# Patient Record
Sex: Female | Born: 1945 | Race: Black or African American | Hispanic: No | Marital: Single | Smoking: Former smoker
Health system: Southern US, Community
[De-identification: ages and names within clinical notes are randomized; demographics above are authoritative.]

## PROBLEM LIST (undated history)

## (undated) DIAGNOSIS — Z86010 Personal history of colonic polyps: Secondary | ICD-10-CM

## (undated) DIAGNOSIS — T7840XA Allergy, unspecified, initial encounter: Secondary | ICD-10-CM

## (undated) DIAGNOSIS — M6282 Rhabdomyolysis: Secondary | ICD-10-CM

## (undated) DIAGNOSIS — M1990 Unspecified osteoarthritis, unspecified site: Secondary | ICD-10-CM

## (undated) DIAGNOSIS — I10 Essential (primary) hypertension: Secondary | ICD-10-CM

## (undated) DIAGNOSIS — R531 Weakness: Secondary | ICD-10-CM

## (undated) DIAGNOSIS — R269 Unspecified abnormalities of gait and mobility: Secondary | ICD-10-CM

## (undated) HISTORY — DX: Unspecified osteoarthritis, unspecified site: M19.90

## (undated) HISTORY — PX: BREAST EXCISIONAL BIOPSY: SUR124

## (undated) HISTORY — DX: Personal history of colonic polyps: Z86.010

## (undated) HISTORY — DX: Rhabdomyolysis: M62.82

## (undated) HISTORY — PX: PARTIAL HYSTERECTOMY: SHX80

## (undated) HISTORY — DX: Unspecified abnormalities of gait and mobility: R26.9

## (undated) HISTORY — DX: Allergy, unspecified, initial encounter: T78.40XA

## (undated) HISTORY — PX: FOOT SURGERY: SHX648

## (undated) HISTORY — PX: COLONOSCOPY: SHX174

## (undated) HISTORY — DX: Essential (primary) hypertension: I10

## (undated) HISTORY — DX: Weakness: R53.1

---

## 1974-10-29 HISTORY — PX: BREAST BIOPSY: SHX20

## 1998-01-07 ENCOUNTER — Ambulatory Visit (HOSPITAL_COMMUNITY): Admission: RE | Admit: 1998-01-07 | Discharge: 1998-01-07 | Payer: Self-pay | Admitting: Emergency Medicine

## 1999-02-03 ENCOUNTER — Ambulatory Visit (HOSPITAL_COMMUNITY): Admission: RE | Admit: 1999-02-03 | Discharge: 1999-02-03 | Payer: Self-pay | Admitting: Emergency Medicine

## 1999-02-03 ENCOUNTER — Encounter: Payer: Self-pay | Admitting: Emergency Medicine

## 2000-02-07 ENCOUNTER — Ambulatory Visit (HOSPITAL_COMMUNITY): Admission: RE | Admit: 2000-02-07 | Discharge: 2000-02-07 | Payer: Self-pay | Admitting: Emergency Medicine

## 2000-02-07 ENCOUNTER — Encounter: Payer: Self-pay | Admitting: Emergency Medicine

## 2002-03-19 ENCOUNTER — Encounter: Admission: RE | Admit: 2002-03-19 | Discharge: 2002-03-19 | Payer: Self-pay | Admitting: Emergency Medicine

## 2002-03-19 ENCOUNTER — Encounter: Payer: Self-pay | Admitting: Emergency Medicine

## 2003-06-28 ENCOUNTER — Encounter: Payer: Self-pay | Admitting: Emergency Medicine

## 2003-06-28 ENCOUNTER — Encounter: Admission: RE | Admit: 2003-06-28 | Discharge: 2003-06-28 | Payer: Self-pay | Admitting: Emergency Medicine

## 2004-10-18 ENCOUNTER — Encounter: Admission: RE | Admit: 2004-10-18 | Discharge: 2004-10-18 | Payer: Self-pay | Admitting: Emergency Medicine

## 2005-10-24 ENCOUNTER — Encounter: Admission: RE | Admit: 2005-10-24 | Discharge: 2005-10-24 | Payer: Self-pay | Admitting: Emergency Medicine

## 2006-10-25 ENCOUNTER — Encounter: Admission: RE | Admit: 2006-10-25 | Discharge: 2006-10-25 | Payer: Self-pay | Admitting: Emergency Medicine

## 2007-10-27 ENCOUNTER — Encounter: Admission: RE | Admit: 2007-10-27 | Discharge: 2007-10-27 | Payer: Self-pay | Admitting: Emergency Medicine

## 2007-11-15 ENCOUNTER — Emergency Department (HOSPITAL_COMMUNITY): Admission: EM | Admit: 2007-11-15 | Discharge: 2007-11-15 | Payer: Self-pay | Admitting: Family Medicine

## 2007-11-16 ENCOUNTER — Emergency Department (HOSPITAL_COMMUNITY): Admission: EM | Admit: 2007-11-16 | Discharge: 2007-11-16 | Payer: Self-pay | Admitting: Emergency Medicine

## 2008-10-27 ENCOUNTER — Encounter: Admission: RE | Admit: 2008-10-27 | Discharge: 2008-10-27 | Payer: Self-pay | Admitting: Emergency Medicine

## 2009-02-25 ENCOUNTER — Encounter (INDEPENDENT_AMBULATORY_CARE_PROVIDER_SITE_OTHER): Payer: Self-pay | Admitting: *Deleted

## 2009-04-11 ENCOUNTER — Ambulatory Visit: Payer: Self-pay | Admitting: Internal Medicine

## 2009-04-25 ENCOUNTER — Ambulatory Visit: Payer: Self-pay | Admitting: Internal Medicine

## 2009-04-28 ENCOUNTER — Encounter: Payer: Self-pay | Admitting: Internal Medicine

## 2009-10-31 ENCOUNTER — Encounter: Admission: RE | Admit: 2009-10-31 | Discharge: 2009-10-31 | Payer: Self-pay | Admitting: Internal Medicine

## 2010-11-06 ENCOUNTER — Encounter
Admission: RE | Admit: 2010-11-06 | Discharge: 2010-11-06 | Payer: Self-pay | Source: Home / Self Care | Attending: Internal Medicine | Admitting: Internal Medicine

## 2011-10-03 ENCOUNTER — Other Ambulatory Visit: Payer: Self-pay | Admitting: Internal Medicine

## 2011-10-03 DIAGNOSIS — Z1231 Encounter for screening mammogram for malignant neoplasm of breast: Secondary | ICD-10-CM

## 2011-11-08 ENCOUNTER — Ambulatory Visit: Payer: Self-pay

## 2011-11-13 ENCOUNTER — Other Ambulatory Visit: Payer: Self-pay | Admitting: Obstetrics and Gynecology

## 2011-11-16 ENCOUNTER — Ambulatory Visit
Admission: RE | Admit: 2011-11-16 | Discharge: 2011-11-16 | Disposition: A | Discharge status: Home / Self Care | Payer: Medicare HMO | Source: Ambulatory Visit | Attending: Internal Medicine | Admitting: Internal Medicine

## 2011-11-16 DIAGNOSIS — Z1231 Encounter for screening mammogram for malignant neoplasm of breast: Secondary | ICD-10-CM

## 2012-10-15 ENCOUNTER — Other Ambulatory Visit: Payer: Self-pay | Admitting: Internal Medicine

## 2012-10-15 DIAGNOSIS — Z1231 Encounter for screening mammogram for malignant neoplasm of breast: Secondary | ICD-10-CM

## 2012-11-07 ENCOUNTER — Other Ambulatory Visit: Payer: Self-pay | Admitting: Internal Medicine

## 2012-11-07 DIAGNOSIS — N951 Menopausal and female climacteric states: Secondary | ICD-10-CM

## 2012-11-18 ENCOUNTER — Ambulatory Visit: Payer: Medicare HMO

## 2012-12-05 ENCOUNTER — Ambulatory Visit
Admission: RE | Admit: 2012-12-05 | Discharge: 2012-12-05 | Disposition: A | Discharge status: Home / Self Care | Payer: Medicare PPO | Source: Ambulatory Visit | Attending: Internal Medicine | Admitting: Internal Medicine

## 2012-12-05 DIAGNOSIS — Z1231 Encounter for screening mammogram for malignant neoplasm of breast: Secondary | ICD-10-CM

## 2012-12-05 DIAGNOSIS — N951 Menopausal and female climacteric states: Secondary | ICD-10-CM

## 2013-11-11 ENCOUNTER — Other Ambulatory Visit: Payer: Self-pay

## 2013-11-11 DIAGNOSIS — Z1231 Encounter for screening mammogram for malignant neoplasm of breast: Secondary | ICD-10-CM

## 2013-12-07 ENCOUNTER — Ambulatory Visit: Payer: Medicare PPO

## 2013-12-17 ENCOUNTER — Ambulatory Visit
Admission: RE | Admit: 2013-12-17 | Discharge: 2013-12-17 | Disposition: A | Discharge status: Home / Self Care | Payer: Commercial Managed Care - HMO | Source: Ambulatory Visit

## 2013-12-17 DIAGNOSIS — Z1231 Encounter for screening mammogram for malignant neoplasm of breast: Secondary | ICD-10-CM

## 2014-06-02 ENCOUNTER — Encounter: Payer: Self-pay | Admitting: Internal Medicine

## 2014-06-28 ENCOUNTER — Encounter: Payer: Self-pay | Admitting: Internal Medicine

## 2014-09-08 ENCOUNTER — Ambulatory Visit (AMBULATORY_SURGERY_CENTER): Payer: Self-pay | Admitting: *Deleted

## 2014-09-08 VITALS — Ht 60.0 in | Wt 172.2 lb

## 2014-09-08 DIAGNOSIS — Z860100 Personal history of colon polyps, unspecified: Secondary | ICD-10-CM

## 2014-09-08 NOTE — Progress Notes (Signed)
No egg or soy allergy. ewm No diet pills. ewm  No blood thinners. ewm No issues with past sedation. ewm

## 2014-09-20 ENCOUNTER — Encounter: Payer: Self-pay | Admitting: Internal Medicine

## 2014-09-20 ENCOUNTER — Ambulatory Visit (AMBULATORY_SURGERY_CENTER): Payer: Commercial Managed Care - HMO | Admitting: Internal Medicine

## 2014-09-20 VITALS — BP 131/95 | HR 62 | Temp 98.9°F | Resp 14 | Ht 60.0 in | Wt 172.0 lb

## 2014-09-20 DIAGNOSIS — Z860100 Personal history of colon polyps, unspecified: Secondary | ICD-10-CM

## 2014-09-20 DIAGNOSIS — D120 Benign neoplasm of cecum: Secondary | ICD-10-CM

## 2014-09-20 DIAGNOSIS — K5730 Diverticulosis of large intestine without perforation or abscess without bleeding: Secondary | ICD-10-CM

## 2014-09-20 DIAGNOSIS — D122 Benign neoplasm of ascending colon: Secondary | ICD-10-CM

## 2014-09-20 DIAGNOSIS — D125 Benign neoplasm of sigmoid colon: Secondary | ICD-10-CM

## 2014-09-20 DIAGNOSIS — D124 Benign neoplasm of descending colon: Secondary | ICD-10-CM

## 2014-09-20 DIAGNOSIS — D123 Benign neoplasm of transverse colon: Secondary | ICD-10-CM

## 2014-09-20 DIAGNOSIS — K635 Polyp of colon: Secondary | ICD-10-CM

## 2014-09-20 MED ORDER — SODIUM CHLORIDE 0.9 % IV SOLN: 500.0000 mL | INTRAVENOUS | Status: DC

## 2014-09-20 NOTE — Progress Notes (Signed)
Report to PACU, RN, vss, BBS= Clear.  

## 2014-09-20 NOTE — Op Note (Signed)
[facility]  [address] [place], [address]   COLONOSCOPY PROCEDURE REPORT  PATIENT: [name], Landing  MR#: [id number] BIRTHDATE: 04/01/1946 , 53  yrs. old GENDER: female ENDOSCOPIST: Gatha Mayer, MD, [facility] PROCEDURE DATE:  09/20/2014 PROCEDURE:   Colonoscopy with biopsy and Colonoscopy with snare polypectomy First Screening Colonoscopy - Avg.  risk and is 50 yrs.  old or older - No.  Prior Negative Screening - Now for repeat screening. N/A  History of Adenoma - Now for follow-up colonoscopy & has been > or = to 3 yrs.  N/A  Polyps Removed Today? Yes. ASA CLASS:   Class II INDICATIONS:high risk personal history of colonic polyps. MEDICATIONS: Propofol 200 mg IV and Monitored anesthesia care  DESCRIPTION OF PROCEDURE:   After the risks benefits and alternatives of the procedure were thoroughly explained, informed consent was obtained.  The digital rectal exam revealed no abnormalities of the rectum.   The LB PFC-H190 [id number]  endoscope was introduced through the anus and advanced to the cecum, which was identified by both the appendix and ileocecal valve. No adverse events experienced.   The quality of the prep was good, using MiraLax  The instrument was then slowly withdrawn as the colon was fully examined.  COLON FINDINGS: 1) 5 polyps (4 sessile) completely removed: 3 mm cecal polyp w/ cold forceps; 5 mm ascending with cold snare; 7 mm transverse cold snare; 5 mm descending cold snare and 1 cm sigmoid (pedunculated) hot snare.  All sent to pathology.  2) Severe diverticulosis left>right colon.3) Otherwise normal colonoscopy. Retroflexed views revealed no abnormalities. The time to cecum=3 minutes 48 seconds.  Withdrawal time=16 minutes 46 seconds.  The scope was withdrawn and the procedure completed. COMPLICATIONS: There were no immediate complications.  ENDOSCOPIC IMPRESSION: 1) 5 polyps (4 sessile) completely removed: 3 mm cecal polyp w/ cold forceps;  5 mm ascending with cold snare; 7 mm transverse cold snare; 5 mm descending cold snare and 1 cm sigmoid (pedunculated) hot snare. 2) Severe diverticulosis left>right colon. 3) Otherwise normal colonoscopy - good prep    RECOMMENDATIONS: 1.  Hold Aspirin and all other NSAIDS for 2 weeks. 2.  Timing of repeat colonoscopy will be determined by pathology findings.  eSigned:  Gatha Mayer, MD, [facility] 09/20/2014 9:37 AM   cc: The Patient and Maudry Mayhew, MD   PATIENT NAME:  Diane Snow, Diane Snow MR#: [id number]

## 2014-09-20 NOTE — Progress Notes (Signed)
Pt. States she wants to go to work tonight at 2100.  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  Offered work note, she declined.

## 2014-09-20 NOTE — Patient Instructions (Signed)
YOU HAD AN ENDOSCOPIC PROCEDURE TODAY AT THE [facility]: Refer to the procedure report that was given to you for any specific questions about what was found during the examination.  If the procedure report does not answer your questions, please call your gastroenterologist to clarify.  If you requested that your care partner not be given the details of your procedure findings, then the procedure report has been included in a sealed envelope for you to review at your convenience later.  YOU SHOULD EXPECT: Some feelings of bloating in the abdomen. Passage of more gas than usual.  Walking can help get rid of the air that was put into your GI tract during the procedure and reduce the bloating. If you had a lower endoscopy (such as a colonoscopy or flexible sigmoidoscopy) you may notice spotting of blood in your stool or on the toilet paper. If you underwent a bowel prep for your procedure, then you may not have a normal bowel movement for a few days.  DIET: Your first meal following the procedure should be a light meal and then it is ok to progress to your normal diet.  A half-sandwich or bowl of soup is an example of a good first meal.  Heavy or fried foods are harder to digest and may make you feel nauseous or bloated.  Likewise meals heavy in dairy and vegetables can cause extra gas to form and this can also increase the bloating.  Drink plenty of fluids but you should avoid alcoholic beverages for 24 hours.  ACTIVITY: Your care partner should take you home directly after the procedure.  You should plan to take it easy, moving slowly for the rest of the day.  You can resume normal activity the day after the procedure however you should NOT DRIVE or use heavy machinery for 24 hours (because of the sedation medicines used during the test).    SYMPTOMS TO REPORT IMMEDIATELY: A gastroenterologist can be reached at any hour.  During normal business hours, 8:30 AM to 5:00 PM Monday through Friday,  call [phone].  After hours and on weekends, please call the GI answering service at [phone] who will take a message and have the physician on call contact you.   Following lower endoscopy (colonoscopy or flexible sigmoidoscopy):  Excessive amounts of blood in the stool  Significant tenderness or worsening of abdominal pains  Swelling of the abdomen that is new, acute  Fever of 100F or higher  Following upper endoscopy (EGD)  Vomiting of blood or coffee ground material  New chest pain or pain under the shoulder blades  Painful or persistently difficult swallowing  New shortness of breath  Fever of 100F or higher  Black, tarry-looking stools  FOLLOW UP: If any biopsies were taken you will be contacted by phone or by letter within the next 1-3 weeks.  Call your gastroenterologist if you have not heard about the biopsies in 3 weeks.  Our staff will call the home number listed on your records the next business day following your procedure to check on you and address any questions or concerns that you may have at that time regarding the information given to you following your procedure. This is a courtesy call and so if there is no answer at the home number and we have not heard from you through the emergency physician on call, we will assume that you have returned to your regular daily activities without incident.  SIGNATURES/CONFIDENTIALITY: You and/or your care   partner have signed paperwork which will be entered into your electronic medical record.  These signatures attest to the fact that that the information above on your After Visit Summary has been reviewed and is understood.  Full responsibility of the confidentiality of this discharge information lies with you and/or your care-partner.YOU HAD AN ENDOSCOPIC PROCEDURE TODAY AT [facility]: Refer to the procedure report that was given to you for any specific questions about what was found during the examination.   If the procedure report does not answer your questions, please call your gastroenterologist to clarify.  If you requested that your care partner not be given the details of your procedure findings, then the procedure report has been included in a sealed envelope for you to review at your convenience later.  YOU SHOULD EXPECT: Some feelings of bloating in the abdomen. Passage of more gas than usual.  Walking can help get rid of the air that was put into your GI tract during the procedure and reduce the bloating. If you had a lower endoscopy (such as a colonoscopy or flexible sigmoidoscopy) you may notice spotting of blood in your stool or on the toilet paper. If you underwent a bowel prep for your procedure, then you may not have a normal bowel movement for a few days.  DIET: Your first meal following the procedure should be a light meal and then it is ok to progress to your normal diet.  A half-sandwich or bowl of soup is an example of a good first meal.  Heavy or fried foods are harder to digest and may make you feel nauseous or bloated.  Likewise meals heavy in dairy and vegetables can cause extra gas to form and this can also increase the bloating.  Drink plenty of fluids but you should avoid alcoholic beverages for 24 hours.  ACTIVITY: Your care partner should take you home directly after the procedure.  You should plan to take it easy, moving slowly for the rest of the day.  You can resume normal activity the day after the procedure however you should NOT DRIVE or use heavy machinery for 24 hours (because of the sedation medicines used during the test).    SYMPTOMS TO REPORT IMMEDIATELY: A gastroenterologist can be reached at any hour.  During normal business hours, 8:30 AM to 5:00 PM Monday through Friday, call [phone].  After hours and on weekends, please call the GI answering service at [phone] who will take a message and have the physician on call contact you.   Following lower  endoscopy (colonoscopy or flexible sigmoidoscopy):  Excessive amounts of blood in the stool  Significant tenderness or worsening of abdominal pains  Swelling of the abdomen that is new, acute  Fever of 100F or higher   FOLLOW UP: If any biopsies were taken you will be contacted by phone or by letter within the next 1-3 weeks.  Call your gastroenterologist if you have not heard about the biopsies in 3 weeks.  Our staff will call the home number listed on your records the next business day following your procedure to check on you and address any questions or concerns that you may have at that time regarding the information given to you following your procedure. This is a courtesy call and so if there is no answer at the home number and we have not heard from you through the emergency physician on call, we will assume that you have returned to your regular daily  activities without incident.  SIGNATURES/CONFIDENTIALITY: You and/or your care partner have signed paperwork which will be entered into your electronic medical record.  These signatures attest to the fact that that the information above on your After Visit Summary has been reviewed and is understood.  Full responsibility of the confidentiality of this discharge information lies with you and/or your care-partner.  Polyp, diverticulosis information given.  Hold all non steroidal anti-inflammatory medications for 2 weeks.

## 2014-09-20 NOTE — Progress Notes (Signed)
Called to room to assist during endoscopic procedure.  Patient ID and intended procedure confirmed with present staff. Received instructions for my participation in the procedure from the performing physician.  

## 2014-09-21 ENCOUNTER — Telehealth: Payer: Self-pay | Admitting: *Deleted

## 2014-09-21 NOTE — Telephone Encounter (Signed)
  Follow up Call-  Call back number 09/20/2014  Post procedure Call Back phone  # [phone] hm  Permission to leave phone message Yes     Patient questions:  Do you have a fever, pain , or abdominal swelling? No. Pain Score  0 *  Have you tolerated food without any problems? Yes.    Have you been able to return to your normal activities? Yes.    Do you have any questions about your discharge instructions: Diet   No. Medications  No. Follow up visit  No.  Do you have questions or concerns about your Care? No.  Actions: * If pain score is 4 or above: No action needed, pain <4.

## 2014-09-27 ENCOUNTER — Encounter: Payer: Self-pay | Admitting: Internal Medicine

## 2014-09-27 DIAGNOSIS — Z860101 Personal history of adenomatous and serrated colon polyps: Secondary | ICD-10-CM

## 2014-09-27 DIAGNOSIS — Z86010 Personal history of colonic polyps: Secondary | ICD-10-CM

## 2014-09-27 HISTORY — DX: Personal history of colonic polyps: Z86.010

## 2014-09-27 HISTORY — DX: Personal history of adenomatous and serrated colon polyps: Z86.0101

## 2014-09-27 NOTE — Progress Notes (Signed)
Quick Note:  3 small adenomas - repeat colonoscopy 2018 ______

## 2014-11-18 DIAGNOSIS — I10 Essential (primary) hypertension: Secondary | ICD-10-CM | POA: Diagnosis not present

## 2014-11-18 DIAGNOSIS — E785 Hyperlipidemia, unspecified: Secondary | ICD-10-CM | POA: Diagnosis not present

## 2014-11-18 DIAGNOSIS — Z23 Encounter for immunization: Secondary | ICD-10-CM | POA: Diagnosis not present

## 2014-11-18 DIAGNOSIS — E139 Other specified diabetes mellitus without complications: Secondary | ICD-10-CM | POA: Diagnosis not present

## 2014-11-18 DIAGNOSIS — Z1389 Encounter for screening for other disorder: Secondary | ICD-10-CM | POA: Diagnosis not present

## 2014-11-18 DIAGNOSIS — Z0000 Encounter for general adult medical examination without abnormal findings: Secondary | ICD-10-CM | POA: Diagnosis not present

## 2014-11-18 DIAGNOSIS — K5790 Diverticulosis of intestine, part unspecified, without perforation or abscess without bleeding: Secondary | ICD-10-CM | POA: Diagnosis not present

## 2014-12-01 ENCOUNTER — Other Ambulatory Visit: Payer: Self-pay

## 2014-12-01 DIAGNOSIS — Z1231 Encounter for screening mammogram for malignant neoplasm of breast: Secondary | ICD-10-CM

## 2014-12-10 ENCOUNTER — Encounter (INDEPENDENT_AMBULATORY_CARE_PROVIDER_SITE_OTHER): Payer: Self-pay

## 2014-12-10 ENCOUNTER — Ambulatory Visit
Admission: RE | Admit: 2014-12-10 | Discharge: 2014-12-10 | Disposition: A | Discharge status: Home / Self Care | Payer: Commercial Managed Care - HMO | Source: Ambulatory Visit

## 2014-12-10 DIAGNOSIS — Z1231 Encounter for screening mammogram for malignant neoplasm of breast: Secondary | ICD-10-CM

## 2015-03-10 DIAGNOSIS — N909 Noninflammatory disorder of vulva and perineum, unspecified: Secondary | ICD-10-CM | POA: Diagnosis not present

## 2015-03-29 DIAGNOSIS — R05 Cough: Secondary | ICD-10-CM | POA: Diagnosis not present

## 2015-04-28 ENCOUNTER — Other Ambulatory Visit: Payer: Self-pay | Admitting: Nurse Practitioner

## 2015-04-28 DIAGNOSIS — N909 Noninflammatory disorder of vulva and perineum, unspecified: Secondary | ICD-10-CM | POA: Diagnosis not present

## 2015-04-28 DIAGNOSIS — L110 Acquired keratosis follicularis: Secondary | ICD-10-CM | POA: Diagnosis not present

## 2015-05-12 DIAGNOSIS — N907 Vulvar cyst: Secondary | ICD-10-CM | POA: Diagnosis not present

## 2015-05-19 DIAGNOSIS — E876 Hypokalemia: Secondary | ICD-10-CM | POA: Diagnosis not present

## 2015-05-19 DIAGNOSIS — I10 Essential (primary) hypertension: Secondary | ICD-10-CM | POA: Diagnosis not present

## 2015-05-19 DIAGNOSIS — E139 Other specified diabetes mellitus without complications: Secondary | ICD-10-CM | POA: Diagnosis not present

## 2015-05-19 DIAGNOSIS — E785 Hyperlipidemia, unspecified: Secondary | ICD-10-CM | POA: Diagnosis not present

## 2015-05-30 DIAGNOSIS — E876 Hypokalemia: Secondary | ICD-10-CM | POA: Diagnosis not present

## 2015-09-21 DIAGNOSIS — H5210 Myopia, unspecified eye: Secondary | ICD-10-CM | POA: Diagnosis not present

## 2015-09-21 DIAGNOSIS — H524 Presbyopia: Secondary | ICD-10-CM | POA: Diagnosis not present

## 2015-09-29 ENCOUNTER — Encounter: Payer: Self-pay | Admitting: Internal Medicine

## 2015-11-16 ENCOUNTER — Encounter: Payer: Self-pay | Admitting: Internal Medicine

## 2015-11-16 ENCOUNTER — Ambulatory Visit (INDEPENDENT_AMBULATORY_CARE_PROVIDER_SITE_OTHER): Payer: Self-pay | Admitting: Internal Medicine

## 2015-11-16 DIAGNOSIS — L0292 Furuncle, unspecified: Secondary | ICD-10-CM

## 2015-11-16 DIAGNOSIS — I10 Essential (primary) hypertension: Secondary | ICD-10-CM | POA: Insufficient documentation

## 2015-11-16 MED ORDER — CEPHALEXIN 500 MG PO TABS: 500.0000 mg | ORAL_TABLET | Freq: Two times a day (BID) | ORAL | Status: DC

## 2015-11-16 NOTE — Progress Notes (Addendum)
   Subjective:    Patient ID: Diane Snow, female    DOB: [birth year], 70 y.o.   MRN: [id number]  HPI  Started with blackhead on vermilion border, which she was squeezing and messing with about 1 week ago.  This morning, awakened with a 4 mm pustule and some surrounding swelling.  Would like to have the pustule opened and drained.   Not allergic to antibiotics No fever.    Review of Systems     Objective:   Physical Exam  NAD HEENT:  PERRL, EOMI, TMs pearly gray, throat without injection. No gingival swelling. 2mm white pustule with thick roof and mild surrounding swelling and  erythema, right lower lip near right corner of mouth. Area cleaned with alcohol wipe, sterile 18 gauge needle used to unroof pustule with initial drainage of thin white pus and a bit of blood.  With a bit of pressure, a large white plug of thickened pus extracted.  Pt. Tolerated well without complication.  Neck:  Supple, no adenopathy, no thyromegaly         Assessment & Plan:  Small furuncle, right lower lip Warm compresses. If not significantly better with warm compresses and continued drainage, to get Cephalexin 500 mg twice daily filled and take for 5 days.

## 2015-11-16 NOTE — Patient Instructions (Signed)
Warm compresses to right lower lip every 2-4 hour for 20 minutes, If not significant improvement in 24 hours or increased swelling and pain, redness, get started on antibiotic sent into [facility] Call if any concerns.

## 2015-11-18 ENCOUNTER — Other Ambulatory Visit: Payer: Self-pay

## 2015-11-18 DIAGNOSIS — Z1231 Encounter for screening mammogram for malignant neoplasm of breast: Secondary | ICD-10-CM

## 2015-11-24 DIAGNOSIS — Z1389 Encounter for screening for other disorder: Secondary | ICD-10-CM | POA: Diagnosis not present

## 2015-11-24 DIAGNOSIS — I10 Essential (primary) hypertension: Secondary | ICD-10-CM | POA: Diagnosis not present

## 2015-11-24 DIAGNOSIS — R29898 Other symptoms and signs involving the musculoskeletal system: Secondary | ICD-10-CM | POA: Diagnosis not present

## 2015-11-24 DIAGNOSIS — Z0000 Encounter for general adult medical examination without abnormal findings: Secondary | ICD-10-CM | POA: Diagnosis not present

## 2015-11-24 DIAGNOSIS — E139 Other specified diabetes mellitus without complications: Secondary | ICD-10-CM | POA: Diagnosis not present

## 2015-11-24 DIAGNOSIS — E785 Hyperlipidemia, unspecified: Secondary | ICD-10-CM | POA: Diagnosis not present

## 2015-11-29 DIAGNOSIS — Z01419 Encounter for gynecological examination (general) (routine) without abnormal findings: Secondary | ICD-10-CM | POA: Diagnosis not present

## 2015-12-15 ENCOUNTER — Ambulatory Visit
Admission: RE | Admit: 2015-12-15 | Discharge: 2015-12-15 | Disposition: A | Discharge status: Home / Self Care | Payer: Commercial Managed Care - HMO | Source: Ambulatory Visit

## 2015-12-15 DIAGNOSIS — Z1231 Encounter for screening mammogram for malignant neoplasm of breast: Secondary | ICD-10-CM

## 2016-05-24 DIAGNOSIS — I10 Essential (primary) hypertension: Secondary | ICD-10-CM | POA: Diagnosis not present

## 2016-05-24 DIAGNOSIS — E139 Other specified diabetes mellitus without complications: Secondary | ICD-10-CM | POA: Diagnosis not present

## 2016-05-24 DIAGNOSIS — E785 Hyperlipidemia, unspecified: Secondary | ICD-10-CM | POA: Diagnosis not present

## 2016-07-23 DIAGNOSIS — L219 Seborrheic dermatitis, unspecified: Secondary | ICD-10-CM | POA: Diagnosis not present

## 2016-08-20 DIAGNOSIS — L219 Seborrheic dermatitis, unspecified: Secondary | ICD-10-CM | POA: Diagnosis not present

## 2016-08-20 DIAGNOSIS — L821 Other seborrheic keratosis: Secondary | ICD-10-CM | POA: Diagnosis not present

## 2016-08-20 DIAGNOSIS — L304 Erythema intertrigo: Secondary | ICD-10-CM | POA: Diagnosis not present

## 2016-11-15 ENCOUNTER — Other Ambulatory Visit: Payer: Self-pay | Admitting: Internal Medicine

## 2016-11-15 DIAGNOSIS — Z1231 Encounter for screening mammogram for malignant neoplasm of breast: Secondary | ICD-10-CM

## 2016-11-16 DIAGNOSIS — H524 Presbyopia: Secondary | ICD-10-CM | POA: Diagnosis not present

## 2016-12-04 DIAGNOSIS — E785 Hyperlipidemia, unspecified: Secondary | ICD-10-CM | POA: Diagnosis not present

## 2016-12-04 DIAGNOSIS — J069 Acute upper respiratory infection, unspecified: Secondary | ICD-10-CM | POA: Diagnosis not present

## 2016-12-04 DIAGNOSIS — Z0000 Encounter for general adult medical examination without abnormal findings: Secondary | ICD-10-CM | POA: Diagnosis not present

## 2016-12-04 DIAGNOSIS — E139 Other specified diabetes mellitus without complications: Secondary | ICD-10-CM | POA: Diagnosis not present

## 2016-12-04 DIAGNOSIS — I10 Essential (primary) hypertension: Secondary | ICD-10-CM | POA: Diagnosis not present

## 2016-12-04 DIAGNOSIS — Z1389 Encounter for screening for other disorder: Secondary | ICD-10-CM | POA: Diagnosis not present

## 2016-12-17 ENCOUNTER — Ambulatory Visit: Payer: Commercial Managed Care - HMO

## 2016-12-17 ENCOUNTER — Ambulatory Visit
Admission: RE | Admit: 2016-12-17 | Discharge: 2016-12-17 | Disposition: A | Discharge status: Home / Self Care | Payer: Commercial Managed Care - HMO | Source: Ambulatory Visit | Attending: Internal Medicine | Admitting: Internal Medicine

## 2016-12-17 DIAGNOSIS — Z1231 Encounter for screening mammogram for malignant neoplasm of breast: Secondary | ICD-10-CM

## 2017-06-20 DIAGNOSIS — M545 Low back pain: Secondary | ICD-10-CM | POA: Diagnosis not present

## 2017-06-20 DIAGNOSIS — I10 Essential (primary) hypertension: Secondary | ICD-10-CM | POA: Diagnosis not present

## 2017-06-20 DIAGNOSIS — E785 Hyperlipidemia, unspecified: Secondary | ICD-10-CM | POA: Diagnosis not present

## 2017-06-20 DIAGNOSIS — R7301 Impaired fasting glucose: Secondary | ICD-10-CM | POA: Diagnosis not present

## 2017-07-22 ENCOUNTER — Ambulatory Visit (HOSPITAL_COMMUNITY)
Admission: EM | Admit: 2017-07-22 | Discharge: 2017-07-22 | Disposition: A | Discharge status: Home / Self Care | Payer: Commercial Managed Care - HMO | Attending: Urgent Care | Admitting: Urgent Care

## 2017-07-22 ENCOUNTER — Encounter (HOSPITAL_COMMUNITY): Payer: Self-pay | Admitting: Emergency Medicine

## 2017-07-22 DIAGNOSIS — W19XXXA Unspecified fall, initial encounter: Secondary | ICD-10-CM

## 2017-07-22 DIAGNOSIS — S0990XA Unspecified injury of head, initial encounter: Secondary | ICD-10-CM | POA: Diagnosis not present

## 2017-07-22 NOTE — Discharge Instructions (Signed)
You may take 500mg Tylenol every 6 hours for pain and inflammation. ° °

## 2017-07-22 NOTE — ED Triage Notes (Signed)
PT reports she was standing on loose rocks and they moved under her feet causing her to fall backwards. PT reports no pain anywhere. PT does not take a blood thinner. PT had no LOC

## 2017-07-23 NOTE — ED Provider Notes (Signed)
    MRN: [id number] DOB: Jun 19, 1946  Subjective:   Diane Snow is a 71 y.o. female presenting for chief complaint of Fall  Reports suffering a fall yesterday by slipping from large wet rocks. States that she made impact with the back of her head and wants to make sure she is okay. Denies loss of consciousness, dizziness, confusion, head pain, laceration, bleeding, neck pain, blurred vision, n/v, abdominal pain, swelling. She has not tried any medications for relief.   No current facility-administered medications for this encounter.   Current Outpatient Prescriptions:  .  amLODipine (NORVASC) 10 MG tablet, Take 10 mg by mouth daily., Disp: , Rfl:  .  aspirin 81 MG tablet, Take 81 mg by mouth daily., Disp: , Rfl:  .  calcium carbonate (TUMS - DOSED IN MG ELEMENTAL CALCIUM) 500 MG chewable tablet, Chew 1 tablet by mouth daily., Disp: , Rfl:  .  Cephalexin 500 MG tablet, Take 1 tablet (500 mg total) by mouth 2 (two) times daily with a meal., Disp: 10 tablet, Rfl: 0 .  famotidine (PEPCID) 20 MG tablet, Take 20 mg by mouth 2 (two) times daily., Disp: , Rfl:  .  indapamide (LOZOL) 1.25 MG tablet, Take 1.25 mg by mouth daily. Reported on 11/16/2015, Disp: , Rfl:    [name] is allergic to aspirin.  Saraiya  has a past medical history of Allergy; Arthritis; adenomatous colonic polyps (09/27/2014); and Hypertension. Also  has a past surgical history that includes Colonoscopy; Partial hysterectomy; and Breast biopsy (Left, 1976).  Objective:   Vitals: BP (!) 161/72   Pulse 61   Temp 98.4 F (36.9 C) (Oral)   Resp 16   Ht 5' (1.524 m)   Wt 167 lb (75.8 kg)   SpO2 100%   BMI 32.61 kg/m   Physical Exam  Constitutional: She is oriented to person, place, and time. She appears well-developed and well-nourished.  HENT:  Head: Head is without raccoon's eyes, without Battle's sign, without abrasion, without contusion and without laceration. Hair is normal.  Mouth/Throat: Oropharynx is clear and moist.   Eyes: Pupils are equal, round, and reactive to light. EOM are normal. Right eye exhibits no discharge. Left eye exhibits no discharge.  Neck: Normal range of motion. Neck supple.  Cardiovascular: Normal rate, regular rhythm and intact distal pulses.  Exam reveals no gallop and no friction rub.   No murmur heard. Pulmonary/Chest: No respiratory distress. She has no wheezes. She has no rales.  Musculoskeletal: She exhibits no edema.       Cervical back: She exhibits normal range of motion, no tenderness, no bony tenderness, no swelling, no edema, no deformity, no laceration and no spasm.  Lymphadenopathy:    She has no cervical adenopathy.  Neurological: She is alert and oriented to person, place, and time. She displays normal reflexes. No cranial nerve deficit. Coordination normal.  Speech intact. Negative Romberg and Pronator Drift. Strength 5/5.  Skin: Skin is warm and dry.  Psychiatric: She has a normal mood and affect.   Assessment and Plan :   Fall, initial encounter  Minor head injury, initial encounter  Recommended conservative management. Return-to-clinic precautions discussed, patient verbalized understanding.   Jaynee Eagles, PA-C [facility]  07/23/2017  1:05 PM    Jaynee Eagles, PA-C 07/23/17 1309

## 2017-10-25 ENCOUNTER — Encounter: Payer: Self-pay | Admitting: Internal Medicine

## 2017-11-20 DIAGNOSIS — H52223 Regular astigmatism, bilateral: Secondary | ICD-10-CM | POA: Diagnosis not present

## 2017-11-25 DIAGNOSIS — Z0100 Encounter for examination of eyes and vision without abnormal findings: Secondary | ICD-10-CM | POA: Diagnosis not present

## 2017-12-06 ENCOUNTER — Encounter: Payer: Self-pay | Admitting: Internal Medicine

## 2017-12-06 ENCOUNTER — Other Ambulatory Visit: Payer: Self-pay | Admitting: Internal Medicine

## 2017-12-06 DIAGNOSIS — Z139 Encounter for screening, unspecified: Secondary | ICD-10-CM

## 2017-12-10 DIAGNOSIS — E7800 Pure hypercholesterolemia, unspecified: Secondary | ICD-10-CM | POA: Diagnosis not present

## 2017-12-10 DIAGNOSIS — I10 Essential (primary) hypertension: Secondary | ICD-10-CM | POA: Diagnosis not present

## 2017-12-10 DIAGNOSIS — Z0000 Encounter for general adult medical examination without abnormal findings: Secondary | ICD-10-CM | POA: Diagnosis not present

## 2017-12-10 DIAGNOSIS — Z1389 Encounter for screening for other disorder: Secondary | ICD-10-CM | POA: Diagnosis not present

## 2017-12-10 DIAGNOSIS — E119 Type 2 diabetes mellitus without complications: Secondary | ICD-10-CM | POA: Diagnosis not present

## 2017-12-10 DIAGNOSIS — E663 Overweight: Secondary | ICD-10-CM | POA: Diagnosis not present

## 2017-12-20 ENCOUNTER — Ambulatory Visit: Payer: Medicare HMO | Admitting: Podiatry

## 2017-12-20 ENCOUNTER — Ambulatory Visit (INDEPENDENT_AMBULATORY_CARE_PROVIDER_SITE_OTHER): Payer: Medicare HMO

## 2017-12-20 ENCOUNTER — Encounter: Payer: Self-pay | Admitting: Podiatry

## 2017-12-20 VITALS — BP 140/79 | HR 70 | Resp 16

## 2017-12-20 DIAGNOSIS — M2010 Hallux valgus (acquired), unspecified foot: Secondary | ICD-10-CM

## 2017-12-20 DIAGNOSIS — L84 Corns and callosities: Secondary | ICD-10-CM | POA: Diagnosis not present

## 2017-12-20 DIAGNOSIS — M2011 Hallux valgus (acquired), right foot: Secondary | ICD-10-CM | POA: Diagnosis not present

## 2017-12-20 DIAGNOSIS — M2012 Hallux valgus (acquired), left foot: Secondary | ICD-10-CM | POA: Diagnosis not present

## 2017-12-20 DIAGNOSIS — Z01419 Encounter for gynecological examination (general) (routine) without abnormal findings: Secondary | ICD-10-CM | POA: Diagnosis not present

## 2017-12-20 DIAGNOSIS — M216X9 Other acquired deformities of unspecified foot: Secondary | ICD-10-CM | POA: Diagnosis not present

## 2017-12-20 NOTE — Progress Notes (Signed)
   Subjective:    Patient ID: Diane Snow, female    DOB: November 28, 1945, 72 y.o.   MRN: [id number]  HPI    Review of Systems  All other systems reviewed and are negative.      Objective:   Physical Exam        Assessment & Plan:

## 2017-12-20 NOTE — Progress Notes (Signed)
Subjective:   Patient ID: Diane Snow, female   DOB: 72 y.o.   MRN: [id number]   HPI Patient states she gets a lot of pain in the bottom of her feet and she has structural bunions that she is not sure as part of this and tries to wear wider shoes to accommodate them.  States is been going on for a number of years.  Patient does not smoke and likes to be active   Review of Systems  All other systems reviewed and are negative.       Objective:  Physical Exam  Constitutional: She appears well-developed and well-nourished.  Cardiovascular: Intact distal pulses.  Pulmonary/Chest: Effort normal.  Musculoskeletal: Normal range of motion.  Neurological: She is alert.  Skin: Skin is warm.  Nursing note and vitals reviewed.   Neurovascular status was found to be intact muscle strength adequate with high cavus foot structure with inflammation and discomfort across the metatarsals bilateral.  Patient has moderate structural bunion deformity noted bilateral and has inflammation around the second metatarsal phalangeal joint bilateral.  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  At this point I recommended a soft type orthotic to try to diffuse weight off the metatarsals and patient has appear from 2 years ago which have not been effective but she did not bring with her.  She will see our ped orthotist for orthotic casting and will bring her old orthotics and I have recommended a softer type of device with a lot of metatarsal lifting to try to take pressure off the underlying metatarsal heads bilateral  X-rays indicate there is cavus foot structure and there is moderate structural bunion deformity bilateral

## 2017-12-25 ENCOUNTER — Ambulatory Visit: Payer: Medicare HMO

## 2017-12-31 ENCOUNTER — Ambulatory Visit
Admission: RE | Admit: 2017-12-31 | Discharge: 2017-12-31 | Disposition: A | Discharge status: Home / Self Care | Payer: Medicare HMO | Source: Ambulatory Visit | Attending: Internal Medicine | Admitting: Internal Medicine

## 2017-12-31 DIAGNOSIS — Z139 Encounter for screening, unspecified: Secondary | ICD-10-CM

## 2017-12-31 DIAGNOSIS — Z1231 Encounter for screening mammogram for malignant neoplasm of breast: Secondary | ICD-10-CM | POA: Diagnosis not present

## 2018-01-30 ENCOUNTER — Encounter: Payer: Self-pay | Admitting: Podiatry

## 2018-01-30 ENCOUNTER — Ambulatory Visit: Payer: Medicare HMO | Admitting: Podiatry

## 2018-01-30 DIAGNOSIS — M7750 Other enthesopathy of unspecified foot: Secondary | ICD-10-CM | POA: Diagnosis not present

## 2018-01-30 DIAGNOSIS — L84 Corns and callosities: Secondary | ICD-10-CM | POA: Diagnosis not present

## 2018-01-30 DIAGNOSIS — M7752 Other enthesopathy of left foot: Secondary | ICD-10-CM

## 2018-01-30 DIAGNOSIS — M779 Enthesopathy, unspecified: Secondary | ICD-10-CM

## 2018-01-30 MED ORDER — TRIAMCINOLONE ACETONIDE 10 MG/ML IJ SUSP
10.0000 mg | Freq: Once | INTRAMUSCULAR | Status: AC
  Administered 2018-01-30: 10 mg

## 2018-01-30 NOTE — Progress Notes (Signed)
Subjective:   Patient ID: Diane Snow, female   DOB: 72 y.o.   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  Reappoint if symptoms continue

## 2018-02-05 ENCOUNTER — Ambulatory Visit (INDEPENDENT_AMBULATORY_CARE_PROVIDER_SITE_OTHER): Payer: Medicare HMO

## 2018-02-05 ENCOUNTER — Ambulatory Visit (INDEPENDENT_AMBULATORY_CARE_PROVIDER_SITE_OTHER): Payer: Medicare HMO | Admitting: Orthopaedic Surgery

## 2018-02-05 ENCOUNTER — Encounter (INDEPENDENT_AMBULATORY_CARE_PROVIDER_SITE_OTHER): Payer: Self-pay | Admitting: Orthopaedic Surgery

## 2018-02-05 DIAGNOSIS — G8929 Other chronic pain: Secondary | ICD-10-CM

## 2018-02-05 DIAGNOSIS — M25562 Pain in left knee: Secondary | ICD-10-CM

## 2018-02-05 MED ORDER — LIDOCAINE HCL 1 % IJ SOLN
3.0000 mL | INTRAMUSCULAR | Status: AC | PRN
  Administered 2018-02-05: 3 mL

## 2018-02-05 MED ORDER — METHYLPREDNISOLONE ACETATE 40 MG/ML IJ SUSP
40.0000 mg | INTRAMUSCULAR | Status: AC | PRN
  Administered 2018-02-05: 40 mg via INTRA_ARTICULAR

## 2018-02-05 NOTE — Progress Notes (Signed)
Office Visit Note   Patient: Diane Snow           Date of Birth: 01-09-1946           MRN: [id number] Visit Date: 02/05/2018              Requested by: Diane Carol, MD [address] [place], [place] [address] PCP: Diane Carol, MD   Assessment & Plan: Visit Diagnoses:  1. Chronic pain of left knee     Plan: She will work on quad strengthening exercises as shown.  Continue Tylenol and over-the-counter cream for the knee.  Follow-up with [place] in 2 weeks check her response to the left knee injection.  Questions are encouraged and answered at length today by Dr. Ninfa Snow and myself.  Follow-Up Instructions: Return in about 2 weeks (around 02/19/2018).   Orders:  Orders Placed This Encounter  Procedures  . Large Joint Inj: L knee  . XR KNEE 3 VIEW LEFT   No orders of the defined types were placed in this encounter.     Procedures: Large Joint Inj: L knee on 02/05/2018 3:45 PM Indications: pain Details: 22 G 1.5 in needle, anterolateral approach  Arthrogram: No  Medications: 3 mL lidocaine 1 %; 40 mg methylPREDNISolone acetate 40 MG/ML Outcome: tolerated well, no immediate complications Procedure, treatment alternatives, risks and benefits explained, specific risks discussed. Consent was given by the patient. Immediately prior to procedure a time out was called to verify the correct patient, procedure, equipment, support staff and site/side marked as required. Patient was prepped and draped in the usual sterile fashion.       Clinical Data: No additional findings.   Subjective: Chief Complaint  Patient presents with  . Left Knee - Pain, Follow-up    HPI Diane Snow is a 72 year old female comes in today with left knee pain.  She is had knee pain for the past 3-4 months.  She fell off the bed and thought she may have hurt it at that point time.  Pain just will not go away.  She denies any mechanical symptoms in the knee.  She has occasional aches  pains in both knees prior to this and will use Tylenol or over-the-counter cream for her knees but no significant pain in either knee prior to 3-4 months ago.  No other known injuries to the knees. Review of Systems Please see HPI otherwise negative  Objective: Vital Signs: There were no vitals taken for this visit.  Physical Exam  Constitutional: She is oriented to person, place, and time. She appears well-developed and well-nourished. No distress.  Cardiovascular: Intact distal pulses.  Pulmonary/Chest: Effort normal.  Neurological: She is alert and oriented to person, place, and time.  Skin: She is not diaphoretic.  Psychiatric: She has a normal mood and affect.    Ortho Exam Bilateral knees good range of motion.  She has tenderness along medial joint line of both knees.  No instability valgus varus stressing of either knee.  No abnormal warmth erythema ecchymosis or effusion of either knee. Specialty Comments:  No specialty comments available.  Imaging: Xr Knee 3 View Left  Result Date: 02/05/2018  AP lateral and sunrise view left knee: No acute fracture is well located.  Near bone-on-bone medial compartment.  Moderate to severe patellofemoral changes.  Mild lateral compartmental changes.  Knee is well located.  Right knee seen on the AP view it shows tricompartmental changes.  The sunrise view of the right knee  shows moderate to severe patellofemoral changes with lateralization of the patella in the patellofemoral notch.    PMFS History: Patient Active Problem List   Diagnosis Date Noted  . Essential hypertension 11/16/2015  . Hx of adenomatous colonic polyps 09/27/2014   Past Medical History:  Diagnosis Date  . Allergy   . Arthritis   . Hx of adenomatous colonic polyps 09/27/2014  . Hypertension     Family History  Problem Relation Age of Onset  . Breast cancer Maternal Aunt   . Colon cancer Neg Hx   . Rectal cancer Neg Hx   . Stomach cancer Neg Hx   . Esophageal  cancer Neg Hx   . Pancreatic cancer Neg Hx     Past Surgical History:  Procedure Laterality Date  . BREAST BIOPSY Left 1976  . COLONOSCOPY    . PARTIAL HYSTERECTOMY     Social History   Occupational History  . Not on file  Tobacco Use  . Smoking status: Former Smoker    Last attempt to quit: 03/30/2011    Years since quitting: 6.8  . Smokeless tobacco: Never Used  Substance and Sexual Activity  . Alcohol use: Yes    Alcohol/week: 0.0 oz    Comment: occasionally  . Drug use: No  . Sexual activity: Not on file

## 2018-02-21 ENCOUNTER — Ambulatory Visit (AMBULATORY_SURGERY_CENTER): Payer: Self-pay

## 2018-02-21 ENCOUNTER — Other Ambulatory Visit: Payer: Self-pay

## 2018-02-21 ENCOUNTER — Encounter: Payer: Self-pay | Admitting: Internal Medicine

## 2018-02-21 VITALS — Ht 60.0 in | Wt 168.4 lb

## 2018-02-21 DIAGNOSIS — Z860100 Personal history of colon polyps, unspecified: Secondary | ICD-10-CM

## 2018-02-21 NOTE — Progress Notes (Signed)
Denies allergies to eggs or soy products. Denies complication of anesthesia or sedation. Denies use of weight loss medication. Denies use of O2.   Emmi instructions declined.  

## 2018-03-07 ENCOUNTER — Encounter: Payer: Self-pay | Admitting: Internal Medicine

## 2018-03-07 ENCOUNTER — Other Ambulatory Visit: Payer: Self-pay

## 2018-03-07 ENCOUNTER — Ambulatory Visit (AMBULATORY_SURGERY_CENTER): Payer: Medicare HMO | Admitting: Internal Medicine

## 2018-03-07 VITALS — BP 128/86 | HR 58 | Temp 99.1°F | Resp 13 | Ht 60.0 in | Wt 168.0 lb

## 2018-03-07 DIAGNOSIS — K635 Polyp of colon: Secondary | ICD-10-CM

## 2018-03-07 DIAGNOSIS — D123 Benign neoplasm of transverse colon: Secondary | ICD-10-CM | POA: Diagnosis not present

## 2018-03-07 DIAGNOSIS — D125 Benign neoplasm of sigmoid colon: Secondary | ICD-10-CM | POA: Diagnosis not present

## 2018-03-07 DIAGNOSIS — M129 Arthropathy, unspecified: Secondary | ICD-10-CM | POA: Diagnosis not present

## 2018-03-07 DIAGNOSIS — I10 Essential (primary) hypertension: Secondary | ICD-10-CM | POA: Diagnosis not present

## 2018-03-07 DIAGNOSIS — Z86010 Personal history of colonic polyps: Secondary | ICD-10-CM

## 2018-03-07 MED ORDER — SODIUM CHLORIDE 0.9 % IV SOLN: 500.0000 mL | Freq: Once | INTRAVENOUS | Status: DC

## 2018-03-07 NOTE — Progress Notes (Signed)
Called to room to assist during endoscopic procedure.  Patient ID and intended procedure confirmed with present staff. Received instructions for my participation in the procedure from the performing physician.  

## 2018-03-07 NOTE — Patient Instructions (Addendum)
I removed 2 tiny polyps.  There might have been some polyps in the sigmoid (lower colon) though I think may be redness and inflammation from the colonoscopy prep. It was difficult to work in this area, a lot of diverticulosis 9as before) - I took biopsies. There is a tiny chance I may need to go back to that area.  I will let you know results and plans.  I appreciate the opportunity to care for you. Gatha Mayer, MD, FACG  YOU HAD AN ENDOSCOPIC PROCEDURE TODAY AT [facility]:   Refer to the procedure report that was given to you for any specific questions about what was found during the examination.  If the procedure report does not answer your questions, please call your gastroenterologist to clarify.  If you requested that your care partner not be given the details of your procedure findings, then the procedure report has been included in a sealed envelope for you to review at your convenience later.  YOU SHOULD EXPECT: Some feelings of bloating in the abdomen. Passage of more gas than usual.  Walking can help get rid of the air that was put into your GI tract during the procedure and reduce the bloating. If you had a lower endoscopy (such as a colonoscopy or flexible sigmoidoscopy) you may notice spotting of blood in your stool or on the toilet paper. If you underwent a bowel prep for your procedure, you may not have a normal bowel movement for a few days.  Please Note:  You might notice some irritation and congestion in your nose or some drainage.  This is from the oxygen used during your procedure.  There is no need for concern and it should clear up in a day or so.  SYMPTOMS TO REPORT IMMEDIATELY:   Following lower endoscopy (colonoscopy or flexible sigmoidoscopy):  Excessive amounts of blood in the stool  Significant tenderness or worsening of abdominal pains  Swelling of the abdomen that is new, acute  Fever of 100F or higher   For urgent or emergent  issues, a gastroenterologist can be reached at any hour by calling [phone].   DIET:  We do recommend a small meal at first, but then you may proceed to your regular diet.  Drink plenty of fluids but you should avoid alcoholic beverages for 24 hours.  ACTIVITY:  You should plan to take it easy for the rest of today and you should NOT DRIVE or use heavy machinery until tomorrow (because of the sedation medicines used during the test).    FOLLOW UP: Our staff will call the number listed on your records the next business day following your procedure to check on you and address any questions or concerns that you may have regarding the information given to you following your procedure. If we do not reach you, we will leave a message.  However, if you are feeling well and you are not experiencing any problems, there is no need to return our call.  We will assume that you have returned to your regular daily activities without incident.  If any biopsies were taken you will be contacted by phone or by letter within the next 1-3 weeks.  Please call us at [phone] if you have not heard about the biopsies in 3 weeks.    SIGNATURES/CONFIDENTIALITY: You and/or your care partner have signed paperwork which will be entered into your electronic medical record.  These signatures attest to the fact that that  the information above on your After Visit Summary has been reviewed and is understood.  Full responsibility of the confidentiality of this discharge information lies with you and/or your care-partner.  Polyp, diverticulosis information given.

## 2018-03-07 NOTE — Progress Notes (Signed)
Pt's states no medical or surgical changes since previsit or office visit. 

## 2018-03-07 NOTE — Op Note (Signed)
[facility] [facility] Patient Name: Diane Snow Procedure Date: 03/07/2018 11:06 AM MRN: [id number] Endoscopist: Gatha Mayer , MD Age: 72 Referring MD:  Date of Birth: 07-27-1946 Gender: Female Account #: [id number] Procedure:                Colonoscopy Indications:              Surveillance: Personal history of adenomatous                            polyps on last colonoscopy > 3 years ago Medicines:                Propofol per Anesthesia, Monitored Anesthesia Care Procedure:                Pre-Anesthesia Assessment:                           - Prior to the procedure, a History and Physical                            was performed, and patient medications and                            allergies were reviewed. The patient's tolerance of                            previous anesthesia was also reviewed. The risks                            and benefits of the procedure and the sedation                            options and risks were discussed with the patient.                            All questions were answered, and informed consent                            was obtained. Prior Anticoagulants: The patient has                            taken no previous anticoagulant or antiplatelet                            agents. ASA Grade Assessment: II - A patient with                            mild systemic disease. After reviewing the risks                            and benefits, the patient was deemed in                            satisfactory condition to undergo the procedure.  After obtaining informed consent, the colonoscope                            was passed under direct vision. Throughout the                            procedure, the patient's blood pressure, pulse, and                            oxygen saturations were monitored continuously. The                            Colonoscope was introduced through the anus and   advanced to the the cecum, identified by                            appendiceal orifice and ileocecal valve. The                            colonoscopy was technically difficult and complex                            due to poor endoscopic visualization. Successful                            completion of the procedure was aided by closing                            anus manually. The patient tolerated the procedure                            well. The quality of the bowel preparation was                            good. The ileocecal valve, appendiceal orifice, and                            rectum were photographed. The bowel preparation                            used was Miralax. Scope In: 11:19:00 AM Scope Out: 11:50:18 AM Scope Withdrawal Time: 0 hours 27 minutes 54 seconds  Total Procedure Duration: 0 hours 31 minutes 18 seconds  Findings:                 The perianal examination was normal.                           The digital rectal exam findings include decreased                            sphincter tone.                           Two sessile polyps were found in the transverse  colon. The polyps were diminutive in size. These                            polyps were removed with a cold snare. Resection                            and retrieval were complete. Verification of                            patient identification for the specimen was done.                            Estimated blood loss was minimal.                           A localized area of erythematous polypoid mucosa                            was found in the distal sigmoid colon. Cold snare,                            hot snare partial removal and separate biopsies                            were taken with a cold forceps for histology. Spasm                            and leakage of air + edema made it difficult to see                            and work in this area.                            Multiple diverticula were found in the sigmoid                            colon. There was evidence of diverticular spasm.                           The exam was otherwise without abnormality on                            direct and retroflexion views. Complications:            No immediate complications. Estimated Blood Loss:     Estimated blood loss was minimal. Impression:               - Decreased sphincter tone found on digital rectal                            exam.                           - Two diminutive polyps in the transverse colon,  removed with a cold snare. Resected and retrieved.                           - Erythematous polypoid mucosa in the distal                            sigmoid colon. Biopsied.                           - Severe diverticulosis in the sigmoid colon. There                            was evidence of diverticular spasm.                           - The examination was otherwise normal on direct                            and retroflexion views.                           - Personal history of colonic polyps. adenomas,                            last exam 2015 Recommendation:           - Patient has a contact number available for                            emergencies. The signs and symptoms of potential                            delayed complications were discussed with the                            patient. Return to normal activities tomorrow.                            Written discharge instructions were provided to the                            patient.                           - Continue present medications.                           - Repeat colonoscopy is recommended for                            surveillance. The colonoscopy date will be                            determined after pathology results from today's  exam become available for review. Gatha Mayer, MD 03/07/2018  11:58:27 AM This report has been signed electronically.

## 2018-03-07 NOTE — Progress Notes (Signed)
Report to RN, VSS, adequate respirations noted, no c/o pain or discomfort 

## 2018-03-10 ENCOUNTER — Telehealth: Payer: Self-pay

## 2018-03-10 NOTE — Telephone Encounter (Signed)
  Follow up Call-  Call back number 03/07/2018  Post procedure Call Back phone  # [phone]  Permission to leave phone message Yes  Some recent data might be hidden     Patient questions:  Do you have a fever, pain , or abdominal swelling? No. Pain Score  0 *  Have you tolerated food without any problems? Yes.    Have you been able to return to your normal activities? Yes.    Do you have any questions about your discharge instructions: Diet   No. Medications  No. Follow up visit  No.  Do you have questions or concerns about your Care? No.  Actions: * If pain score is 4 or above: No action needed, pain <4.  No problems noted per pt. maw

## 2018-03-12 ENCOUNTER — Encounter: Payer: Self-pay | Admitting: Internal Medicine

## 2018-03-12 DIAGNOSIS — Z86010 Personal history of colonic polyps: Secondary | ICD-10-CM

## 2018-04-17 ENCOUNTER — Encounter: Payer: Self-pay | Admitting: Podiatry

## 2018-04-17 ENCOUNTER — Ambulatory Visit: Payer: Medicare HMO | Admitting: Podiatry

## 2018-04-17 DIAGNOSIS — M21622 Bunionette of left foot: Secondary | ICD-10-CM

## 2018-04-17 DIAGNOSIS — Q828 Other specified congenital malformations of skin: Secondary | ICD-10-CM

## 2018-04-17 NOTE — Progress Notes (Signed)
Subjective:   Patient ID: Diane Snow, female   DOB: 72 y.o.   MRN: [id number]   HPI Patient presents with a lesion underneath the left fifth metatarsal stating it got better for around 6 weeks and she knows she needs surgery but she like to wait until September   ROS      Objective:  Physical Exam  Neurovascular status intact with patient found to have exquisite pain in the left fifth metatarsal head with keratotic lesion that is painful when palpated with lucent core     Assessment:  Porokeratotic lesion with plantarflexed fifth metatarsal with pain around the fifth metatarsal head     Plan:  H&P condition reviewed and today I debrided the lesion with no iatrogenic bleeding advised on padding of the area and discussed long-term fifth metatarsal head resection which she will do in the fall

## 2018-05-09 ENCOUNTER — Encounter (HOSPITAL_COMMUNITY): Payer: Self-pay

## 2018-05-09 ENCOUNTER — Ambulatory Visit (HOSPITAL_COMMUNITY)
Admission: EM | Admit: 2018-05-09 | Discharge: 2018-05-09 | Disposition: A | Discharge status: Home / Self Care | Payer: Medicare HMO | Attending: Family Medicine | Admitting: Family Medicine

## 2018-05-09 DIAGNOSIS — R600 Localized edema: Secondary | ICD-10-CM | POA: Diagnosis not present

## 2018-05-09 DIAGNOSIS — M1712 Unilateral primary osteoarthritis, left knee: Secondary | ICD-10-CM

## 2018-05-09 DIAGNOSIS — G8929 Other chronic pain: Secondary | ICD-10-CM | POA: Diagnosis not present

## 2018-05-09 DIAGNOSIS — M25562 Pain in left knee: Secondary | ICD-10-CM

## 2018-05-09 NOTE — ED Provider Notes (Signed)
[place]    CSN: [id number] Arrival date & time: 05/09/18  1142     History   Chief Complaint Chief Complaint  Patient presents with  . left leg swelling    HPI Diane Snow is a 72 y.o. female.   HPI  She has knee pain for the last 3 to 4 days.  Is worse than usual.  No accident or injury.  No overuse.  It goes from her knee all the way down to her calf.  She thinks is increased swelling in her left ankle.  To my exam they are both swollen. It is reviewed.  She saw Dr. Ninfa Linden for her knee in April of this year.  She had x-rays done.  She has bone-on-bone arthritis in his knee.  He tried an injection.  It did not work.  He told her to come back in 2 weeks.  She did not.  Her family doctor has been giving her Relafen for years for chronic knee pain.  She has not been taking this for the last couple days.  She did try Aleve. The knee hurts with weightbearing.  Better with rest.  No buckling locking instability or falls  Past Medical History:  Diagnosis Date  . Allergy   . Arthritis   . Hx of adenomatous colonic polyps 09/27/2014  . Hypertension     Patient Active Problem List   Diagnosis Date Noted  . Essential hypertension 11/16/2015  . Hx of adenomatous colonic polyps 09/27/2014    Past Surgical History:  Procedure Laterality Date  . BREAST BIOPSY Left 1976  . COLONOSCOPY    . PARTIAL HYSTERECTOMY      OB History   None      Home Medications    Prior to Admission medications   Medication Sig Start Date End Date Taking? Authorizing Provider  amLODipine (NORVASC) 10 MG tablet Take 10 mg by mouth daily.    [provider]  aspirin 81 MG tablet Take 81 mg by mouth daily.    [provider]  calcium carbonate (TUMS - DOSED IN MG ELEMENTAL CALCIUM) 500 MG chewable tablet Chew 1 tablet by mouth daily.    [provider]  famotidine (PEPCID) 20 MG tablet Take 20 mg by mouth 2 (two) times daily.    [provider]    loratadine (CLARITIN) 10 MG tablet Take 10 mg by mouth daily.    [provider]    Family History Family History  Problem Relation Age of Onset  . Breast cancer Maternal Aunt   . Colon cancer Neg Hx   . Rectal cancer Neg Hx   . Stomach cancer Neg Hx   . Esophageal cancer Neg Hx   . Pancreatic cancer Neg Hx     Social History Social History   Tobacco Use  . Smoking status: Former Smoker    Last attempt to quit: 03/30/2011    Years since quitting: 7.1  . Smokeless tobacco: Never Used  Substance Use Topics  . Alcohol use: Yes    Alcohol/week: 0.0 oz    Comment: occasionally  . Drug use: No     Allergies   Aspirin   Review of Systems Review of Systems  Constitutional: Negative for chills and fever.  HENT: Negative for ear pain and sore throat.   Eyes: Negative for pain and visual disturbance.  Respiratory: Negative for cough and shortness of breath.   Cardiovascular: Positive for leg swelling. Negative for chest pain  and palpitations.  Gastrointestinal: Negative for abdominal pain and vomiting.  Genitourinary: Negative for dysuria and hematuria.  Musculoskeletal: Positive for arthralgias and gait problem. Negative for back pain.  Skin: Negative for color change and rash.  Neurological: Negative for seizures and syncope.  All other systems reviewed and are negative.    Physical Exam Triage Vital Signs ED Triage Vitals  Enc Vitals Group     BP 05/09/18 1156 (!) 132/91     Pulse Rate 05/09/18 1156 81     Resp 05/09/18 1156 18     Temp 05/09/18 1156 98 F (36.7 C)     Temp Source 05/09/18 1156 Oral     SpO2 05/09/18 1156 98 %     Weight --      Height --      Head Circumference --      Peak Flow --      Pain Score 05/09/18 1200 8     Pain Loc --      Pain Edu? --      Excl. in [place]? --    No data found.  Updated Vital Signs BP (!) 132/91 (BP Location: Left Arm)   Pulse 81   Temp 98 F (36.7 C) (Oral)   Resp 18   SpO2 98%       Physical  Exam  Constitutional: She appears well-developed and well-nourished. No distress.  HENT:  Head: Normocephalic and atraumatic.  Mouth/Throat: Oropharynx is clear and moist.  Eyes: Pupils are equal, round, and reactive to light. Conjunctivae are normal.  Neck: Normal range of motion.  Cardiovascular: Normal rate, regular rhythm and normal heart sounds.  Pulmonary/Chest: Effort normal and breath sounds normal. No respiratory distress.  Abdominal: Soft. She exhibits no distension.  Musculoskeletal: Normal range of motion. She exhibits no edema.  Left knee has trace effusion.  Medial joint line tenderness.  Crepitus with range of motion.  Posterior knee is palpated.  No tenderness.  Calf is palpated and there is no tenderness.  No pain with range of motion of the ankle or Homans sign.  She has trace to 1+ pedal edema bilaterally.  Symmetric.  Distal pulses and cap refill are normal in the foot with normal sensory exam.  Neurological: She is alert.  Skin: Skin is warm and dry.  Psychiatric: She has a normal mood and affect. Her behavior is normal.     UC Treatments / Results  Labs (all labs ordered are listed, but only abnormal results are displayed) Labs Reviewed - No data to display  EKG None  Radiology No results found.  Procedures Procedures (including critical care time)  Medications Ordered in UC Medications - No data to display  Initial Impression / Assessment and Plan / UC Course  I have reviewed the triage vital signs and the nursing notes.  Pertinent labs & imaging results that were available during my care of the patient were reviewed by me and considered in my medical decision making (see chart for details).     Discussed with patient and her sister-in-law that she has end-stage osteoarthritis.  It is not going to respond well to injections.  She should modify her activity when her knee is painful.  She continue taking her Relafen for pain.  She needs to follow-up with  her orthopedic doctor.  We will try giving her a brace for comfort.  No guarantees. Final Clinical Impressions(s) / UC Diagnoses   Final diagnoses:  Primary osteoarthritis of left knee  Chronic  pain of left knee  Pedal edema     Discharge Instructions     You need to see Dr. Ninfa Linden for your knee.  Your arthritis is severe. Continue the Relafen (nabumetone) twice a day Wear brace for comfort Limit walking and weightbearing when he is painful You may try ice or heat to see if this helps Talk to your primary care doctor about your ankle swelling and your blood pressure medication    ED Prescriptions    None     Controlled Substance Prescriptions [place] Controlled Substance Registry consulted? Not Applicable   Raylene Everts, MD 05/09/18 [phone]

## 2018-05-09 NOTE — Discharge Instructions (Signed)
You need to see Dr. Ninfa Linden for your knee.  Your arthritis is severe. Continue the Relafen (nabumetone) twice a day Wear brace for comfort Limit walking and weightbearing when he is painful You may try ice or heat to see if this helps Talk to your primary care doctor about your ankle swelling and your blood pressure medication

## 2018-05-09 NOTE — ED Triage Notes (Signed)
Pt presents with left leg swelling and pain.

## 2018-05-12 ENCOUNTER — Ambulatory Visit (INDEPENDENT_AMBULATORY_CARE_PROVIDER_SITE_OTHER): Payer: Medicare HMO | Admitting: Orthopaedic Surgery

## 2018-05-12 ENCOUNTER — Encounter (INDEPENDENT_AMBULATORY_CARE_PROVIDER_SITE_OTHER): Payer: Self-pay | Admitting: Orthopaedic Surgery

## 2018-05-12 VITALS — Ht 60.0 in | Wt 168.0 lb

## 2018-05-12 DIAGNOSIS — G8929 Other chronic pain: Secondary | ICD-10-CM | POA: Diagnosis not present

## 2018-05-12 DIAGNOSIS — M25562 Pain in left knee: Secondary | ICD-10-CM

## 2018-05-12 DIAGNOSIS — M1712 Unilateral primary osteoarthritis, left knee: Secondary | ICD-10-CM

## 2018-05-12 NOTE — Progress Notes (Signed)
The patient comes in today with left leg and knee pain.  She went to the emergency room a few days ago due to swelling in her leg and knee.  We actually saw her back in April and diagnosed her with severe osteoarthritis of the left knee.  She is on Relafen and she says that is helped.  She said since she has been to the emergency room her pain has calmed down.  They did give her a knee brace to wear.  She said the injection she had in April last only about 2 weeks.  On exam there is no knee swelling today of her left knee but she does have peripheral edema bilaterally.  I have recommended compressive socks for her.  I explained what this could do for her.  She is deferring any injection today and I agree with this and she is feeling better.  She can alternate the Relafen that she is on with Tylenol arthritis.  If he gets bad enough to have a knee replacement she will let us know.  I would always try at least one more steroid injection if needed.  All question concerns were answered and addressed.  Follow-up otherwise be as needed.

## 2018-05-22 ENCOUNTER — Other Ambulatory Visit: Payer: Self-pay

## 2018-05-22 ENCOUNTER — Encounter: Payer: Self-pay | Admitting: Podiatry

## 2018-05-22 ENCOUNTER — Ambulatory Visit (INDEPENDENT_AMBULATORY_CARE_PROVIDER_SITE_OTHER): Payer: Medicare HMO | Admitting: Podiatry

## 2018-05-22 DIAGNOSIS — Q828 Other specified congenital malformations of skin: Secondary | ICD-10-CM

## 2018-05-22 DIAGNOSIS — M21622 Bunionette of left foot: Secondary | ICD-10-CM

## 2018-05-22 NOTE — Progress Notes (Signed)
Subjective:   Patient ID: Diane Snow, female   DOB: 72 y.o.   MRN: [id number]   HPI Patient presents stating that got a lot of pain in his left foot and I know I need surgery but I cannot do until October   ROS      Objective:  Physical Exam  Neurovascular status unchanged with thick keratotic lesion sub-fifth metatarsal left     Assessment:  Porokeratotic lesion that is painful when palpated left     Plan:  Debrided lesion and reappoint for routine care with hopeful surgery to occur in the next few months

## 2018-05-23 ENCOUNTER — Telehealth (INDEPENDENT_AMBULATORY_CARE_PROVIDER_SITE_OTHER): Payer: Self-pay | Admitting: Orthopaedic Surgery

## 2018-05-23 NOTE — Telephone Encounter (Signed)
Pt Request  Left Knee injection unaware of injection type   Sched appt    [facility]  [id number]

## 2018-05-23 NOTE — Telephone Encounter (Signed)
Patient has appointment scheduled.  Cortisone Injection?

## 2018-05-26 NOTE — Telephone Encounter (Signed)
yes

## 2018-05-26 NOTE — Telephone Encounter (Signed)
Noted. Thank You.

## 2018-05-28 ENCOUNTER — Ambulatory Visit (INDEPENDENT_AMBULATORY_CARE_PROVIDER_SITE_OTHER): Payer: Medicare HMO | Admitting: Orthopaedic Surgery

## 2018-05-28 ENCOUNTER — Encounter (INDEPENDENT_AMBULATORY_CARE_PROVIDER_SITE_OTHER): Payer: Self-pay | Admitting: Orthopaedic Surgery

## 2018-05-28 DIAGNOSIS — M25562 Pain in left knee: Secondary | ICD-10-CM

## 2018-05-28 DIAGNOSIS — M1712 Unilateral primary osteoarthritis, left knee: Secondary | ICD-10-CM

## 2018-05-28 DIAGNOSIS — G8929 Other chronic pain: Secondary | ICD-10-CM

## 2018-05-28 MED ORDER — LIDOCAINE HCL 1 % IJ SOLN
3.0000 mL | INTRAMUSCULAR | Status: AC | PRN
  Administered 2018-05-28: 3 mL

## 2018-05-28 MED ORDER — METHYLPREDNISOLONE ACETATE 40 MG/ML IJ SUSP
40.0000 mg | INTRAMUSCULAR | Status: AC | PRN
  Administered 2018-05-28: 40 mg via INTRA_ARTICULAR

## 2018-05-28 NOTE — Progress Notes (Signed)
Office Visit Note   Patient: Diane Snow           Date of Birth: 21-Aug-1946           MRN: [id number] Visit Date: 05/28/2018              Requested by: Seward Carol, MD [address] [place], [place] [address] PCP: Seward Carol, MD   Assessment & Plan: Visit Diagnoses:  1. Unilateral primary osteoarthritis, left knee   2. Chronic pain of left knee     Plan: She does have known osteoarthritis of her left knee.  This knee pain is beginning to detrimental effect her quality of life and her activities daily living.  However she is not ready for knee replacement surgery yet.  I agree with her trying a steroid injection in her left knee since this is been helping temporize her pain and improve her mobility.  I do feel that she would benefit from continuing glucosamine and considering a knee sleeve.  She tolerated steroid injection well.  Follow-up will be as needed.  She knows that she can always have another injection in the Fall if she needs  Follow-Up Instructions: Return if symptoms worsen or fail to improve.   Orders:  Orders Placed This Encounter  Procedures  . Large Joint Inj   No orders of the defined types were placed in this encounter.     Procedures: Large Joint Inj: L knee on 05/28/2018 1:02 PM Indications: diagnostic evaluation and pain Details: 22 G 1.5 in needle, superolateral approach  Arthrogram: No  Medications: 3 mL lidocaine 1 %; 40 mg methylPREDNISolone acetate 40 MG/ML Outcome: tolerated well, no immediate complications Procedure, treatment alternatives, risks and benefits explained, specific risks discussed. Consent was given by the patient. Immediately prior to procedure a time out was called to verify the correct patient, procedure, equipment, support staff and site/side marked as required. Patient was prepped and draped in the usual sterile fashion.       Clinical Data: No additional findings.   Subjective: Chief Complaint    Patient presents with  . Left Knee - Follow-up  The patient comes in today with chief complaint of worsening left knee pain.  She would like to have a steroid injection in that knee today.  She is 72 years old.  This is helped her in the past remotely.  She denies any locking catching.  She tries to be as active as she can.  She is taking glucosamine in the past.  She denies any locking catching but more of just an aching pain and more activity related in terms of the pain.  HPI  Review of Systems She currently denies any headache, chest pain, shortness of breath, fever, chills, nausea, vomiting.  Objective: Vital Signs: There were no vitals taken for this visit.  Physical Exam She is alert and oriented x3 and in no acute distress Ortho Exam Examination of her left knee shows just a slight effusion.  She has medial lateral joint line tenderness and some patellofemoral crepitation but her knee feels ligaments is stable.  Her pain is only mild. Specialty Comments:  No specialty comments available.  Imaging: No results found.   PMFS History: Patient Active Problem List   Diagnosis Date Noted  . Chronic pain of left knee 05/12/2018  . Unilateral primary osteoarthritis, left knee 05/12/2018  . Essential hypertension 11/16/2015  . Hx of adenomatous colonic polyps 09/27/2014   Past Medical History:  Diagnosis Date  . Allergy   . Arthritis   . Hx of adenomatous colonic polyps 09/27/2014  . Hypertension     Family History  Problem Relation Age of Onset  . Breast cancer Maternal Aunt   . Colon cancer Neg Hx   . Rectal cancer Neg Hx   . Stomach cancer Neg Hx   . Esophageal cancer Neg Hx   . Pancreatic cancer Neg Hx     Past Surgical History:  Procedure Laterality Date  . BREAST BIOPSY Left 1976  . COLONOSCOPY    . PARTIAL HYSTERECTOMY     Social History   Occupational History  . Not on file  Tobacco Use  . Smoking status: Former Smoker    Last attempt to quit:  03/30/2011    Years since quitting: 7.1  . Smokeless tobacco: Never Used  Substance and Sexual Activity  . Alcohol use: Yes    Alcohol/week: 0.0 oz    Comment: occasionally  . Drug use: No  . Sexual activity: Not on file

## 2018-06-19 ENCOUNTER — Ambulatory Visit: Payer: Medicare HMO | Admitting: Podiatry

## 2018-06-19 ENCOUNTER — Encounter: Payer: Self-pay | Admitting: Podiatry

## 2018-06-19 DIAGNOSIS — M779 Enthesopathy, unspecified: Secondary | ICD-10-CM

## 2018-06-19 DIAGNOSIS — Q828 Other specified congenital malformations of skin: Secondary | ICD-10-CM

## 2018-06-19 DIAGNOSIS — M21622 Bunionette of left foot: Secondary | ICD-10-CM | POA: Diagnosis not present

## 2018-06-19 NOTE — Patient Instructions (Signed)
Pre-Operative Instructions  Congratulations, you have decided to take an important step towards improving your quality of life.  You can be assured that the doctors and staff at [facility] will be with you every step of the way.  Here are some important things you should know:  1. Plan to be at the surgery center/hospital at least 1 (one) hour prior to your scheduled time, unless otherwise directed by the surgical center/hospital staff.  You must have a responsible adult accompany you, remain during the surgery and drive you home.  Make sure you have directions to the surgical center/hospital to ensure you arrive on time. 2. If you are having surgery at [facility] or [facility], you will need a copy of your medical history and physical form from your family physician within one month prior to the date of surgery. We will give you a form for your primary physician to complete.  3. We make every effort to accommodate the date you request for surgery.  However, there are times where surgery dates or times have to be moved.  We will contact you as soon as possible if a change in schedule is required.   4. No aspirin/ibuprofen for one week before surgery.  If you are on aspirin, any non-steroidal anti-inflammatory medications (Mobic, Aleve, Ibuprofen) should not be taken seven (7) days prior to your surgery.  You make take Tylenol for pain prior to surgery.  5. Medications - If you are taking daily heart and blood pressure medications, seizure, reflux, allergy, asthma, anxiety, pain or diabetes medications, make sure you notify the surgery center/hospital before the day of surgery so they can tell you which medications you should take or avoid the day of surgery. 6. No food or drink after midnight the night before surgery unless directed otherwise by surgical center/hospital staff. 7. No alcoholic beverages 24-hours prior to surgery.  No smoking 24-hours prior or 24-hours after  surgery. 8. Wear loose pants or shorts. They should be loose enough to fit over bandages, boots, and casts. 9. Don't wear slip-on shoes. Sneakers are preferred. 10. Bring your boot with you to the surgery center/hospital.  Also bring crutches or a walker if your physician has prescribed it for you.  If you do not have this equipment, it will be provided for you after surgery. 11. If you have not been contacted by the surgery center/hospital by the day before your surgery, call to confirm the date and time of your surgery. 12. Leave-time from work may vary depending on the type of surgery you have.  Appropriate arrangements should be made prior to surgery with your employer. 13. Prescriptions will be provided immediately following surgery by your doctor.  Fill these as soon as possible after surgery and take the medication as directed. Pain medications will not be refilled on weekends and must be approved by the doctor. 14. Remove nail polish on the operative foot and avoid getting pedicures prior to surgery. 15. Wash the night before surgery.  The night before surgery wash the foot and leg well with water and the antibacterial soap provided. Be sure to pay special attention to beneath the toenails and in between the toes.  Wash for at least three (3) minutes. Rinse thoroughly with water and dry well with a towel.  Perform this wash unless told not to do so by your physician.  Enclosed: 1 Ice pack (please put in freezer the night before surgery)   1 Hibiclens skin cleaner     Pre-op instructions  If you have any questions regarding the instructions, please do not hesitate to call our office.  [place]: [address], [place], [place] [address] -- [phone]  [place]: [address] [place], [place] [address] -- [phone]  [place]: [address]  [place], [place] [address] -- [phone]  [place]: [address], [address], [place], [place] [address] -- [phone]  Website:  [email or web address] 

## 2018-06-19 NOTE — Progress Notes (Signed)
Subjective:   Patient ID: Diane Snow, female   DOB: 72 y.o.   MRN: [id number]   HPI Patient states she still having a lot of pain in her left foot and she wants surgery in October   ROS      Objective:  Physical Exam  Neurovascular status intact with severe keratotic lesion plantar aspect fifth metatarsal left is chronic in nature and is failed to respond to aggressive trimming technique     Assessment:  Chronic lesion secondary to bony prominence left fifth metatarsal with inflammatory capsulitis noted     Plan:  H&P condition reviewed and I have recommended at this time fifth metatarsal head resection with plantar skin wedge excision of the benign mass.  Patient wants surgery and I allowed her to read consent form reviewing alternative treatments complications and after review she signed consent form and is scheduled for outpatient surgery.  I also dispensed air fracture walker to completely immobilize the foot due to the plantar incision for the first several weeks and she will begin wearing that now to reduce the capsulitis and I viewed with her there is no guarantees as far as success in the total recovery can take 6 months.  Patient wants surgery and is scheduled for outpatient surgery patient had debridement accomplished of lesion today

## 2018-07-08 ENCOUNTER — Telehealth: Payer: Self-pay | Admitting: *Deleted

## 2018-07-08 NOTE — Telephone Encounter (Signed)
"  I'm scheduled for surgery on October 15.  I'd like to change that date to the following week."  Dr. Paulla Dolly does have time that date.  I will get it rescheduled.  "Will I need to be there at the same time?"  I cannot give you a time.  Someone from the surgical center will give you a call the Friday or Monday prior to your surgical date.  They will give you your arrival time.

## 2018-07-11 ENCOUNTER — Ambulatory Visit: Payer: Medicare HMO | Admitting: Podiatry

## 2018-07-14 DIAGNOSIS — L292 Pruritus vulvae: Secondary | ICD-10-CM | POA: Diagnosis not present

## 2018-07-23 ENCOUNTER — Encounter: Payer: Self-pay | Admitting: Podiatry

## 2018-07-23 ENCOUNTER — Ambulatory Visit: Payer: Medicare HMO | Admitting: Podiatry

## 2018-07-23 DIAGNOSIS — M21622 Bunionette of left foot: Secondary | ICD-10-CM

## 2018-07-24 DIAGNOSIS — L308 Other specified dermatitis: Secondary | ICD-10-CM | POA: Diagnosis not present

## 2018-07-26 NOTE — Progress Notes (Signed)
Subjective:   Patient ID: Diane Snow, female   DOB: 72 y.o.   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  She understands all risks associated with surgery

## 2018-08-01 ENCOUNTER — Telehealth: Payer: Self-pay | Admitting: *Deleted

## 2018-08-01 NOTE — Telephone Encounter (Signed)
I faxed clinicals to [facility] for review for authorization of the patient's surgery, that is scheduled for 08/19/2018 with Dr. Paulla Dolly.

## 2018-08-19 ENCOUNTER — Encounter: Payer: Self-pay | Admitting: Podiatry

## 2018-08-19 DIAGNOSIS — M21542 Acquired clubfoot, left foot: Secondary | ICD-10-CM | POA: Diagnosis not present

## 2018-08-19 DIAGNOSIS — D239 Other benign neoplasm of skin, unspecified: Secondary | ICD-10-CM | POA: Diagnosis not present

## 2018-08-19 DIAGNOSIS — M21622 Bunionette of left foot: Secondary | ICD-10-CM | POA: Diagnosis not present

## 2018-08-19 DIAGNOSIS — D2372 Other benign neoplasm of skin of left lower limb, including hip: Secondary | ICD-10-CM | POA: Diagnosis not present

## 2018-08-19 DIAGNOSIS — I10 Essential (primary) hypertension: Secondary | ICD-10-CM | POA: Diagnosis not present

## 2018-08-19 DIAGNOSIS — D2122 Benign neoplasm of connective and other soft tissue of left lower limb, including hip: Secondary | ICD-10-CM | POA: Diagnosis not present

## 2018-08-19 DIAGNOSIS — D492 Neoplasm of unspecified behavior of bone, soft tissue, and skin: Secondary | ICD-10-CM | POA: Diagnosis not present

## 2018-08-19 DIAGNOSIS — M216X2 Other acquired deformities of left foot: Secondary | ICD-10-CM | POA: Diagnosis not present

## 2018-08-27 ENCOUNTER — Encounter: Payer: Self-pay | Admitting: Podiatry

## 2018-08-27 ENCOUNTER — Ambulatory Visit (INDEPENDENT_AMBULATORY_CARE_PROVIDER_SITE_OTHER): Payer: Medicare HMO | Admitting: Podiatry

## 2018-08-27 ENCOUNTER — Ambulatory Visit (INDEPENDENT_AMBULATORY_CARE_PROVIDER_SITE_OTHER): Payer: Medicare HMO

## 2018-08-27 VITALS — Temp 98.8°F

## 2018-08-27 DIAGNOSIS — M21622 Bunionette of left foot: Secondary | ICD-10-CM | POA: Diagnosis not present

## 2018-08-27 NOTE — Progress Notes (Signed)
Subjective:   Patient ID: Diane Snow, female   DOB: 72 y.o.   MRN: [id number]   HPI Patient presents doing well with surgery with minimal discomfort or swelling   ROS      Objective:  Physical Exam  Neurovascular status intact negative Homans sign noted with well-healed surgical site left fifth metatarsal and plantar for removal of mass     Assessment:  Doing well postoperatively surgery left foot     Plan:  Reviewed continued conservative care reapplied sterile dressing continued immobilization and reappoint 2 weeks for suture removal or earlier if needed  X-ray indicates good resection head of fifth metatarsal left

## 2018-09-10 ENCOUNTER — Ambulatory Visit (INDEPENDENT_AMBULATORY_CARE_PROVIDER_SITE_OTHER): Payer: Medicare HMO

## 2018-09-10 ENCOUNTER — Ambulatory Visit (INDEPENDENT_AMBULATORY_CARE_PROVIDER_SITE_OTHER): Payer: Medicare HMO | Admitting: Podiatry

## 2018-09-10 ENCOUNTER — Encounter: Payer: Self-pay | Admitting: Podiatry

## 2018-09-10 DIAGNOSIS — M21622 Bunionette of left foot: Secondary | ICD-10-CM

## 2018-09-10 NOTE — Progress Notes (Signed)
Subjective:   Patient ID: Diane Snow, female   DOB: 72 y.o.   MRN: [id number]   HPI Patient states doing much better with minimal discomfort at the current time   ROS      Objective:  Physical Exam  Neurovascular status intact negative Homans sign noted with patient's left foot showing moderate swelling of the outside of the foot but no other pathology with wound edges well coapted plantar left     Assessment:  Doing well post fifth metatarsal head resection left and removal of plantar lesion with wide excision technique     Plan:  X-ray reviewed stitches are removed wound edges coapted well dispensed ankle compression and begin gradual increase in activity and hopeful return to shoe gear in the next 2 weeks  X-ray indicates satisfactory resection fifth metatarsal head resection with no other further pathology noted

## 2018-09-12 DIAGNOSIS — L819 Disorder of pigmentation, unspecified: Secondary | ICD-10-CM | POA: Diagnosis not present

## 2018-09-12 DIAGNOSIS — L821 Other seborrheic keratosis: Secondary | ICD-10-CM | POA: Diagnosis not present

## 2018-09-12 DIAGNOSIS — L300 Nummular dermatitis: Secondary | ICD-10-CM | POA: Diagnosis not present

## 2018-09-29 ENCOUNTER — Ambulatory Visit (INDEPENDENT_AMBULATORY_CARE_PROVIDER_SITE_OTHER): Payer: Medicare HMO | Admitting: Orthopaedic Surgery

## 2018-09-29 ENCOUNTER — Encounter (INDEPENDENT_AMBULATORY_CARE_PROVIDER_SITE_OTHER): Payer: Self-pay | Admitting: Orthopaedic Surgery

## 2018-09-29 ENCOUNTER — Ambulatory Visit (INDEPENDENT_AMBULATORY_CARE_PROVIDER_SITE_OTHER): Payer: Medicare HMO

## 2018-09-29 DIAGNOSIS — M25561 Pain in right knee: Secondary | ICD-10-CM

## 2018-09-29 DIAGNOSIS — M25562 Pain in left knee: Secondary | ICD-10-CM

## 2018-09-29 DIAGNOSIS — G8929 Other chronic pain: Secondary | ICD-10-CM

## 2018-09-29 MED ORDER — LIDOCAINE HCL 1 % IJ SOLN
3.0000 mL | INTRAMUSCULAR | Status: AC | PRN
  Administered 2018-09-29: 3 mL

## 2018-09-29 MED ORDER — METHYLPREDNISOLONE ACETATE 40 MG/ML IJ SUSP
40.0000 mg | INTRAMUSCULAR | Status: AC | PRN
  Administered 2018-09-29: 40 mg via INTRA_ARTICULAR

## 2018-09-29 NOTE — Progress Notes (Signed)
Office Visit Note   Patient: Diane Snow           Date of Birth: 22-Nov-1945           MRN: [id number] Visit Date: 09/29/2018              Requested by: Seward Carol, MD [address] [place], [place] [address] PCP: Seward Carol, MD   Assessment & Plan: Visit Diagnoses:  1. Acute pain of right knee   2. Chronic pain of left knee     Plan: I do think it is reasonable to try steroid injections in both her knees today since they have lasted so long.  She is been to continue to work on quad strengthening exercises as well.  All question concerns were answered and addressed.  She tolerated the steroid injections well.  Follow-up will be as needed.  Follow-Up Instructions: Return if symptoms worsen or fail to improve.   Orders:  Orders Placed This Encounter  Procedures  . Large Joint Inj  . Large Joint Inj  . XR Knee 1-2 Views Right   No orders of the defined types were placed in this encounter.     Procedures: Large Joint Inj: R knee on 09/29/2018 2:31 PM Indications: diagnostic evaluation and pain Details: 22 G 1.5 in needle, superolateral approach  Arthrogram: No  Medications: 3 mL lidocaine 1 %; 40 mg methylPREDNISolone acetate 40 MG/ML Outcome: tolerated well, no immediate complications Procedure, treatment alternatives, risks and benefits explained, specific risks discussed. Consent was given by the patient. Immediately prior to procedure a time out was called to verify the correct patient, procedure, equipment, support staff and site/side marked as required. Patient was prepped and draped in the usual sterile fashion.   Large Joint Inj: L knee on 09/29/2018 2:31 PM Indications: diagnostic evaluation and pain Details: 22 G 1.5 in needle, superolateral approach  Arthrogram: No  Medications: 3 mL lidocaine 1 %; 40 mg methylPREDNISolone acetate 40 MG/ML Outcome: tolerated well, no immediate complications Procedure, treatment alternatives, risks and  benefits explained, specific risks discussed. Consent was given by the patient. Immediately prior to procedure a time out was called to verify the correct patient, procedure, equipment, support staff and site/side marked as required. Patient was prepped and draped in the usual sterile fashion.       Clinical Data: No additional findings.   Subjective: Chief Complaint  Patient presents with  . Right Knee - Pain  Patient comes in today to consider injections in both knees.  She is well-known to me and actually had a steroid injection in her left knee due to none tricompartmental changes back in July.  She says today neither knee is hurting and she made this appointment because her right knee has been throbbing and hurting quite a bit and she points the medial joint line of her right knee.  She had some locking catching as well but now that is gone.  She is a active 72 years old.  She does say both knees are stiff.  She is not a diabetic.  HPI  Review of Systems  She currently denies any headache, chest pain, shortness of breath, fever, chills, nausea, vomiting. Objective: Vital Signs: There were no vitals taken for this visit.  Physical Exam She is alert and oriented x3 and in no acute distress Ortho Exam Examination of both knees show excellent range of motion with no effusion.  Both knees have varus malalignment with medial joint line  tenderness with patellofemoral crepitation. Specialty Comments:  No specialty comments available.  Imaging: Xr Knee 1-2 Views Right  Result Date: 09/29/2018 2 views of the right knee show no acute findings.  There is moderate arthritic changes with slight varus malalignment.  There are particular osteophytes around the patella and the patellofemoral joint as well as the medial compartment with medial compartment narrowing.    PMFS History: Patient Active Problem List   Diagnosis Date Noted  . Chronic pain of left knee 05/12/2018  . Unilateral  primary osteoarthritis, left knee 05/12/2018  . Essential hypertension 11/16/2015  . Hx of adenomatous colonic polyps 09/27/2014   Past Medical History:  Diagnosis Date  . Allergy   . Arthritis   . Hx of adenomatous colonic polyps 09/27/2014  . Hypertension     Family History  Problem Relation Age of Onset  . Breast cancer Maternal Aunt   . Colon cancer Neg Hx   . Rectal cancer Neg Hx   . Stomach cancer Neg Hx   . Esophageal cancer Neg Hx   . Pancreatic cancer Neg Hx     Past Surgical History:  Procedure Laterality Date  . BREAST BIOPSY Left 1976  . COLONOSCOPY    . PARTIAL HYSTERECTOMY     Social History   Occupational History  . Not on file  Tobacco Use  . Smoking status: Former Smoker    Last attempt to quit: 03/30/2011    Years since quitting: 7.5  . Smokeless tobacco: Never Used  Substance and Sexual Activity  . Alcohol use: Yes    Alcohol/week: 0.0 standard drinks    Comment: occasionally  . Drug use: No  . Sexual activity: Not on file

## 2018-10-13 DIAGNOSIS — B373 Candidiasis of vulva and vagina: Secondary | ICD-10-CM | POA: Diagnosis not present

## 2018-11-24 ENCOUNTER — Other Ambulatory Visit: Payer: Self-pay | Admitting: Internal Medicine

## 2018-11-24 DIAGNOSIS — Z1231 Encounter for screening mammogram for malignant neoplasm of breast: Secondary | ICD-10-CM

## 2018-12-18 ENCOUNTER — Ambulatory Visit (INDEPENDENT_AMBULATORY_CARE_PROVIDER_SITE_OTHER): Payer: Medicare HMO | Admitting: Physician Assistant

## 2018-12-18 ENCOUNTER — Encounter (INDEPENDENT_AMBULATORY_CARE_PROVIDER_SITE_OTHER): Payer: Self-pay | Admitting: Physician Assistant

## 2018-12-18 DIAGNOSIS — M1711 Unilateral primary osteoarthritis, right knee: Secondary | ICD-10-CM

## 2018-12-18 MED ORDER — DICLOFENAC SODIUM 1 % TD GEL: 4.0000 g | Freq: Four times a day (QID) | TRANSDERMAL | 2 refills | Status: DC

## 2018-12-18 NOTE — Progress Notes (Signed)
HPI: [name] comes in today due to increasing right knee pain.  She is last seen by Dr. Ninfa Snow on 09/29/2018 and given injection of both knees this helped.  However she is having increasing pain in the right knee.  She did have a fall about a month ago he did not fall on the right knee.  She is asking about a repeat injection in the right knee.  She has known tricompartmental arthritis of both knees.  Physical exam: General well-developed well-nourished female no acute distress mood affect appropriate.  Ambulates without any assistive device.  Right knee tenderness along medial joint line no instability valgus varus stressing.  Good range of motion right knee.  No effusion abnormal warmth erythema or ecchymosis.  Impression: Right knee tricompartmental arthritis  Plan: I discussed with her if I gave her a cortisone injection would not recommend repeat cortisone injection for 4 months.  She asked about other treatments that she could try suggest Voltaren gel she like a prescription for this.  We will see her back in early March for repeat injection of both knees.  Questions were encouraged and answered at length.

## 2019-01-05 ENCOUNTER — Ambulatory Visit
Admission: RE | Admit: 2019-01-05 | Discharge: 2019-01-05 | Disposition: A | Discharge status: Home / Self Care | Payer: Medicare HMO | Source: Ambulatory Visit | Attending: Internal Medicine | Admitting: Internal Medicine

## 2019-01-05 DIAGNOSIS — Z1231 Encounter for screening mammogram for malignant neoplasm of breast: Secondary | ICD-10-CM

## 2019-01-08 DIAGNOSIS — E1169 Type 2 diabetes mellitus with other specified complication: Secondary | ICD-10-CM | POA: Diagnosis not present

## 2019-01-08 DIAGNOSIS — Z0000 Encounter for general adult medical examination without abnormal findings: Secondary | ICD-10-CM | POA: Diagnosis not present

## 2019-01-08 DIAGNOSIS — I10 Essential (primary) hypertension: Secondary | ICD-10-CM | POA: Diagnosis not present

## 2019-01-08 DIAGNOSIS — E7800 Pure hypercholesterolemia, unspecified: Secondary | ICD-10-CM | POA: Diagnosis not present

## 2019-01-08 DIAGNOSIS — E663 Overweight: Secondary | ICD-10-CM | POA: Diagnosis not present

## 2019-01-08 DIAGNOSIS — Z1389 Encounter for screening for other disorder: Secondary | ICD-10-CM | POA: Diagnosis not present

## 2019-01-14 ENCOUNTER — Other Ambulatory Visit: Payer: Self-pay | Admitting: Podiatry

## 2019-01-14 ENCOUNTER — Encounter: Payer: Self-pay | Admitting: Podiatry

## 2019-01-14 ENCOUNTER — Other Ambulatory Visit: Payer: Self-pay

## 2019-01-14 ENCOUNTER — Ambulatory Visit (INDEPENDENT_AMBULATORY_CARE_PROVIDER_SITE_OTHER): Payer: Medicare HMO

## 2019-01-14 ENCOUNTER — Ambulatory Visit: Payer: Medicare HMO | Admitting: Podiatry

## 2019-01-14 DIAGNOSIS — M79671 Pain in right foot: Secondary | ICD-10-CM | POA: Diagnosis not present

## 2019-01-14 DIAGNOSIS — M21622 Bunionette of left foot: Secondary | ICD-10-CM

## 2019-01-14 DIAGNOSIS — M21372 Foot drop, left foot: Secondary | ICD-10-CM | POA: Diagnosis not present

## 2019-01-14 DIAGNOSIS — M779 Enthesopathy, unspecified: Secondary | ICD-10-CM

## 2019-01-18 NOTE — Progress Notes (Signed)
Subjective:   Patient ID: Diane Snow, female   DOB: 73 y.o.   MRN: [id number]   HPI Patient presents stating that she was concerned after surgery that her foot seems to be dragging and she wanted to make sure there was no issues.  She is overall very satisfied with the actual surgical procedure itself   ROS      Objective:  Physical Exam  Neurovascular status intact and I did full muscle strength testing and I was not able to ascertain a difference in the anterior tibial or the extensor muscle group between the right and left foot.  I did watch her walk and I did not notice any pathology and she is conscious of this but it does not seem to affect her gait and she is had no balance issues     Assessment:  Difficult to make complete determination as to whether or not this may be a pathological problem or it is simply just recovery still from surgery     Plan:  Reviewed condition and recommended the continuation of conservative treatment and discussed possible bracing if she feels unstable but at this point I do think it is premature  X-ray was negative for signs of bone pathology it appears to be some form of soft tissue condition

## 2019-02-11 ENCOUNTER — Encounter (INDEPENDENT_AMBULATORY_CARE_PROVIDER_SITE_OTHER): Payer: Self-pay | Admitting: Orthopaedic Surgery

## 2019-02-11 ENCOUNTER — Other Ambulatory Visit (INDEPENDENT_AMBULATORY_CARE_PROVIDER_SITE_OTHER): Payer: Self-pay | Admitting: Physician Assistant

## 2019-02-11 ENCOUNTER — Ambulatory Visit (INDEPENDENT_AMBULATORY_CARE_PROVIDER_SITE_OTHER): Payer: Medicare HMO | Admitting: Orthopaedic Surgery

## 2019-02-11 ENCOUNTER — Telehealth (INDEPENDENT_AMBULATORY_CARE_PROVIDER_SITE_OTHER): Payer: Self-pay

## 2019-02-11 ENCOUNTER — Other Ambulatory Visit (INDEPENDENT_AMBULATORY_CARE_PROVIDER_SITE_OTHER): Payer: Self-pay

## 2019-02-11 ENCOUNTER — Other Ambulatory Visit: Payer: Self-pay

## 2019-02-11 DIAGNOSIS — M170 Bilateral primary osteoarthritis of knee: Secondary | ICD-10-CM

## 2019-02-11 DIAGNOSIS — M1712 Unilateral primary osteoarthritis, left knee: Secondary | ICD-10-CM | POA: Diagnosis not present

## 2019-02-11 DIAGNOSIS — M1711 Unilateral primary osteoarthritis, right knee: Secondary | ICD-10-CM

## 2019-02-11 DIAGNOSIS — R2681 Unsteadiness on feet: Secondary | ICD-10-CM

## 2019-02-11 MED ORDER — METHYLPREDNISOLONE ACETATE 40 MG/ML IJ SUSP
40.0000 mg | INTRAMUSCULAR | Status: AC | PRN
  Administered 2019-02-11: 40 mg via INTRA_ARTICULAR

## 2019-02-11 MED ORDER — LIDOCAINE HCL 1 % IJ SOLN
0.5000 mL | INTRAMUSCULAR | Status: AC | PRN
  Administered 2019-02-11: .5 mL

## 2019-02-11 MED ORDER — DICLOFENAC SODIUM 1 % TD GEL: 4.0000 g | Freq: Four times a day (QID) | TRANSDERMAL | 2 refills | Status: AC

## 2019-02-11 MED ORDER — LIDOCAINE HCL 1 % IJ SOLN
0.5000 mL | INTRAMUSCULAR | Status: AC | PRN
Start: 2019-02-11 — End: 2019-02-11
  Administered 2019-02-11: .5 mL

## 2019-02-11 NOTE — Progress Notes (Signed)
   Procedure Note  Patient: Diane Snow             Date of Birth: 1946-05-03           MRN: [id number]             Visit Date: 02/11/2019  HPI: Mrs. Heffington comes in today for bilateral knee injections.  She has known tricompartmental osteoarthritis of both knees with left knee near bone-on-bone medial compartment.  She is nondiabetic.  She has had no new injury to either knee.  She states that she is having some problems with her left foot and sometimes does not lift it.  She feels her gait is off and that she may fall.  Review of systems: Denies any fevers chills shortness breath Physical exam: General: Well-developed well-nourished female no acute distress.  She requires some assistance to get on and off the table secondary to instability.  Left foot full dorsiflexion plantarflexion.  Nontender over the Achilles, posterior tibial, or tendon peroneal tendons.  No weakness with inversion eversion against resistance left foot.  Bilateral knees: No abnormal warmth erythema or effusion.  Left knee tenderness along medial joint line.  Slight tenderness along medial joint line of the right knee.  No instability valgus varus stressing of either knee.  Good range of motion bilateral knees.  Procedures: Visit Diagnoses: Unilateral primary osteoarthritis, left knee  Unilateral primary osteoarthritis, right knee  Gait instability  Large Joint Inj: bilateral knee on 02/11/2019 10:16 AM Indications: pain Details: 22 G 1.5 in needle, anterolateral approach  Arthrogram: No  Medications (Right): 0.5 mL lidocaine 1 %; 40 mg methylPREDNISolone acetate 40 MG/ML Medications (Left): 0.5 mL lidocaine 1 %; 40 mg methylPREDNISolone acetate 40 MG/ML Outcome: tolerated well, no immediate complications Procedure, treatment alternatives, risks and benefits explained, specific risks discussed. Consent was given by the patient. Immediately prior to procedure a time out was called to verify the correct patient,  procedure, equipment, support staff and site/side marked as required. Patient was prepped and draped in the usual sterile fashion.    Plan: Recommend she work on quad strengthening on her own.  We will also send her to formal therapy for quad strengthening home exercise program modalities and also to work on gait and balance due to her instability.  We will try to obtain approval for bilateral knee supplemental injections.  She will follow-up with [place] in 1 month.  Questions encouraged and answered at length by Dr. Ninfa Linden and myself.

## 2019-02-11 NOTE — Telephone Encounter (Signed)
Bilateral knee gel injections 

## 2019-02-16 NOTE — Telephone Encounter (Signed)
Noted  

## 2019-02-17 DIAGNOSIS — M25672 Stiffness of left ankle, not elsewhere classified: Secondary | ICD-10-CM | POA: Diagnosis not present

## 2019-02-17 DIAGNOSIS — M1712 Unilateral primary osteoarthritis, left knee: Secondary | ICD-10-CM | POA: Diagnosis not present

## 2019-02-17 DIAGNOSIS — R262 Difficulty in walking, not elsewhere classified: Secondary | ICD-10-CM | POA: Diagnosis not present

## 2019-02-17 DIAGNOSIS — M1711 Unilateral primary osteoarthritis, right knee: Secondary | ICD-10-CM | POA: Diagnosis not present

## 2019-02-17 DIAGNOSIS — M170 Bilateral primary osteoarthritis of knee: Secondary | ICD-10-CM | POA: Diagnosis not present

## 2019-02-18 ENCOUNTER — Telehealth (INDEPENDENT_AMBULATORY_CARE_PROVIDER_SITE_OTHER): Payer: Self-pay

## 2019-02-18 NOTE — Telephone Encounter (Signed)
Submitted VOB for Monovisc, bilateral knee. 

## 2019-02-19 ENCOUNTER — Telehealth (INDEPENDENT_AMBULATORY_CARE_PROVIDER_SITE_OTHER): Payer: Self-pay

## 2019-02-19 DIAGNOSIS — M25672 Stiffness of left ankle, not elsewhere classified: Secondary | ICD-10-CM | POA: Diagnosis not present

## 2019-02-19 DIAGNOSIS — M1711 Unilateral primary osteoarthritis, right knee: Secondary | ICD-10-CM | POA: Diagnosis not present

## 2019-02-19 DIAGNOSIS — R262 Difficulty in walking, not elsewhere classified: Secondary | ICD-10-CM | POA: Diagnosis not present

## 2019-02-19 DIAGNOSIS — M1712 Unilateral primary osteoarthritis, left knee: Secondary | ICD-10-CM | POA: Diagnosis not present

## 2019-02-19 DIAGNOSIS — M170 Bilateral primary osteoarthritis of knee: Secondary | ICD-10-CM | POA: Diagnosis not present

## 2019-02-19 NOTE — Telephone Encounter (Signed)
Patient is approved for Monovisc, bilateral knee. [place] Patient will be responsible for 20% of the allowable amount. Co-pay of $45.00 No PA required  Appt. 03/16/2019 with Dr. Ninfa Linden

## 2019-02-23 DIAGNOSIS — M170 Bilateral primary osteoarthritis of knee: Secondary | ICD-10-CM | POA: Diagnosis not present

## 2019-02-23 DIAGNOSIS — R262 Difficulty in walking, not elsewhere classified: Secondary | ICD-10-CM | POA: Diagnosis not present

## 2019-02-23 DIAGNOSIS — M1712 Unilateral primary osteoarthritis, left knee: Secondary | ICD-10-CM | POA: Diagnosis not present

## 2019-02-23 DIAGNOSIS — M25672 Stiffness of left ankle, not elsewhere classified: Secondary | ICD-10-CM | POA: Diagnosis not present

## 2019-02-23 DIAGNOSIS — M1711 Unilateral primary osteoarthritis, right knee: Secondary | ICD-10-CM | POA: Diagnosis not present

## 2019-02-25 DIAGNOSIS — M1711 Unilateral primary osteoarthritis, right knee: Secondary | ICD-10-CM | POA: Diagnosis not present

## 2019-02-25 DIAGNOSIS — R262 Difficulty in walking, not elsewhere classified: Secondary | ICD-10-CM | POA: Diagnosis not present

## 2019-02-25 DIAGNOSIS — M1712 Unilateral primary osteoarthritis, left knee: Secondary | ICD-10-CM | POA: Diagnosis not present

## 2019-02-25 DIAGNOSIS — M170 Bilateral primary osteoarthritis of knee: Secondary | ICD-10-CM | POA: Diagnosis not present

## 2019-02-25 DIAGNOSIS — M25672 Stiffness of left ankle, not elsewhere classified: Secondary | ICD-10-CM | POA: Diagnosis not present

## 2019-03-02 DIAGNOSIS — M1712 Unilateral primary osteoarthritis, left knee: Secondary | ICD-10-CM | POA: Diagnosis not present

## 2019-03-02 DIAGNOSIS — M170 Bilateral primary osteoarthritis of knee: Secondary | ICD-10-CM | POA: Diagnosis not present

## 2019-03-02 DIAGNOSIS — M25672 Stiffness of left ankle, not elsewhere classified: Secondary | ICD-10-CM | POA: Diagnosis not present

## 2019-03-02 DIAGNOSIS — M1711 Unilateral primary osteoarthritis, right knee: Secondary | ICD-10-CM | POA: Diagnosis not present

## 2019-03-02 DIAGNOSIS — R262 Difficulty in walking, not elsewhere classified: Secondary | ICD-10-CM | POA: Diagnosis not present

## 2019-03-04 DIAGNOSIS — R262 Difficulty in walking, not elsewhere classified: Secondary | ICD-10-CM | POA: Diagnosis not present

## 2019-03-04 DIAGNOSIS — M170 Bilateral primary osteoarthritis of knee: Secondary | ICD-10-CM | POA: Diagnosis not present

## 2019-03-04 DIAGNOSIS — M25672 Stiffness of left ankle, not elsewhere classified: Secondary | ICD-10-CM | POA: Diagnosis not present

## 2019-03-04 DIAGNOSIS — M1712 Unilateral primary osteoarthritis, left knee: Secondary | ICD-10-CM | POA: Diagnosis not present

## 2019-03-04 DIAGNOSIS — M1711 Unilateral primary osteoarthritis, right knee: Secondary | ICD-10-CM | POA: Diagnosis not present

## 2019-03-09 DIAGNOSIS — R262 Difficulty in walking, not elsewhere classified: Secondary | ICD-10-CM | POA: Diagnosis not present

## 2019-03-09 DIAGNOSIS — M25672 Stiffness of left ankle, not elsewhere classified: Secondary | ICD-10-CM | POA: Diagnosis not present

## 2019-03-09 DIAGNOSIS — M1711 Unilateral primary osteoarthritis, right knee: Secondary | ICD-10-CM | POA: Diagnosis not present

## 2019-03-09 DIAGNOSIS — M170 Bilateral primary osteoarthritis of knee: Secondary | ICD-10-CM | POA: Diagnosis not present

## 2019-03-09 DIAGNOSIS — M1712 Unilateral primary osteoarthritis, left knee: Secondary | ICD-10-CM | POA: Diagnosis not present

## 2019-03-11 DIAGNOSIS — M170 Bilateral primary osteoarthritis of knee: Secondary | ICD-10-CM | POA: Diagnosis not present

## 2019-03-11 DIAGNOSIS — M25672 Stiffness of left ankle, not elsewhere classified: Secondary | ICD-10-CM | POA: Diagnosis not present

## 2019-03-11 DIAGNOSIS — R262 Difficulty in walking, not elsewhere classified: Secondary | ICD-10-CM | POA: Diagnosis not present

## 2019-03-16 ENCOUNTER — Other Ambulatory Visit: Payer: Self-pay

## 2019-03-16 ENCOUNTER — Encounter: Payer: Self-pay | Admitting: Orthopaedic Surgery

## 2019-03-16 ENCOUNTER — Ambulatory Visit: Payer: Medicare HMO | Admitting: Orthopaedic Surgery

## 2019-03-16 DIAGNOSIS — M1711 Unilateral primary osteoarthritis, right knee: Secondary | ICD-10-CM

## 2019-03-16 DIAGNOSIS — M1712 Unilateral primary osteoarthritis, left knee: Secondary | ICD-10-CM | POA: Diagnosis not present

## 2019-03-16 DIAGNOSIS — M170 Bilateral primary osteoarthritis of knee: Secondary | ICD-10-CM

## 2019-03-16 MED ORDER — HYALURONAN 88 MG/4ML IX SOSY
88.0000 mg | PREFILLED_SYRINGE | INTRA_ARTICULAR | Status: AC | PRN
  Administered 2019-03-16: 88 mg via INTRA_ARTICULAR

## 2019-03-16 NOTE — Progress Notes (Signed)
   Procedure Note  Patient: Diane Snow             Date of Birth: [birth year]           MRN: [id number]             Visit Date: 03/16/2019 HPI: Mrs. terrain returns today for bilateral Monovisc injections due to her knee arthritis.  She has had no new injury.  She does state that the cortisone injection in both knees did give her some relief.  Left knee is more bothersome than the right. Review of systems: No fevers chills  Physical exam: Bilateral knees good range of motion.  No effusion abnormal warmth or erythema of either knee.  Slight edema of the left knee.  Procedures: Visit Diagnoses: Unilateral primary osteoarthritis, left knee  Unilateral primary osteoarthritis, right knee  Large Joint Inj: bilateral knee on 03/16/2019 10:48 AM Indications: pain Details: 22 G 1.5 in needle, anterolateral approach  Arthrogram: No  Medications (Right): 88 mg Hyaluronan 88 MG/4ML Medications (Left): 88 mg Hyaluronan 88 MG/4ML Outcome: tolerated well, no immediate complications Procedure, treatment alternatives, risks and benefits explained, specific risks discussed. Consent was given by the patient. Immediately prior to procedure a time out was called to verify the correct patient, procedure, equipment, support staff and site/side marked as required. Patient was prepped and draped in the usual sterile fashion.     Plan: She will continue to work on quad strengthening.  She understands she can have supplemental injections no more often than every 6 months and cortisone the more often every 3 months.  She will follow-up on as-needed basis.

## 2019-03-25 DIAGNOSIS — R262 Difficulty in walking, not elsewhere classified: Secondary | ICD-10-CM | POA: Diagnosis not present

## 2019-03-25 DIAGNOSIS — M1711 Unilateral primary osteoarthritis, right knee: Secondary | ICD-10-CM | POA: Diagnosis not present

## 2019-03-25 DIAGNOSIS — H2513 Age-related nuclear cataract, bilateral: Secondary | ICD-10-CM | POA: Diagnosis not present

## 2019-03-25 DIAGNOSIS — M1712 Unilateral primary osteoarthritis, left knee: Secondary | ICD-10-CM | POA: Diagnosis not present

## 2019-03-25 DIAGNOSIS — M25672 Stiffness of left ankle, not elsewhere classified: Secondary | ICD-10-CM | POA: Diagnosis not present

## 2019-03-25 DIAGNOSIS — M170 Bilateral primary osteoarthritis of knee: Secondary | ICD-10-CM | POA: Diagnosis not present

## 2019-03-31 DIAGNOSIS — M1711 Unilateral primary osteoarthritis, right knee: Secondary | ICD-10-CM | POA: Diagnosis not present

## 2019-03-31 DIAGNOSIS — M170 Bilateral primary osteoarthritis of knee: Secondary | ICD-10-CM | POA: Diagnosis not present

## 2019-03-31 DIAGNOSIS — R262 Difficulty in walking, not elsewhere classified: Secondary | ICD-10-CM | POA: Diagnosis not present

## 2019-03-31 DIAGNOSIS — M25672 Stiffness of left ankle, not elsewhere classified: Secondary | ICD-10-CM | POA: Diagnosis not present

## 2019-03-31 DIAGNOSIS — M1712 Unilateral primary osteoarthritis, left knee: Secondary | ICD-10-CM | POA: Diagnosis not present

## 2019-03-31 DIAGNOSIS — Z0189 Encounter for other specified special examinations: Secondary | ICD-10-CM | POA: Diagnosis not present

## 2019-04-01 DIAGNOSIS — M1711 Unilateral primary osteoarthritis, right knee: Secondary | ICD-10-CM | POA: Diagnosis not present

## 2019-04-01 DIAGNOSIS — M1712 Unilateral primary osteoarthritis, left knee: Secondary | ICD-10-CM | POA: Diagnosis not present

## 2019-04-01 DIAGNOSIS — M25672 Stiffness of left ankle, not elsewhere classified: Secondary | ICD-10-CM | POA: Diagnosis not present

## 2019-04-01 DIAGNOSIS — R262 Difficulty in walking, not elsewhere classified: Secondary | ICD-10-CM | POA: Diagnosis not present

## 2019-04-01 DIAGNOSIS — M170 Bilateral primary osteoarthritis of knee: Secondary | ICD-10-CM | POA: Diagnosis not present

## 2019-04-08 DIAGNOSIS — M1712 Unilateral primary osteoarthritis, left knee: Secondary | ICD-10-CM | POA: Diagnosis not present

## 2019-04-08 DIAGNOSIS — M1711 Unilateral primary osteoarthritis, right knee: Secondary | ICD-10-CM | POA: Diagnosis not present

## 2019-04-08 DIAGNOSIS — M25672 Stiffness of left ankle, not elsewhere classified: Secondary | ICD-10-CM | POA: Diagnosis not present

## 2019-04-08 DIAGNOSIS — R262 Difficulty in walking, not elsewhere classified: Secondary | ICD-10-CM | POA: Diagnosis not present

## 2019-04-08 DIAGNOSIS — M170 Bilateral primary osteoarthritis of knee: Secondary | ICD-10-CM | POA: Diagnosis not present

## 2019-04-09 DIAGNOSIS — M170 Bilateral primary osteoarthritis of knee: Secondary | ICD-10-CM | POA: Diagnosis not present

## 2019-04-09 DIAGNOSIS — M1712 Unilateral primary osteoarthritis, left knee: Secondary | ICD-10-CM | POA: Diagnosis not present

## 2019-04-09 DIAGNOSIS — R262 Difficulty in walking, not elsewhere classified: Secondary | ICD-10-CM | POA: Diagnosis not present

## 2019-04-09 DIAGNOSIS — M1711 Unilateral primary osteoarthritis, right knee: Secondary | ICD-10-CM | POA: Diagnosis not present

## 2019-04-09 DIAGNOSIS — M25672 Stiffness of left ankle, not elsewhere classified: Secondary | ICD-10-CM | POA: Diagnosis not present

## 2019-04-15 ENCOUNTER — Ambulatory Visit: Payer: Medicare HMO | Admitting: Physician Assistant

## 2019-04-29 ENCOUNTER — Other Ambulatory Visit: Payer: Self-pay

## 2019-04-29 ENCOUNTER — Ambulatory Visit (INDEPENDENT_AMBULATORY_CARE_PROVIDER_SITE_OTHER): Payer: Medicare HMO | Admitting: Orthopaedic Surgery

## 2019-04-29 ENCOUNTER — Encounter: Payer: Self-pay | Admitting: Orthopaedic Surgery

## 2019-04-29 DIAGNOSIS — M1712 Unilateral primary osteoarthritis, left knee: Secondary | ICD-10-CM

## 2019-04-29 DIAGNOSIS — M79622 Pain in left upper arm: Secondary | ICD-10-CM

## 2019-04-29 DIAGNOSIS — M170 Bilateral primary osteoarthritis of knee: Secondary | ICD-10-CM | POA: Diagnosis not present

## 2019-04-29 DIAGNOSIS — M1711 Unilateral primary osteoarthritis, right knee: Secondary | ICD-10-CM | POA: Diagnosis not present

## 2019-04-29 MED ORDER — METHYLPREDNISOLONE 4 MG PO TABS: ORAL_TABLET | ORAL | 0 refills | Status: DC

## 2019-04-29 NOTE — Progress Notes (Signed)
The patient is less than 3 months status post steroid injections in both her knees to treat the pain from osteoarthritis and then she is only 6 weeks out from bilateral knee injections with Monovisc.  She says her knee is not really hurting much but she like to have steroid injections today.  I had a long and thorough discussion with her in terms of how that would not be prudent today.  She seemed understand that after I talked her in length about it.  She then said that her left arm is been hurting her and she may have strained the muscles.  Is been going on for about a month and hurts when she carries her purse.  She points to the bicep area of her arm as a source of her pain.  On examination of her left arm there is no deficits or atrophy of the muscles.  Her shoulder is well located.  Her elbow moves fully as is her wrist and hand.  There is some slight pain over the biceps muscle itself but no indication of issues with the distal biceps tendon.  I recommended Voltaren gel as an over-the-counter anti-inflammatory that she can try on her arm and her knees.  I will least try a 6-day steroid taper because she does now report a slight knee pain.  We will see her back in 6 weeks to potentially provide steroid injections in both knees.

## 2019-06-15 ENCOUNTER — Encounter: Payer: Self-pay | Admitting: Orthopaedic Surgery

## 2019-06-15 ENCOUNTER — Ambulatory Visit (INDEPENDENT_AMBULATORY_CARE_PROVIDER_SITE_OTHER): Payer: Medicare HMO | Admitting: Orthopaedic Surgery

## 2019-06-15 DIAGNOSIS — M25561 Pain in right knee: Secondary | ICD-10-CM | POA: Diagnosis not present

## 2019-06-15 DIAGNOSIS — G8929 Other chronic pain: Secondary | ICD-10-CM | POA: Diagnosis not present

## 2019-06-15 DIAGNOSIS — M25562 Pain in left knee: Secondary | ICD-10-CM

## 2019-06-15 DIAGNOSIS — M1712 Unilateral primary osteoarthritis, left knee: Secondary | ICD-10-CM

## 2019-06-15 DIAGNOSIS — M1711 Unilateral primary osteoarthritis, right knee: Secondary | ICD-10-CM

## 2019-06-15 MED ORDER — METHYLPREDNISOLONE ACETATE 40 MG/ML IJ SUSP
40.0000 mg | INTRAMUSCULAR | Status: AC | PRN
  Administered 2019-06-15: 40 mg via INTRA_ARTICULAR

## 2019-06-15 MED ORDER — METHYLPREDNISOLONE ACETATE 40 MG/ML IJ SUSP
40.0000 mg | INTRAMUSCULAR | Status: AC | PRN
  Administered 2019-06-15: 13:00:00 40 mg via INTRA_ARTICULAR

## 2019-06-15 MED ORDER — LIDOCAINE HCL 1 % IJ SOLN
3.0000 mL | INTRAMUSCULAR | Status: AC | PRN
  Administered 2019-06-15: 3 mL

## 2019-06-15 NOTE — Progress Notes (Signed)
Office Visit Note   Patient: Diane Snow           Date of Birth: [birth year]           MRN: [id number] Visit Date: 06/15/2019              Requested by: Seward Carol, MD [address] [place],  [place] [id number] PCP: Seward Carol, MD   Assessment & Plan: Visit Diagnoses:  1. Chronic pain of left knee   2. Chronic pain of right knee   3. Unilateral primary osteoarthritis, left knee   4. Unilateral primary osteoarthritis, right knee     Plan: Per her wishes I did place a steroid injection in both knees.  She has had these before and tolerated them well.  We will see her back in 3 months consider repeat hyaluronic acid injections with Monovisc in both knees.  Follow-Up Instructions: Return in about 3 months (around 09/15/2019), or if symptoms worsen or fail to improve.   Orders:  Orders Placed This Encounter  Procedures  . Large Joint Inj  . Large Joint Inj   No orders of the defined types were placed in this encounter.     Procedures: Large Joint Inj: R knee on 06/15/2019 1:06 PM Indications: diagnostic evaluation and pain Details: 22 G 1.5 in needle, superolateral approach  Arthrogram: No  Medications: 3 mL lidocaine 1 %; 40 mg methylPREDNISolone acetate 40 MG/ML Outcome: tolerated well, no immediate complications Procedure, treatment alternatives, risks and benefits explained, specific risks discussed. Consent was given by the patient. Immediately prior to procedure a time out was called to verify the correct patient, procedure, equipment, support staff and site/side marked as required. Patient was prepped and draped in the usual sterile fashion.   Large Joint Inj: L knee on 06/15/2019 1:06 PM Indications: diagnostic evaluation and pain Details: 22 G 1.5 in needle, superolateral approach  Arthrogram: No  Medications: 3 mL lidocaine 1 %; 40 mg methylPREDNISolone acetate 40 MG/ML Outcome: tolerated well, no immediate complications Procedure,  treatment alternatives, risks and benefits explained, specific risks discussed. Consent was given by the patient. Immediately prior to procedure a time out was called to verify the correct patient, procedure, equipment, support staff and site/side marked as required. Patient was prepped and draped in the usual sterile fashion.       Clinical Data: No additional findings.   Subjective: Chief Complaint  Patient presents with  . Right Knee - Pain  . Left Knee - Pain  The patient is well-known to [place].  She has known osteoarthritis and chronic pain in both her knees.  She last had hyaluronic acid injections with Monovisc in both knees in May of this year.  She would like to have steroid injections today in both knees and consider repeat hyaluronic acid injections in about 3 months from now when it will be 6 months since her last hyaluronic acid injections.  She is very active 73 years old.  She said this is helping her manage her knee pain with keeping her out of considering any type of surgery thus far.  She is had no other acute changes in her medical status.  HPI  Review of Systems She currently denies any headache, chest pain, shortness of breath, fever, chills, nausea, vomiting  Objective: Vital Signs: There were no vitals taken for this visit.  Physical Exam She is alert and orient x3 and in no acute distress.  She ambulates slowly. Ortho Exam Both  knees are examined today.  Neither knee has an effusion.  Both knees have global tenderness throughout the arc of motion and pain on stressing the knees.  Both knees are ligamentously stable. Specialty Comments:  No specialty comments available.  Imaging: No results found.   PMFS History: Patient Active Problem List   Diagnosis Date Noted  . Chronic pain of right knee 06/15/2019  . Unilateral primary osteoarthritis, right knee 06/15/2019  . Chronic pain of left knee 05/12/2018  . Unilateral primary osteoarthritis, left knee  05/12/2018  . Essential hypertension 11/16/2015  . Hx of adenomatous colonic polyps 09/27/2014   Past Medical History:  Diagnosis Date  . Allergy   . Arthritis   . Hx of adenomatous colonic polyps 09/27/2014  . Hypertension     Family History  Problem Relation Age of Onset  . Breast cancer Maternal Aunt   . Colon cancer Neg Hx   . Rectal cancer Neg Hx   . Stomach cancer Neg Hx   . Esophageal cancer Neg Hx   . Pancreatic cancer Neg Hx     Past Surgical History:  Procedure Laterality Date  . BREAST BIOPSY Left 1976  . BREAST EXCISIONAL BIOPSY Left   . COLONOSCOPY    . PARTIAL HYSTERECTOMY     Social History   Occupational History  . Not on file  Tobacco Use  . Smoking status: Former Smoker    Quit date: 03/30/2011    Years since quitting: 8.2  . Smokeless tobacco: Never Used  Substance and Sexual Activity  . Alcohol use: Yes    Alcohol/week: 0.0 standard drinks    Comment: occasionally  . Drug use: No  . Sexual activity: Not on file

## 2019-07-13 ENCOUNTER — Ambulatory Visit
Admission: RE | Admit: 2019-07-13 | Discharge: 2019-07-13 | Disposition: A | Discharge status: Home / Self Care | Payer: Medicare HMO | Source: Ambulatory Visit | Attending: Internal Medicine | Admitting: Internal Medicine

## 2019-07-13 ENCOUNTER — Other Ambulatory Visit: Payer: Self-pay | Admitting: Internal Medicine

## 2019-07-13 DIAGNOSIS — M4316 Spondylolisthesis, lumbar region: Secondary | ICD-10-CM | POA: Diagnosis not present

## 2019-07-13 DIAGNOSIS — M5489 Other dorsalgia: Secondary | ICD-10-CM

## 2019-07-13 DIAGNOSIS — I10 Essential (primary) hypertension: Secondary | ICD-10-CM | POA: Diagnosis not present

## 2019-07-13 DIAGNOSIS — M4317 Spondylolisthesis, lumbosacral region: Secondary | ICD-10-CM | POA: Diagnosis not present

## 2019-07-13 DIAGNOSIS — E1169 Type 2 diabetes mellitus with other specified complication: Secondary | ICD-10-CM | POA: Diagnosis not present

## 2019-07-13 DIAGNOSIS — M545 Low back pain: Secondary | ICD-10-CM | POA: Diagnosis not present

## 2019-07-13 DIAGNOSIS — E7800 Pure hypercholesterolemia, unspecified: Secondary | ICD-10-CM | POA: Diagnosis not present

## 2019-08-19 DIAGNOSIS — M545 Low back pain: Secondary | ICD-10-CM | POA: Diagnosis not present

## 2019-08-19 DIAGNOSIS — R2681 Unsteadiness on feet: Secondary | ICD-10-CM | POA: Diagnosis not present

## 2019-08-19 DIAGNOSIS — R2689 Other abnormalities of gait and mobility: Secondary | ICD-10-CM | POA: Diagnosis not present

## 2019-08-19 DIAGNOSIS — M6281 Muscle weakness (generalized): Secondary | ICD-10-CM | POA: Diagnosis not present

## 2019-08-26 DIAGNOSIS — M6281 Muscle weakness (generalized): Secondary | ICD-10-CM | POA: Diagnosis not present

## 2019-08-26 DIAGNOSIS — R2689 Other abnormalities of gait and mobility: Secondary | ICD-10-CM | POA: Diagnosis not present

## 2019-08-26 DIAGNOSIS — M545 Low back pain: Secondary | ICD-10-CM | POA: Diagnosis not present

## 2019-08-26 DIAGNOSIS — R2681 Unsteadiness on feet: Secondary | ICD-10-CM | POA: Diagnosis not present

## 2019-08-27 DIAGNOSIS — M545 Low back pain: Secondary | ICD-10-CM | POA: Diagnosis not present

## 2019-08-27 DIAGNOSIS — R2681 Unsteadiness on feet: Secondary | ICD-10-CM | POA: Diagnosis not present

## 2019-08-27 DIAGNOSIS — M6281 Muscle weakness (generalized): Secondary | ICD-10-CM | POA: Diagnosis not present

## 2019-08-27 DIAGNOSIS — R2689 Other abnormalities of gait and mobility: Secondary | ICD-10-CM | POA: Diagnosis not present

## 2019-08-28 DIAGNOSIS — R2681 Unsteadiness on feet: Secondary | ICD-10-CM | POA: Diagnosis not present

## 2019-08-28 DIAGNOSIS — R2689 Other abnormalities of gait and mobility: Secondary | ICD-10-CM | POA: Diagnosis not present

## 2019-08-28 DIAGNOSIS — M545 Low back pain: Secondary | ICD-10-CM | POA: Diagnosis not present

## 2019-08-28 DIAGNOSIS — M6281 Muscle weakness (generalized): Secondary | ICD-10-CM | POA: Diagnosis not present

## 2019-08-31 DIAGNOSIS — M545 Low back pain: Secondary | ICD-10-CM | POA: Diagnosis not present

## 2019-08-31 DIAGNOSIS — R2681 Unsteadiness on feet: Secondary | ICD-10-CM | POA: Diagnosis not present

## 2019-08-31 DIAGNOSIS — R2689 Other abnormalities of gait and mobility: Secondary | ICD-10-CM | POA: Diagnosis not present

## 2019-08-31 DIAGNOSIS — M6281 Muscle weakness (generalized): Secondary | ICD-10-CM | POA: Diagnosis not present

## 2019-09-02 DIAGNOSIS — M6281 Muscle weakness (generalized): Secondary | ICD-10-CM | POA: Diagnosis not present

## 2019-09-02 DIAGNOSIS — R2689 Other abnormalities of gait and mobility: Secondary | ICD-10-CM | POA: Diagnosis not present

## 2019-09-02 DIAGNOSIS — M545 Low back pain: Secondary | ICD-10-CM | POA: Diagnosis not present

## 2019-09-02 DIAGNOSIS — R2681 Unsteadiness on feet: Secondary | ICD-10-CM | POA: Diagnosis not present

## 2019-09-07 DIAGNOSIS — M6281 Muscle weakness (generalized): Secondary | ICD-10-CM | POA: Diagnosis not present

## 2019-09-07 DIAGNOSIS — M545 Low back pain: Secondary | ICD-10-CM | POA: Diagnosis not present

## 2019-09-07 DIAGNOSIS — R2689 Other abnormalities of gait and mobility: Secondary | ICD-10-CM | POA: Diagnosis not present

## 2019-09-07 DIAGNOSIS — R2681 Unsteadiness on feet: Secondary | ICD-10-CM | POA: Diagnosis not present

## 2019-09-09 DIAGNOSIS — R2681 Unsteadiness on feet: Secondary | ICD-10-CM | POA: Diagnosis not present

## 2019-09-09 DIAGNOSIS — R2689 Other abnormalities of gait and mobility: Secondary | ICD-10-CM | POA: Diagnosis not present

## 2019-09-09 DIAGNOSIS — M6281 Muscle weakness (generalized): Secondary | ICD-10-CM | POA: Diagnosis not present

## 2019-09-09 DIAGNOSIS — M545 Low back pain: Secondary | ICD-10-CM | POA: Diagnosis not present

## 2019-09-14 DIAGNOSIS — M6281 Muscle weakness (generalized): Secondary | ICD-10-CM | POA: Diagnosis not present

## 2019-09-14 DIAGNOSIS — M545 Low back pain: Secondary | ICD-10-CM | POA: Diagnosis not present

## 2019-09-14 DIAGNOSIS — R2681 Unsteadiness on feet: Secondary | ICD-10-CM | POA: Diagnosis not present

## 2019-09-14 DIAGNOSIS — R2689 Other abnormalities of gait and mobility: Secondary | ICD-10-CM | POA: Diagnosis not present

## 2019-09-16 ENCOUNTER — Other Ambulatory Visit: Payer: Self-pay

## 2019-09-16 ENCOUNTER — Ambulatory Visit (INDEPENDENT_AMBULATORY_CARE_PROVIDER_SITE_OTHER): Payer: Medicare HMO | Admitting: Orthopaedic Surgery

## 2019-09-16 ENCOUNTER — Encounter: Payer: Self-pay | Admitting: Orthopaedic Surgery

## 2019-09-16 ENCOUNTER — Telehealth: Payer: Self-pay | Admitting: Radiology

## 2019-09-16 VITALS — Ht 60.0 in | Wt 165.0 lb

## 2019-09-16 DIAGNOSIS — M25562 Pain in left knee: Secondary | ICD-10-CM | POA: Diagnosis not present

## 2019-09-16 DIAGNOSIS — M25561 Pain in right knee: Secondary | ICD-10-CM

## 2019-09-16 DIAGNOSIS — M1712 Unilateral primary osteoarthritis, left knee: Secondary | ICD-10-CM | POA: Diagnosis not present

## 2019-09-16 DIAGNOSIS — M1711 Unilateral primary osteoarthritis, right knee: Secondary | ICD-10-CM | POA: Diagnosis not present

## 2019-09-16 DIAGNOSIS — M170 Bilateral primary osteoarthritis of knee: Secondary | ICD-10-CM

## 2019-09-16 DIAGNOSIS — G8929 Other chronic pain: Secondary | ICD-10-CM | POA: Diagnosis not present

## 2019-09-16 MED ORDER — METHYLPREDNISOLONE 4 MG PO TABS: ORAL_TABLET | ORAL | 0 refills | Status: DC

## 2019-09-16 NOTE — Progress Notes (Signed)
The patient is well-known to me.  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  In August we did place a steroid injection of both knees.  She has had hyaluronic acid in both knees before.  She says that helps a lot.  Today she says her knees are stiff but they are not very painful to her.  She would like to be considered for hyaluronic acid again.  Is been over 6 months since her last hyaluronic acid injections for her knees.  She does have well-documented osteoarthritis of both knees.  On exam both knees are stiff but have good range of motion.  There is no effusion with either knee but they are globally painful.  Of note she does state that Voltaren gel is helped as well.  I do feel it is medically reasonable to try hyaluronic acid again for both knees since that is helped her the most.  She will continue using her cane as well.  All question concerns were answered addressed.  She would like a 6-day steroid taper because that is helped in the past.  She is not a diabetic.  We will see her back hopefully in 4 weeks to place hyaluronic acid in both knees.

## 2019-09-16 NOTE — Telephone Encounter (Signed)
Please submit for authorization for gel injections bilateral knees.

## 2019-09-17 NOTE — Telephone Encounter (Signed)
Submitted online for VOB, Monovisc.

## 2019-09-18 NOTE — Telephone Encounter (Signed)
I called patient and LM advising the below, she has an appt already scheduled.

## 2019-09-18 NOTE — Telephone Encounter (Signed)
Per VOB, buy and bill ok, no PA needed, patient will owe 20% and havea  $45 copay.

## 2019-09-21 DIAGNOSIS — M6281 Muscle weakness (generalized): Secondary | ICD-10-CM | POA: Diagnosis not present

## 2019-09-21 DIAGNOSIS — M545 Low back pain: Secondary | ICD-10-CM | POA: Diagnosis not present

## 2019-09-21 DIAGNOSIS — R2681 Unsteadiness on feet: Secondary | ICD-10-CM | POA: Diagnosis not present

## 2019-09-21 DIAGNOSIS — R2689 Other abnormalities of gait and mobility: Secondary | ICD-10-CM | POA: Diagnosis not present

## 2019-09-23 DIAGNOSIS — M6281 Muscle weakness (generalized): Secondary | ICD-10-CM | POA: Diagnosis not present

## 2019-09-23 DIAGNOSIS — M545 Low back pain: Secondary | ICD-10-CM | POA: Diagnosis not present

## 2019-09-23 DIAGNOSIS — R2681 Unsteadiness on feet: Secondary | ICD-10-CM | POA: Diagnosis not present

## 2019-09-23 DIAGNOSIS — R2689 Other abnormalities of gait and mobility: Secondary | ICD-10-CM | POA: Diagnosis not present

## 2019-09-28 DIAGNOSIS — M6281 Muscle weakness (generalized): Secondary | ICD-10-CM | POA: Diagnosis not present

## 2019-09-28 DIAGNOSIS — R2689 Other abnormalities of gait and mobility: Secondary | ICD-10-CM | POA: Diagnosis not present

## 2019-09-28 DIAGNOSIS — R2681 Unsteadiness on feet: Secondary | ICD-10-CM | POA: Diagnosis not present

## 2019-09-28 DIAGNOSIS — M545 Low back pain: Secondary | ICD-10-CM | POA: Diagnosis not present

## 2019-10-14 ENCOUNTER — Encounter: Payer: Self-pay | Admitting: Orthopaedic Surgery

## 2019-10-14 ENCOUNTER — Ambulatory Visit (INDEPENDENT_AMBULATORY_CARE_PROVIDER_SITE_OTHER): Payer: Medicare HMO | Admitting: Orthopaedic Surgery

## 2019-10-14 ENCOUNTER — Other Ambulatory Visit: Payer: Self-pay

## 2019-10-14 DIAGNOSIS — M1712 Unilateral primary osteoarthritis, left knee: Secondary | ICD-10-CM | POA: Diagnosis not present

## 2019-10-14 DIAGNOSIS — M170 Bilateral primary osteoarthritis of knee: Secondary | ICD-10-CM | POA: Diagnosis not present

## 2019-10-14 DIAGNOSIS — M1711 Unilateral primary osteoarthritis, right knee: Secondary | ICD-10-CM

## 2019-10-14 MED ORDER — HYALURONAN 88 MG/4ML IX SOSY
88.0000 mg | PREFILLED_SYRINGE | INTRA_ARTICULAR | Status: AC | PRN
  Administered 2019-10-14: 88 mg via INTRA_ARTICULAR

## 2019-10-14 NOTE — Progress Notes (Signed)
   Procedure Note  Patient: Diane Snow             Date of Birth: 1946-08-30           MRN: [id number]             Visit Date: 10/14/2019  Procedures: Visit Diagnoses:  1. Unilateral primary osteoarthritis, left knee   2. Unilateral primary osteoarthritis, right knee     Large Joint Inj: R knee on 10/14/2019 1:17 PM Indications: diagnostic evaluation and pain Details: 22 G 1.5 in needle, fluoroscopy-guided superolateral approach  Arthrogram: No  Medications: 88 mg Hyaluronan 88 MG/4ML Outcome: tolerated well, no immediate complications Procedure, treatment alternatives, risks and benefits explained, specific risks discussed. Consent was given by the patient. Immediately prior to procedure a time out was called to verify the correct patient, procedure, equipment, support staff and site/side marked as required. Patient was prepped and draped in the usual sterile fashion.   Large Joint Inj: L knee on 10/14/2019 1:18 PM Indications: diagnostic evaluation and pain Details: 22 G 1.5 in needle, superolateral approach  Arthrogram: No  Medications: 88 mg Hyaluronan 88 MG/4ML Outcome: tolerated well, no immediate complications Procedure, treatment alternatives, risks and benefits explained, specific risks discussed. Consent was given by the patient. Immediately prior to procedure a time out was called to verify the correct patient, procedure, equipment, support staff and site/side marked as required. Patient was prepped and draped in the usual sterile fashion.    The patient comes in today for scheduled hyaluronic acid injections with Monovisc in each knee to treat the pain from osteoarthritis.  She says right now she is not having as much pain she is to have.  She has had steroid injections as well.  She understands fully why we are trying these injections today.  Both knees show no effusion but definitely varus malalignment and arthritic changes.  She has good range of motion of both  knees with some pain.  I did place Monovisc in each knee without difficulty.  All question concerns were answered and addressed.  Follow-up can be as needed.

## 2019-11-21 ENCOUNTER — Ambulatory Visit: Payer: Medicare HMO | Attending: Internal Medicine

## 2019-11-21 DIAGNOSIS — Z23 Encounter for immunization: Secondary | ICD-10-CM | POA: Insufficient documentation

## 2019-11-27 ENCOUNTER — Other Ambulatory Visit: Payer: Self-pay | Admitting: Internal Medicine

## 2019-11-27 DIAGNOSIS — Z1231 Encounter for screening mammogram for malignant neoplasm of breast: Secondary | ICD-10-CM

## 2019-12-19 ENCOUNTER — Ambulatory Visit: Payer: Medicare HMO

## 2019-12-21 DIAGNOSIS — Z01419 Encounter for gynecological examination (general) (routine) without abnormal findings: Secondary | ICD-10-CM | POA: Diagnosis not present

## 2019-12-26 ENCOUNTER — Ambulatory Visit: Payer: Medicare HMO | Attending: Internal Medicine

## 2019-12-26 DIAGNOSIS — Z23 Encounter for immunization: Secondary | ICD-10-CM | POA: Insufficient documentation

## 2019-12-26 NOTE — Progress Notes (Signed)
   Covid-19 Vaccination Clinic  Name:  Adilah Wollam    MRN: [id number] DOB: [birth year]  12/26/2019  Ms. Fontes was observed post Covid-19 immunization for 15 minutes without incidence. She was provided with Vaccine Information Sheet and instruction to access the V-Safe system.   Ms. Gedney was instructed to call 911 with any severe reactions post vaccine: Marland Kitchen Difficulty breathing  . Swelling of your face and throat  . A fast heartbeat  . A bad rash all over your body  . Dizziness and weakness    Immunizations Administered    Name Date Dose VIS Date Route   Moderna COVID-19 Vaccine 12/26/2019 10:06 AM 0.5 mL 09/29/2019 Intramuscular   Manufacturer: Moderna   Lot: [id number]   [id number]

## 2019-12-28 ENCOUNTER — Other Ambulatory Visit: Payer: Self-pay | Admitting: Internal Medicine

## 2019-12-28 ENCOUNTER — Ambulatory Visit
Admission: RE | Admit: 2019-12-28 | Discharge: 2019-12-28 | Disposition: A | Discharge status: Home / Self Care | Payer: Medicare HMO | Source: Ambulatory Visit | Attending: Internal Medicine | Admitting: Internal Medicine

## 2019-12-28 DIAGNOSIS — R531 Weakness: Secondary | ICD-10-CM

## 2019-12-28 DIAGNOSIS — I10 Essential (primary) hypertension: Secondary | ICD-10-CM | POA: Diagnosis not present

## 2019-12-28 DIAGNOSIS — R600 Localized edema: Secondary | ICD-10-CM | POA: Diagnosis not present

## 2020-01-01 DIAGNOSIS — R531 Weakness: Secondary | ICD-10-CM | POA: Diagnosis not present

## 2020-01-04 DIAGNOSIS — R269 Unspecified abnormalities of gait and mobility: Secondary | ICD-10-CM | POA: Diagnosis not present

## 2020-01-04 DIAGNOSIS — R609 Edema, unspecified: Secondary | ICD-10-CM | POA: Diagnosis not present

## 2020-01-04 DIAGNOSIS — R531 Weakness: Secondary | ICD-10-CM | POA: Diagnosis not present

## 2020-01-06 ENCOUNTER — Ambulatory Visit
Admission: RE | Admit: 2020-01-06 | Discharge: 2020-01-06 | Disposition: A | Discharge status: Home / Self Care | Payer: Medicare HMO | Source: Ambulatory Visit | Attending: Internal Medicine | Admitting: Internal Medicine

## 2020-01-06 ENCOUNTER — Other Ambulatory Visit: Payer: Self-pay

## 2020-01-06 DIAGNOSIS — Z1231 Encounter for screening mammogram for malignant neoplasm of breast: Secondary | ICD-10-CM

## 2020-01-12 ENCOUNTER — Emergency Department (HOSPITAL_COMMUNITY): Payer: Medicare HMO

## 2020-01-12 ENCOUNTER — Emergency Department (HOSPITAL_COMMUNITY)
Admission: EM | Admit: 2020-01-12 | Discharge: 2020-01-12 | Disposition: A | Discharge status: Home / Self Care | Payer: Medicare HMO | Attending: Emergency Medicine | Admitting: Emergency Medicine

## 2020-01-12 ENCOUNTER — Encounter (HOSPITAL_COMMUNITY): Payer: Self-pay | Admitting: Emergency Medicine

## 2020-01-12 ENCOUNTER — Other Ambulatory Visit: Payer: Self-pay

## 2020-01-12 DIAGNOSIS — Z7982 Long term (current) use of aspirin: Secondary | ICD-10-CM | POA: Diagnosis not present

## 2020-01-12 DIAGNOSIS — S0990XA Unspecified injury of head, initial encounter: Secondary | ICD-10-CM | POA: Diagnosis not present

## 2020-01-12 DIAGNOSIS — Y92002 Bathroom of unspecified non-institutional (private) residence single-family (private) house as the place of occurrence of the external cause: Secondary | ICD-10-CM | POA: Insufficient documentation

## 2020-01-12 DIAGNOSIS — S0083XA Contusion of other part of head, initial encounter: Secondary | ICD-10-CM | POA: Diagnosis not present

## 2020-01-12 DIAGNOSIS — S0093XA Contusion of unspecified part of head, initial encounter: Secondary | ICD-10-CM

## 2020-01-12 DIAGNOSIS — W01198A Fall on same level from slipping, tripping and stumbling with subsequent striking against other object, initial encounter: Secondary | ICD-10-CM | POA: Insufficient documentation

## 2020-01-12 DIAGNOSIS — Y999 Unspecified external cause status: Secondary | ICD-10-CM | POA: Insufficient documentation

## 2020-01-12 DIAGNOSIS — G8929 Other chronic pain: Secondary | ICD-10-CM | POA: Diagnosis not present

## 2020-01-12 DIAGNOSIS — I10 Essential (primary) hypertension: Secondary | ICD-10-CM | POA: Diagnosis not present

## 2020-01-12 DIAGNOSIS — Z79899 Other long term (current) drug therapy: Secondary | ICD-10-CM | POA: Diagnosis not present

## 2020-01-12 DIAGNOSIS — Z87891 Personal history of nicotine dependence: Secondary | ICD-10-CM | POA: Insufficient documentation

## 2020-01-12 DIAGNOSIS — M25561 Pain in right knee: Secondary | ICD-10-CM | POA: Diagnosis not present

## 2020-01-12 DIAGNOSIS — S161XXA Strain of muscle, fascia and tendon at neck level, initial encounter: Secondary | ICD-10-CM | POA: Diagnosis not present

## 2020-01-12 DIAGNOSIS — S199XXA Unspecified injury of neck, initial encounter: Secondary | ICD-10-CM | POA: Diagnosis not present

## 2020-01-12 DIAGNOSIS — W19XXXA Unspecified fall, initial encounter: Secondary | ICD-10-CM

## 2020-01-12 DIAGNOSIS — S0993XA Unspecified injury of face, initial encounter: Secondary | ICD-10-CM | POA: Diagnosis present

## 2020-01-12 DIAGNOSIS — R531 Weakness: Secondary | ICD-10-CM | POA: Diagnosis not present

## 2020-01-12 DIAGNOSIS — Y9389 Activity, other specified: Secondary | ICD-10-CM | POA: Insufficient documentation

## 2020-01-12 NOTE — ED Notes (Signed)
Case manager at bedside 

## 2020-01-12 NOTE — TOC Initial Note (Signed)
Transition of Care [facility]) - Initial/Assessment Note    Patient Details  Name: Diane Snow MRN: [id number] Date of Birth: 04/05/1946  [facility] CM/SW Contact:    Diane Rasher, RN Phone Number: [phone] 01/12/2020, 6:20 PM  Clinical Narrative:                 TOC CM spoke to pt and sister in law, Diane Snow at bedside. Pt states she lives at home alone. Sister in law assist with her care at home if needed. She has a cane. Offered choice for [facility]. Pt agreeable to [facility]. Contacted [facility] rep for [place]. Waiting confirmation. [place] for RW with seat and 3n1 bedside commode. Rollator Delivered to room. Pt declined 3n1 bedside commode.   Expected Discharge Plan: [place] Barriers to Discharge: No Barriers Identified   Patient Goals and CMS Choice Patient states their goals for this hospitalization and ongoing recovery are:: would like Physical Therapy that works for me CMS Medicare.gov Compare Post Acute Care list provided to:: Patient Choice offered to / list presented to : Patient  Expected Discharge Plan and Services Expected Discharge Plan: [place] In-house Referral: Clinical Social Work Discharge Planning Services: CM Consult Post Acute Care Choice: Home Health, Durable Medical Equipment Living arrangements for the past 2 months: [place]                 DME Arranged: 3-N-1, Walker rolling with seat DME Agency: [employer] Date DME Agency Contacted: 01/12/20 Time DME Agency Contacted: [phone] Representative spoke with at DME Agency: [place]: PT, OT [place] Agency: [facility] (formerly [employer]) Date [place]: 01/12/20 Time [place]: 1818 Representative spoke with at [place]: Diane Snow  Prior Living Arrangements/Services Living arrangements for the past 2 months: [place] with:: Self Patient language and need for  interpreter reviewed:: Yes Do you feel safe going back to the place where you live?: Yes      Need for Family Participation in Patient Care: No (Comment) Care giver support system in place?: No (comment) Current home services: DME(cane) Criminal Activity/Legal Involvement Pertinent to Current Situation/Hospitalization: No - Comment as needed  Activities of Daily Living      Permission Sought/Granted Permission sought to share information with : Case Manager Permission granted to share information with : Yes, Verbal Permission Granted  Share Information with NAME: Diane Snow  Permission granted to share info w AGENCY: [place] granted to share info w Relationship: sister in law  Permission granted to share info w Contact Information: [phone]  Emotional Assessment Appearance:: Appears stated age Attitude/Demeanor/Rapport: Engaged Affect (typically observed): Accepting Orientation: : Oriented to Self, Oriented to Place, Oriented to Situation, Oriented to  Time   Psych Involvement: No (comment)  Admission diagnosis:  Fall, Knot on head, Leg swelling Patient Active Problem List   Diagnosis Date Noted  . Chronic pain of right knee 06/15/2019  . Unilateral primary osteoarthritis, right knee 06/15/2019  . Chronic pain of left knee 05/12/2018  . Unilateral primary osteoarthritis, left knee 05/12/2018  . Essential hypertension 11/16/2015  . Hx of adenomatous colonic polyps 09/27/2014   PCP:  Diane Carol, MD Pharmacy:   [facility] [place], [place] - [address] [place] [address] Phone: [phone] Fax: [phone]  Social Determinants of Health (SDOH) Interventions    Readmission Risk Interventions No flowsheet data found.

## 2020-01-12 NOTE — Discharge Instructions (Addendum)
Please use an ice pack to the hematoma on your head which is a bruise.  Please return to ED if you have any new or concerning symptoms.  Please follow-up with your primary care doctor.

## 2020-01-12 NOTE — ED Notes (Signed)
ED Provider at bedside. 

## 2020-01-12 NOTE — ED Triage Notes (Signed)
Pt reports that she was rushing to get to the bathroom this morning and fell. Pt has swelling to forehead. Denies LOC or taking blood thinners.

## 2020-01-12 NOTE — ED Provider Notes (Addendum)
[facility] DEPT Provider Note   CSN: [id number] Arrival date & time: 01/12/20  1341     History Chief Complaint  Patient presents with  . Fall  . Head Injury    Diane Snow is a 74 y.o. female   HPI Patient is a 74 year old female with history of chronic bilateral knee pain and osteoarthritis, HTN  Patient resents today for fall that occurred this morning when she was rushing to the bathroom because she had to urinate.  She states that she was unable to use the bathroom in time and peed onto the ground and slipped on the urine causing her to fall and strike her head against the floor.  She states that she did not lose consciousness.  She denies any lightheadedness or dizziness preceding or after the fall.  Denies any chest pain, palpitations, shortness of breath.  She denies any history of anemia, diabetes, hypoglycemia or blood clots.  She denies any history of stroke.  She does state that she has had some left-sided arm and leg weakness for the past year and states she had CT done recently by her PCP to evaluate for this.  My review of EMR patient has had CT scan without contrast 1/21.  Is without acute abnormality but did show some chronic microvascular ischemic changes with possible small infarct versus artifact in the left paracentral pons.  Patient denies any new  lower extremities or upper extremity weakness.       Past Medical History:  Diagnosis Date  . Allergy   . Arthritis   . Hx of adenomatous colonic polyps 09/27/2014  . Hypertension     Patient Active Problem List   Diagnosis Date Noted  . Chronic pain of right knee 06/15/2019  . Unilateral primary osteoarthritis, right knee 06/15/2019  . Chronic pain of left knee 05/12/2018  . Unilateral primary osteoarthritis, left knee 05/12/2018  . Essential hypertension 11/16/2015  . Hx of adenomatous colonic polyps 09/27/2014    Past Surgical History:  Procedure Laterality Date    . BREAST BIOPSY Left 1976  . BREAST EXCISIONAL BIOPSY Left   . COLONOSCOPY    . PARTIAL HYSTERECTOMY       OB History   No obstetric history on file.     Family History  Problem Relation Age of Onset  . Breast cancer Maternal Aunt   . Colon cancer Neg Hx   . Rectal cancer Neg Hx   . Stomach cancer Neg Hx   . Esophageal cancer Neg Hx   . Pancreatic cancer Neg Hx     Social History   Tobacco Use  . Smoking status: Former Smoker    Quit date: 03/30/2011    Years since quitting: 8.7  . Smokeless tobacco: Never Used  Substance Use Topics  . Alcohol use: Yes    Alcohol/week: 0.0 standard drinks    Comment: occasionally  . Drug use: No    Home Medications Prior to Admission medications   Medication Sig Start Date End Date Taking? Authorizing Provider  amLODipine (NORVASC) 10 MG tablet Take 10 mg by mouth daily.    [provider]  aspirin 81 MG tablet Take 81 mg by mouth daily.    [provider]  calcium carbonate (TUMS - DOSED IN MG ELEMENTAL CALCIUM) 500 MG chewable tablet Chew 1 tablet by mouth daily.    [provider]  diazepam (VALIUM) 5 MG tablet TK 1 T PO 1 HOUR PRIOR TO APPOINTMENT 10/27/18  [provider]  diclofenac sodium (VOLTAREN) 1 % GEL Apply 4 g topically 4 (four) times daily. 02/11/19   Pete Pelt, PA-C  famotidine (PEPCID) 20 MG tablet Take 20 mg by mouth 2 (two) times daily.    [provider]  HYDROcodone-acetaminophen (NORCO) 10-325 MG tablet TAKE 1 TABLET BY MOUTH EVERY 4 TO 6 HOURS AS NEEDED FOR PAIN 08/19/18   [provider]  loratadine (CLARITIN) 10 MG tablet Take 10 mg by mouth daily.    [provider]  methylPREDNISolone (MEDROL) 4 MG tablet Medrol dose pack. Take as instructed 09/16/19   Mcarthur Rossetti, MD  nabumetone (RELAFEN) 500 MG tablet TK 2 T PO BID WF PRN FOR KNEE PAIN 05/12/18   [provider]  potassium chloride (K-DUR,KLOR-CON) 10 MEQ tablet Take  10 mEq by mouth daily. 04/29/18   [provider]  triamcinolone cream (KENALOG) 0.1 % APP AA BID PRN 07/24/18   [provider]    Allergies    Aspirin  Review of Systems   Review of Systems  Constitutional: Negative for chills and fever.  HENT: Negative for congestion.   Eyes: Negative for pain.  Respiratory: Negative for cough and shortness of breath.   Cardiovascular: Negative for chest pain and leg swelling.  Gastrointestinal: Negative for abdominal pain and vomiting.  Genitourinary: Negative for dysuria.  Musculoskeletal: Negative for myalgias.       "goose egg"  Skin: Negative for rash.  Neurological: Positive for headaches. Negative for dizziness.    Physical Exam Updated Vital Signs BP (!) 148/89 (BP Location: Left Arm)   Pulse 69   Temp 98.1 F (36.7 C) (Oral)   Resp 18   SpO2 100%   Physical Exam Vitals and nursing note reviewed.  Constitutional:      General: She is not in acute distress.    Appearance: Normal appearance. She is not ill-appearing.  HENT:     Head: Normocephalic.     Comments: Right-sided hematoma of the frontal/parietal cranium.  Some tenderness to palpation.    Mouth/Throat:     Mouth: Mucous membranes are moist.  Eyes:     General: No scleral icterus.       Right eye: No discharge.        Left eye: No discharge.     Extraocular Movements: Extraocular movements intact.     Conjunctiva/sclera: Conjunctivae normal.  Neck:     Comments: Mild tenderness to palpation posterior neck.  Full range of motion of C-spine. Cardiovascular:     Rate and Rhythm: Normal rate and regular rhythm.  Pulmonary:     Effort: Pulmonary effort is normal.     Breath sounds: No stridor.  Abdominal:     Palpations: Abdomen is soft.     Tenderness: There is no abdominal tenderness.  Musculoskeletal:     Cervical back: Normal range of motion and neck supple.  Neurological:     Mental Status: She is alert and oriented to person, place, and  time. Mental status is at baseline.     Comments: Alert and oriented to self, place, time and event.   Speech is fluent, clear without dysarthria or dysphasia.   Strength 5/5 in right upper/lower extremities given patient's stature and slight muscle bulk.  Strength 4/5 in flexion extension of left upper extremity at the elbow.  This is consistent with patient's baseline.  Sensation intact in upper/lower extremities   Patient able to ambulate however gait is hesitant and slow  consistent with baseline per patient and friend at bedside. Negative Romberg. No pronator drift.  Normal finger-to-nose and feet tapping.  CN I not tested  CN II grossly intact visual fields bilaterally. Did not visualize posterior eye.   CN III, IV, VI PERRLA and EOMs intact bilaterally  CN V Intact sensation to sharp and light touch to the face  CN VII facial movements symmetric  CN VIII not tested  CN IX, X no uvula deviation, symmetric rise of soft palate  CN XI 5/5 SCM and trapezius strength bilaterally  CN XII Midline tongue protrusion, symmetric L/R movements      ED Results / Procedures / Treatments   Labs (all labs ordered are listed, but only abnormal results are displayed) Labs Reviewed - No data to display  EKG None  Radiology CT Head Wo Contrast  Result Date: 01/12/2020 CLINICAL DATA:  Fall with frontal hematoma.  Left arm weakness. EXAM: CT HEAD WITHOUT CONTRAST TECHNIQUE: Contiguous axial images were obtained from the base of the skull through the vertex without intravenous contrast. COMPARISON:  December 28, 2019 FINDINGS: Brain: No evidence of acute infarction, hemorrhage, hydrocephalus, extra-axial collection or mass lesion/mass effect. Bilateral cerebral white matter ischemic changes. Vascular: No hyperdense vessel. Atherosclerotic calcification of the bilateral carotid siphons. Skull: Intact. Moderately sized subgaleal hematoma in the right frontal region. Chronic scar in the right parietal  region. Sinuses/Orbits: Mild paranasal sinus mucosal thickening in the bilateral ethmoidal and maxillary sinuses. Remote surgical ostomy of the right medial maxillary wall. Other: None. IMPRESSION: No acute intracranial hemorrhage or ischemia. Bilateral cerebral white matter microvascular ischemic change and intracranial calcified atherosclerosis. Right frontal subgaleal hematoma without skull fracture. Mild paranasal sinus mucoperiosteal disease. Electronically Signed   By: Revonda Humphrey   On: 01/12/2020 15:40   CT CERVICAL SPINE WO CONTRAST  Result Date: 01/12/2020 CLINICAL DATA:  Fall with facial trauma. Left arm weakness. EXAM: CT CERVICAL SPINE WITHOUT CONTRAST TECHNIQUE: Multidetector CT imaging of the cervical spine was performed without intravenous contrast. Multiplanar CT image reconstructions were also generated. COMPARISON:  None. FINDINGS: Alignment: Grade 1 retrolistheses of C4 on C5, C5 on C6, and C7 on T1, likely degenerative with acquired disc disease at these levels most pronounced and moderate in extent at the cervicothoracic junction. Moderate osseous narrowing at the right C4-5 and left C3-4 neural foramina. Straightening of the cervical lordosis. 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 No acute fracture. No primary bone lesion or focal pathologic process. Soft tissues and spinal canal: No prevertebral fluid or swelling. No visible canal hematoma. Disc levels: Diffuse moderate degenerative disc space narrowing with vacuum disc phenomena at C5-6. no posttraumatic disruption or displacement. Upper chest: No apical pneumothorax. Other: Atherosclerotic calcification of the bilateral carotid arterial system. IMPRESSION: No CT evidence of acute fracture or malalignment of the cervical spine. 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 Moderate  osseous narrowing at the right C4-5 and left C3-4 neural foramina. Electronically Signed   By: Revonda Humphrey   On: 01/12/2020 15:53    Procedures Procedures (including critical care time)  Medications Ordered in ED Medications - No data to display  ED Course  I have reviewed the triage vital signs and the nursing notes.  Pertinent labs & imaging results that were available during my care of the patient were reviewed by me and considered in my  medical decision making (see chart for details).    MDM Rules/Calculators/A&P                      Patient is 74 year old female with a history of left-sided weakness has been ongoing for approximately 1 year.  She states that she had a mechanical fall this morning, several hours ago she fell forward in the bathroom and struck her head.  She denies any loss of consciousness, headache or dizziness.  She states she does have some pain in the superficial part of her face and she has a hematoma.  On exam there is a hematoma right frontal cranium.  She is neurologically intact but does have some decreased strength with flexion extension of her left arm.  Bilateral lower extremities with symmetric strength.  She has hesitant gait however all these abnormalities are normal for patient the past year per patient and her friend at bedside.  CT scan of the bilirubin result shows a hematoma of the skull consistent with my exam however there is no intracranial hemorrhage or intracranial abnormalities.  I discussed this case with my attending physician who cosigned this note including patient's presenting symptoms, physical exam, and planned diagnostics and interventions. Attending physician stated agreement with plan or made changes to plan which were implemented.   Attending physician assessed patient at bedside.  We will discharge patient with follow-up with your primary care doctor. Patient discharged with walker--TOC team consulted and able to provide patient  with this.   Final Clinical Impression(s) / ED Diagnoses Final diagnoses:  Injury of head, initial encounter  Fall, initial encounter  Traumatic hematoma of head, initial encounter  Strain of neck muscle, initial encounter    Rx / DC Orders ED Discharge Orders    None       Tedd Sias, [place] 01/12/20 1625    Sherwood Gambler, MD 01/12/20 1628    Tedd Sias, PA 01/12/20 1631    Sherwood Gambler, MD 01/17/20 1504

## 2020-01-16 DIAGNOSIS — Z7982 Long term (current) use of aspirin: Secondary | ICD-10-CM | POA: Diagnosis not present

## 2020-01-16 DIAGNOSIS — S161XXD Strain of muscle, fascia and tendon at neck level, subsequent encounter: Secondary | ICD-10-CM | POA: Diagnosis not present

## 2020-01-16 DIAGNOSIS — Z9181 History of falling: Secondary | ICD-10-CM | POA: Diagnosis not present

## 2020-01-16 DIAGNOSIS — M170 Bilateral primary osteoarthritis of knee: Secondary | ICD-10-CM | POA: Diagnosis not present

## 2020-01-16 DIAGNOSIS — Z87891 Personal history of nicotine dependence: Secondary | ICD-10-CM | POA: Diagnosis not present

## 2020-01-16 DIAGNOSIS — S065X0D Traumatic subdural hemorrhage without loss of consciousness, subsequent encounter: Secondary | ICD-10-CM | POA: Diagnosis not present

## 2020-01-16 DIAGNOSIS — I10 Essential (primary) hypertension: Secondary | ICD-10-CM | POA: Diagnosis not present

## 2020-01-18 DIAGNOSIS — I10 Essential (primary) hypertension: Secondary | ICD-10-CM | POA: Diagnosis not present

## 2020-01-18 DIAGNOSIS — Z9181 History of falling: Secondary | ICD-10-CM | POA: Diagnosis not present

## 2020-01-18 DIAGNOSIS — S065X0D Traumatic subdural hemorrhage without loss of consciousness, subsequent encounter: Secondary | ICD-10-CM | POA: Diagnosis not present

## 2020-01-18 DIAGNOSIS — S161XXD Strain of muscle, fascia and tendon at neck level, subsequent encounter: Secondary | ICD-10-CM | POA: Diagnosis not present

## 2020-01-18 DIAGNOSIS — Z7982 Long term (current) use of aspirin: Secondary | ICD-10-CM | POA: Diagnosis not present

## 2020-01-18 DIAGNOSIS — M170 Bilateral primary osteoarthritis of knee: Secondary | ICD-10-CM | POA: Diagnosis not present

## 2020-01-18 DIAGNOSIS — Z87891 Personal history of nicotine dependence: Secondary | ICD-10-CM | POA: Diagnosis not present

## 2020-01-22 DIAGNOSIS — Z7982 Long term (current) use of aspirin: Secondary | ICD-10-CM | POA: Diagnosis not present

## 2020-01-22 DIAGNOSIS — S065X0D Traumatic subdural hemorrhage without loss of consciousness, subsequent encounter: Secondary | ICD-10-CM | POA: Diagnosis not present

## 2020-01-22 DIAGNOSIS — M170 Bilateral primary osteoarthritis of knee: Secondary | ICD-10-CM | POA: Diagnosis not present

## 2020-01-22 DIAGNOSIS — I10 Essential (primary) hypertension: Secondary | ICD-10-CM | POA: Diagnosis not present

## 2020-01-22 DIAGNOSIS — S161XXD Strain of muscle, fascia and tendon at neck level, subsequent encounter: Secondary | ICD-10-CM | POA: Diagnosis not present

## 2020-01-22 DIAGNOSIS — Z9181 History of falling: Secondary | ICD-10-CM | POA: Diagnosis not present

## 2020-01-22 DIAGNOSIS — Z87891 Personal history of nicotine dependence: Secondary | ICD-10-CM | POA: Diagnosis not present

## 2020-01-26 DIAGNOSIS — Z7982 Long term (current) use of aspirin: Secondary | ICD-10-CM | POA: Diagnosis not present

## 2020-01-26 DIAGNOSIS — S161XXD Strain of muscle, fascia and tendon at neck level, subsequent encounter: Secondary | ICD-10-CM | POA: Diagnosis not present

## 2020-01-26 DIAGNOSIS — Z87891 Personal history of nicotine dependence: Secondary | ICD-10-CM | POA: Diagnosis not present

## 2020-01-26 DIAGNOSIS — Z9181 History of falling: Secondary | ICD-10-CM | POA: Diagnosis not present

## 2020-01-26 DIAGNOSIS — M170 Bilateral primary osteoarthritis of knee: Secondary | ICD-10-CM | POA: Diagnosis not present

## 2020-01-26 DIAGNOSIS — S065X0D Traumatic subdural hemorrhage without loss of consciousness, subsequent encounter: Secondary | ICD-10-CM | POA: Diagnosis not present

## 2020-01-26 DIAGNOSIS — I10 Essential (primary) hypertension: Secondary | ICD-10-CM | POA: Diagnosis not present

## 2020-01-27 DIAGNOSIS — S065X0D Traumatic subdural hemorrhage without loss of consciousness, subsequent encounter: Secondary | ICD-10-CM | POA: Diagnosis not present

## 2020-01-27 DIAGNOSIS — I10 Essential (primary) hypertension: Secondary | ICD-10-CM | POA: Diagnosis not present

## 2020-01-27 DIAGNOSIS — S161XXD Strain of muscle, fascia and tendon at neck level, subsequent encounter: Secondary | ICD-10-CM | POA: Diagnosis not present

## 2020-01-27 DIAGNOSIS — Z87891 Personal history of nicotine dependence: Secondary | ICD-10-CM | POA: Diagnosis not present

## 2020-01-27 DIAGNOSIS — M170 Bilateral primary osteoarthritis of knee: Secondary | ICD-10-CM | POA: Diagnosis not present

## 2020-01-27 DIAGNOSIS — Z9181 History of falling: Secondary | ICD-10-CM | POA: Diagnosis not present

## 2020-01-27 DIAGNOSIS — Z7982 Long term (current) use of aspirin: Secondary | ICD-10-CM | POA: Diagnosis not present

## 2020-01-28 DIAGNOSIS — I10 Essential (primary) hypertension: Secondary | ICD-10-CM | POA: Diagnosis not present

## 2020-01-28 DIAGNOSIS — Z7982 Long term (current) use of aspirin: Secondary | ICD-10-CM | POA: Diagnosis not present

## 2020-01-28 DIAGNOSIS — Z87891 Personal history of nicotine dependence: Secondary | ICD-10-CM | POA: Diagnosis not present

## 2020-01-28 DIAGNOSIS — S065X0D Traumatic subdural hemorrhage without loss of consciousness, subsequent encounter: Secondary | ICD-10-CM | POA: Diagnosis not present

## 2020-01-28 DIAGNOSIS — Z9181 History of falling: Secondary | ICD-10-CM | POA: Diagnosis not present

## 2020-01-28 DIAGNOSIS — M170 Bilateral primary osteoarthritis of knee: Secondary | ICD-10-CM | POA: Diagnosis not present

## 2020-01-28 DIAGNOSIS — S161XXD Strain of muscle, fascia and tendon at neck level, subsequent encounter: Secondary | ICD-10-CM | POA: Diagnosis not present

## 2020-01-29 DIAGNOSIS — Z87891 Personal history of nicotine dependence: Secondary | ICD-10-CM | POA: Diagnosis not present

## 2020-01-29 DIAGNOSIS — I10 Essential (primary) hypertension: Secondary | ICD-10-CM | POA: Diagnosis not present

## 2020-01-29 DIAGNOSIS — S161XXD Strain of muscle, fascia and tendon at neck level, subsequent encounter: Secondary | ICD-10-CM | POA: Diagnosis not present

## 2020-01-29 DIAGNOSIS — Z9181 History of falling: Secondary | ICD-10-CM | POA: Diagnosis not present

## 2020-01-29 DIAGNOSIS — S065X0D Traumatic subdural hemorrhage without loss of consciousness, subsequent encounter: Secondary | ICD-10-CM | POA: Diagnosis not present

## 2020-01-29 DIAGNOSIS — M170 Bilateral primary osteoarthritis of knee: Secondary | ICD-10-CM | POA: Diagnosis not present

## 2020-01-29 DIAGNOSIS — Z7982 Long term (current) use of aspirin: Secondary | ICD-10-CM | POA: Diagnosis not present

## 2020-02-01 ENCOUNTER — Other Ambulatory Visit: Payer: Self-pay | Admitting: Internal Medicine

## 2020-02-01 DIAGNOSIS — Z1389 Encounter for screening for other disorder: Secondary | ICD-10-CM | POA: Diagnosis not present

## 2020-02-01 DIAGNOSIS — S161XXD Strain of muscle, fascia and tendon at neck level, subsequent encounter: Secondary | ICD-10-CM | POA: Diagnosis not present

## 2020-02-01 DIAGNOSIS — E2839 Other primary ovarian failure: Secondary | ICD-10-CM

## 2020-02-01 DIAGNOSIS — R202 Paresthesia of skin: Secondary | ICD-10-CM | POA: Diagnosis not present

## 2020-02-01 DIAGNOSIS — S065X0D Traumatic subdural hemorrhage without loss of consciousness, subsequent encounter: Secondary | ICD-10-CM | POA: Diagnosis not present

## 2020-02-01 DIAGNOSIS — E7800 Pure hypercholesterolemia, unspecified: Secondary | ICD-10-CM | POA: Diagnosis not present

## 2020-02-01 DIAGNOSIS — Z9181 History of falling: Secondary | ICD-10-CM | POA: Diagnosis not present

## 2020-02-01 DIAGNOSIS — Z0000 Encounter for general adult medical examination without abnormal findings: Secondary | ICD-10-CM | POA: Diagnosis not present

## 2020-02-01 DIAGNOSIS — Z7982 Long term (current) use of aspirin: Secondary | ICD-10-CM | POA: Diagnosis not present

## 2020-02-01 DIAGNOSIS — I10 Essential (primary) hypertension: Secondary | ICD-10-CM | POA: Diagnosis not present

## 2020-02-01 DIAGNOSIS — R269 Unspecified abnormalities of gait and mobility: Secondary | ICD-10-CM | POA: Diagnosis not present

## 2020-02-01 DIAGNOSIS — Z87891 Personal history of nicotine dependence: Secondary | ICD-10-CM | POA: Diagnosis not present

## 2020-02-01 DIAGNOSIS — E1169 Type 2 diabetes mellitus with other specified complication: Secondary | ICD-10-CM | POA: Diagnosis not present

## 2020-02-01 DIAGNOSIS — M170 Bilateral primary osteoarthritis of knee: Secondary | ICD-10-CM | POA: Diagnosis not present

## 2020-02-03 DIAGNOSIS — M170 Bilateral primary osteoarthritis of knee: Secondary | ICD-10-CM | POA: Diagnosis not present

## 2020-02-03 DIAGNOSIS — Z87891 Personal history of nicotine dependence: Secondary | ICD-10-CM | POA: Diagnosis not present

## 2020-02-03 DIAGNOSIS — I10 Essential (primary) hypertension: Secondary | ICD-10-CM | POA: Diagnosis not present

## 2020-02-03 DIAGNOSIS — Z7982 Long term (current) use of aspirin: Secondary | ICD-10-CM | POA: Diagnosis not present

## 2020-02-03 DIAGNOSIS — S065X0D Traumatic subdural hemorrhage without loss of consciousness, subsequent encounter: Secondary | ICD-10-CM | POA: Diagnosis not present

## 2020-02-03 DIAGNOSIS — S161XXD Strain of muscle, fascia and tendon at neck level, subsequent encounter: Secondary | ICD-10-CM | POA: Diagnosis not present

## 2020-02-03 DIAGNOSIS — Z9181 History of falling: Secondary | ICD-10-CM | POA: Diagnosis not present

## 2020-02-05 DIAGNOSIS — Z7982 Long term (current) use of aspirin: Secondary | ICD-10-CM | POA: Diagnosis not present

## 2020-02-05 DIAGNOSIS — I10 Essential (primary) hypertension: Secondary | ICD-10-CM | POA: Diagnosis not present

## 2020-02-05 DIAGNOSIS — S161XXD Strain of muscle, fascia and tendon at neck level, subsequent encounter: Secondary | ICD-10-CM | POA: Diagnosis not present

## 2020-02-05 DIAGNOSIS — S065X0D Traumatic subdural hemorrhage without loss of consciousness, subsequent encounter: Secondary | ICD-10-CM | POA: Diagnosis not present

## 2020-02-05 DIAGNOSIS — Z9181 History of falling: Secondary | ICD-10-CM | POA: Diagnosis not present

## 2020-02-05 DIAGNOSIS — M170 Bilateral primary osteoarthritis of knee: Secondary | ICD-10-CM | POA: Diagnosis not present

## 2020-02-05 DIAGNOSIS — Z87891 Personal history of nicotine dependence: Secondary | ICD-10-CM | POA: Diagnosis not present

## 2020-02-08 ENCOUNTER — Ambulatory Visit: Payer: Medicare HMO | Admitting: Orthopaedic Surgery

## 2020-02-08 ENCOUNTER — Encounter: Payer: Self-pay | Admitting: Orthopaedic Surgery

## 2020-02-08 ENCOUNTER — Other Ambulatory Visit: Payer: Self-pay

## 2020-02-08 DIAGNOSIS — M170 Bilateral primary osteoarthritis of knee: Secondary | ICD-10-CM

## 2020-02-08 DIAGNOSIS — Z7982 Long term (current) use of aspirin: Secondary | ICD-10-CM | POA: Diagnosis not present

## 2020-02-08 DIAGNOSIS — S161XXD Strain of muscle, fascia and tendon at neck level, subsequent encounter: Secondary | ICD-10-CM | POA: Diagnosis not present

## 2020-02-08 DIAGNOSIS — Z9181 History of falling: Secondary | ICD-10-CM | POA: Diagnosis not present

## 2020-02-08 DIAGNOSIS — S065X0D Traumatic subdural hemorrhage without loss of consciousness, subsequent encounter: Secondary | ICD-10-CM | POA: Diagnosis not present

## 2020-02-08 DIAGNOSIS — M1712 Unilateral primary osteoarthritis, left knee: Secondary | ICD-10-CM

## 2020-02-08 DIAGNOSIS — M545 Low back pain: Secondary | ICD-10-CM

## 2020-02-08 DIAGNOSIS — M1711 Unilateral primary osteoarthritis, right knee: Secondary | ICD-10-CM

## 2020-02-08 DIAGNOSIS — G8929 Other chronic pain: Secondary | ICD-10-CM | POA: Diagnosis not present

## 2020-02-08 DIAGNOSIS — Z87891 Personal history of nicotine dependence: Secondary | ICD-10-CM | POA: Diagnosis not present

## 2020-02-08 DIAGNOSIS — I10 Essential (primary) hypertension: Secondary | ICD-10-CM | POA: Diagnosis not present

## 2020-02-08 NOTE — Progress Notes (Signed)
The patient is well-known to me.  She is a very pleasant 74 year old female with known bilateral knee osteoarthritis with the left knee worse than the right.  We have tried steroid injections over the years and more recently hyaluronic acid.  She states now that her knee really does not hurt but it does stay stiff and she limps a lot.  She does not really have any pain.  She just want to see what else she could do for her knees.  She has had some falls recently and does have home therapy coming to her house.  On examination of both knees she does have stiffness of both knees.  The left seems a worse than the right but she does not have a lot of pain with her knees.  Her previous x-rays do show tricompartment arthritis of both knees with the left worse than the right.  I do feel that she would benefit more from outpatient physical therapy they can provide more modalities that could help strengthen both of her knees and help with her balance, gait training and coordination.  She would like them to work on her lumbar spine and I think strengthening of that and helping with her posture could help her overall in terms of her fall risk and just hopefully getting her less stiff.  She agrees with this treatment plan.  We will work on setting her up for outpatient physical therapy hopefully here at [facility].  We will then see her back in about 6 weeks in follow-up.

## 2020-02-09 ENCOUNTER — Ambulatory Visit: Payer: Medicare HMO | Admitting: Orthopaedic Surgery

## 2020-02-09 ENCOUNTER — Other Ambulatory Visit: Payer: Self-pay

## 2020-02-09 DIAGNOSIS — I10 Essential (primary) hypertension: Secondary | ICD-10-CM | POA: Diagnosis not present

## 2020-02-09 DIAGNOSIS — Z9181 History of falling: Secondary | ICD-10-CM | POA: Diagnosis not present

## 2020-02-09 DIAGNOSIS — Z7982 Long term (current) use of aspirin: Secondary | ICD-10-CM | POA: Diagnosis not present

## 2020-02-09 DIAGNOSIS — S161XXD Strain of muscle, fascia and tendon at neck level, subsequent encounter: Secondary | ICD-10-CM | POA: Diagnosis not present

## 2020-02-09 DIAGNOSIS — S065X0D Traumatic subdural hemorrhage without loss of consciousness, subsequent encounter: Secondary | ICD-10-CM | POA: Diagnosis not present

## 2020-02-09 DIAGNOSIS — M1711 Unilateral primary osteoarthritis, right knee: Secondary | ICD-10-CM

## 2020-02-09 DIAGNOSIS — R2681 Unsteadiness on feet: Secondary | ICD-10-CM

## 2020-02-09 DIAGNOSIS — M1712 Unilateral primary osteoarthritis, left knee: Secondary | ICD-10-CM

## 2020-02-09 DIAGNOSIS — M170 Bilateral primary osteoarthritis of knee: Secondary | ICD-10-CM | POA: Diagnosis not present

## 2020-02-09 DIAGNOSIS — Z87891 Personal history of nicotine dependence: Secondary | ICD-10-CM | POA: Diagnosis not present

## 2020-02-10 ENCOUNTER — Telehealth: Payer: Self-pay | Admitting: Orthopaedic Surgery

## 2020-02-10 MED ORDER — METHYLPREDNISOLONE 4 MG PO TABS: ORAL_TABLET | ORAL | 0 refills | Status: DC

## 2020-02-10 NOTE — Telephone Encounter (Signed)
Please advise 

## 2020-02-10 NOTE — Telephone Encounter (Signed)
Patient called stating that Dr. Ninfa Linden was suppose to send in a RX for Prednisone.  Patient uses [facility] on [address].  CB#[phone].  Thank you.

## 2020-02-10 NOTE — Telephone Encounter (Signed)
I did send in a steroid Dosepak.

## 2020-02-10 NOTE — Telephone Encounter (Signed)
Patient aware this was called in for her  

## 2020-02-15 ENCOUNTER — Other Ambulatory Visit: Payer: Self-pay

## 2020-02-15 ENCOUNTER — Encounter: Payer: Self-pay | Admitting: Physical Therapy

## 2020-02-15 ENCOUNTER — Ambulatory Visit (INDEPENDENT_AMBULATORY_CARE_PROVIDER_SITE_OTHER): Payer: Medicare HMO | Admitting: Physical Therapy

## 2020-02-15 DIAGNOSIS — Z7982 Long term (current) use of aspirin: Secondary | ICD-10-CM | POA: Diagnosis not present

## 2020-02-15 DIAGNOSIS — S065X0D Traumatic subdural hemorrhage without loss of consciousness, subsequent encounter: Secondary | ICD-10-CM | POA: Diagnosis not present

## 2020-02-15 DIAGNOSIS — M25661 Stiffness of right knee, not elsewhere classified: Secondary | ICD-10-CM | POA: Diagnosis not present

## 2020-02-15 DIAGNOSIS — M25662 Stiffness of left knee, not elsewhere classified: Secondary | ICD-10-CM

## 2020-02-15 DIAGNOSIS — R262 Difficulty in walking, not elsewhere classified: Secondary | ICD-10-CM

## 2020-02-15 DIAGNOSIS — M25562 Pain in left knee: Secondary | ICD-10-CM

## 2020-02-15 DIAGNOSIS — R2689 Other abnormalities of gait and mobility: Secondary | ICD-10-CM | POA: Diagnosis not present

## 2020-02-15 DIAGNOSIS — G8929 Other chronic pain: Secondary | ICD-10-CM | POA: Diagnosis not present

## 2020-02-15 DIAGNOSIS — I10 Essential (primary) hypertension: Secondary | ICD-10-CM | POA: Diagnosis not present

## 2020-02-15 DIAGNOSIS — M25561 Pain in right knee: Secondary | ICD-10-CM

## 2020-02-15 DIAGNOSIS — M6281 Muscle weakness (generalized): Secondary | ICD-10-CM

## 2020-02-15 DIAGNOSIS — M170 Bilateral primary osteoarthritis of knee: Secondary | ICD-10-CM | POA: Diagnosis not present

## 2020-02-15 DIAGNOSIS — S161XXD Strain of muscle, fascia and tendon at neck level, subsequent encounter: Secondary | ICD-10-CM | POA: Diagnosis not present

## 2020-02-15 DIAGNOSIS — Z87891 Personal history of nicotine dependence: Secondary | ICD-10-CM | POA: Diagnosis not present

## 2020-02-15 DIAGNOSIS — Z9181 History of falling: Secondary | ICD-10-CM | POA: Diagnosis not present

## 2020-02-15 NOTE — Patient Instructions (Signed)
Access Code: [email or web address] URL: [email or web address] Date: 02/15/2020 Prepared by: Kearney Hard  Exercises Supine Active Straight Leg Raise - 2 x daily - 7 x weekly - 2 sets - 10 reps Supine Heel Slide - 2 x daily - 7 x weekly - 2 sets - 10 reps Long Sitting Quad Set with Towel Roll Under Heel - 2 x daily - 7 x weekly - 2 sets - 10 reps - 5 sec hold Hooklying Single Knee to Chest Stretch - 2 x daily - 7 x weekly - 2 sets - 5 reps - 10 sec hold Supine Bridge - 2 x daily - 7 x weekly - 1 sets - 10 reps - 5 sec hold

## 2020-02-15 NOTE — Therapy (Signed)
[facility] Physical Therapy [address] [place], [place], [address] Phone: [phone]   Fax:  [phone]  Physical Therapy Evaluation  Patient Details  Name: Diane Snow MRN: [id number] Date of Birth: [birth year] Referring Provider (PT): Jean Rosenthal, MD   Encounter Date: 02/15/2020  PT End of Session - 02/15/20 1701    Visit Number  1    Number of Visits  12    Date for PT Re-Evaluation  03/28/20    Authorization Type  humana    PT Start Time  1455    PT Stop Time  1535    PT Time Calculation (min)  40 min    Activity Tolerance  Patient tolerated treatment well    Behavior During Therapy  [facility] for tasks assessed/performed       Past Medical History:  Diagnosis Date  . Allergy   . Arthritis   . Hx of adenomatous colonic polyps 09/27/2014  . Hypertension     Past Surgical History:  Procedure Laterality Date  . BREAST BIOPSY Left 1976  . BREAST EXCISIONAL BIOPSY Left   . COLONOSCOPY    . PARTIAL HYSTERECTOMY      There were no vitals filed for this visit.   Subjective Assessment - 02/15/20 1458    Subjective  Pt arriving to therapy reporting no pain in bilateral knees. Pt reporting stiffness and weakness more of the left side. Pt reporting bilateral knee injection in December 2020.    Pertinent History  OA, HTN, , allergies, partial hysterectomy    Diagnostic tests  X-ray, tricompartmental arthritis    Patient Stated Goals  "to be more mobile, get around better"    Currently in Pain?  No/denies         [facility] PT Assessment - 02/15/20 0001      Assessment   Medical Diagnosis  bilateral LE stiffness/weakness    Referring Provider (PT)  Jean Rosenthal, MD    Hand Dominance  Right    Next MD Visit  follow up in 6 weeks    Prior Therapy  yes, bilateral knees      Precautions   Precautions  None      Restrictions   Weight Bearing Restrictions  No      Balance Screen   Has the patient fallen in the past 6 months   Yes    How many times?  1   hit head, CT revealed no damage   Is the patient reluctant to leave their home because of a fear of falling?   No      Prior Function   Level of Independence  Independent with basic ADLs    Leisure  go to movies, go out to eat      Cognition   Overall Cognitive Status  Within Functional Limits for tasks assessed      Posture/Postural Control   Posture/Postural Control  Postural limitations    Postural Limitations  Rounded Shoulders;Forward head;Decreased lumbar lordosis;Increased thoracic kyphosis      ROM / Strength   AROM / PROM / Strength  AROM;PROM;Strength      AROM   AROM Assessment Site  Knee    Right/Left Knee  Right;Left    Right Knee Extension  10    Right Knee Flexion  112    Left Knee Extension  10    Left Knee Flexion  102      PROM   PROM Assessment Site  Knee    Right/Left Knee  Right;Left    Right Knee Extension  5    Right Knee Flexion  125    Left Knee Extension  5    Left Knee Flexion  110      Strength   Strength Assessment Site  Hip;Knee    Right/Left Hip  Right;Left    Right Hip Flexion  4-/5    Right Hip ABduction  3+/5    Right Hip ADduction  3+/5    Left Hip Flexion  3+/5    Left Hip ABduction  3/5    Left Hip ADduction  3/5    Right/Left Knee  Right;Left    Right Knee Flexion  4-/5    Right Knee Extension  4-/5    Left Knee Flexion  3/5    Left Knee Extension  3/5      Palpation   Palpation comment  TTP along joint line of L knee      Transfers   Five time sit to stand comments   24 seconds, using bilateral UE support      Ambulation/Gait   Assistive device  Straight cane    Gait Pattern  Step-through pattern;Decreased step length - right;Decreased step length - left;Decreased stance time - left;Decreased stride length;Decreased dorsiflexion - right;Decreased dorsiflexion - left;Poor foot clearance - left;Poor foot clearance - right                Objective measurements completed on  examination: See above findings.              PT Education - 02/15/20 1701    Education Details  PT POC, HEP, heel to toe gait    Person(s) Educated  Patient    Methods  Explanation;Demonstration;Handout    Comprehension  Returned demonstration;Verbalized understanding;Need further instruction       PT Short Term Goals - 02/15/20 1651      PT SHORT TERM GOAL #1   Title  perform BERG balance and create LTG    Time  2    Period  Weeks    Status  New        PT Long Term Goals - 02/15/20 1651      PT LONG TERM GOAL #1   Title  Pt will be independent in her HEP and progression.    Baseline  initial HEP issued 02/15/2020    Time  6    Period  Weeks    Status  New    Target Date  03/28/20      PT LONG TERM GOAL #2   Title  Pt will improve bilateral knee strength to >/= 4/5 in order to improve functional mobility and gait.    Baseline  see flow sheets    Time  6    Period  Weeks    Status  New    Target Date  03/28/20      PT LONG TERM GOAL #3   Title  Pt will be able to amb for > 15 minutes with LRAD with step through gait pattern.    Baseline  Pt reporting walking only short distances at home before requiring a rest break.    Time  6    Period  Weeks    Status  New      PT LONG TERM GOAL #4   Title  Pt will improve L knee flexion to >/= 115 degrees, in order to improve gait and functional mobility.    Baseline  AROM L knee:  10-102 degrees    Time  6    Period  Weeks    Status  New    Target Date  03/28/20      PT LONG TERM GOAL #5   Title  Pt will improve 5 times sit to stand to </= 15 seconds with/without UE support.    Baseline  24 seconds bilateral UE support from chair.    Time  6    Period  Weeks    Status  New    Target Date  03/28/20             Plan - 02/15/20 1703    Clinical Impression Statement  Pt presenting today for evaluation of bilateral knee pain and gait instability. Pt with history of falls, weakness in bialteral LE',  abnormal gait, and decreased AROM in bilateral knees. AROM arcs: L knee: 10-102 degrees, R knee: 10-112 degrees. Pt could benefit from skilled PT progressing toward a more independent mobility and improved safety.    Personal Factors and Comorbidities  Comorbidity 3+    Comorbidities  HTN, OA, allergeries, L ankle surgery    Examination-Activity Limitations  Squat;Stairs;Stand;Transfers    Examination-Participation Restrictions  Community Activity;Other;Shop;Church    Stability/Clinical Decision Making  Evolving/Moderate complexity    Clinical Decision Making  Moderate    Rehab Potential  Fair    PT Frequency  2x / week    PT Duration  6 weeks    PT Treatment/Interventions  ADLs/Self Care Home Management;Cryotherapy;Electrical Stimulation;Iontophoresis 4mg /ml Dexamethasone;Moist Heat;Ultrasound;Functional mobility training;Stair training;Gait training;Therapeutic activities;Therapeutic exercise;Balance training;Neuromuscular re-education;Patient/family education;Manual techniques;Taping;Dry needling;Passive range of motion;Energy conservation    PT Next Visit Plan  BERG test, LE strenghtening, Hamstring strteches, gait training, Nustep    PT Home Exercise Plan  see pt instructions    Consulted and Agree with Plan of Care  Patient       Patient will benefit from skilled therapeutic intervention in order to improve the following deficits and impairments:  Pain, Postural dysfunction, Impaired flexibility, Decreased strength, Decreased activity tolerance, Decreased range of motion, Difficulty walking, Abnormal gait, Decreased balance  Visit Diagnosis: Stiffness of right knee, not elsewhere classified  Stiffness of left knee, not elsewhere classified  Muscle weakness (generalized)  Chronic pain of left knee  Chronic pain of right knee  Difficulty in walking, not elsewhere classified  Other abnormalities of gait and mobility     Problem List Patient Active Problem List   Diagnosis  Date Noted  . Chronic pain of right knee 06/15/2019  . Unilateral primary osteoarthritis, right knee 06/15/2019  . Chronic pain of left knee 05/12/2018  . Unilateral primary osteoarthritis, left knee 05/12/2018  . Essential hypertension 11/16/2015  . Hx of adenomatous colonic polyps 09/27/2014    Oretha Caprice, MPT 02/15/2020, 5:09 PM  [facility] Physical Therapy [address] [place], [place], [address] Phone: [phone]   Fax:  [phone]  Name: Dashawn Karpinski MRN: [id number] Date of Birth: 1946-08-15

## 2020-02-17 DIAGNOSIS — Z9181 History of falling: Secondary | ICD-10-CM | POA: Diagnosis not present

## 2020-02-17 DIAGNOSIS — I10 Essential (primary) hypertension: Secondary | ICD-10-CM | POA: Diagnosis not present

## 2020-02-17 DIAGNOSIS — M170 Bilateral primary osteoarthritis of knee: Secondary | ICD-10-CM | POA: Diagnosis not present

## 2020-02-17 DIAGNOSIS — S161XXD Strain of muscle, fascia and tendon at neck level, subsequent encounter: Secondary | ICD-10-CM | POA: Diagnosis not present

## 2020-02-17 DIAGNOSIS — S065X0D Traumatic subdural hemorrhage without loss of consciousness, subsequent encounter: Secondary | ICD-10-CM | POA: Diagnosis not present

## 2020-02-17 DIAGNOSIS — Z87891 Personal history of nicotine dependence: Secondary | ICD-10-CM | POA: Diagnosis not present

## 2020-02-17 DIAGNOSIS — Z7982 Long term (current) use of aspirin: Secondary | ICD-10-CM | POA: Diagnosis not present

## 2020-02-18 ENCOUNTER — Ambulatory Visit: Payer: Medicare HMO | Admitting: Physical Therapy

## 2020-02-18 ENCOUNTER — Other Ambulatory Visit: Payer: Self-pay

## 2020-02-18 ENCOUNTER — Encounter: Payer: Self-pay | Admitting: Physical Therapy

## 2020-02-18 DIAGNOSIS — R262 Difficulty in walking, not elsewhere classified: Secondary | ICD-10-CM | POA: Diagnosis not present

## 2020-02-18 DIAGNOSIS — M25562 Pain in left knee: Secondary | ICD-10-CM

## 2020-02-18 DIAGNOSIS — M25662 Stiffness of left knee, not elsewhere classified: Secondary | ICD-10-CM

## 2020-02-18 DIAGNOSIS — R2689 Other abnormalities of gait and mobility: Secondary | ICD-10-CM | POA: Diagnosis not present

## 2020-02-18 DIAGNOSIS — M6281 Muscle weakness (generalized): Secondary | ICD-10-CM

## 2020-02-18 DIAGNOSIS — G8929 Other chronic pain: Secondary | ICD-10-CM | POA: Diagnosis not present

## 2020-02-18 DIAGNOSIS — M25661 Stiffness of right knee, not elsewhere classified: Secondary | ICD-10-CM

## 2020-02-18 DIAGNOSIS — M25561 Pain in right knee: Secondary | ICD-10-CM | POA: Diagnosis not present

## 2020-02-18 NOTE — Therapy (Signed)
[facility] Physical Therapy [address] [place], [place], [address] Phone: [phone]   Fax:  [phone]  Physical Therapy Treatment  Patient Details  Name: Diane Snow MRN: [id number] Date of Birth: [birth year] Referring Provider (PT): Jean Rosenthal, MD   Encounter Date: 02/18/2020  PT End of Session - 02/18/20 1521    Visit Number  2    Number of Visits  12    Date for PT Re-Evaluation  03/28/20    Authorization Type  humana    PT Start Time  1445    PT Stop Time  1528    PT Time Calculation (min)  43 min    Activity Tolerance  Patient tolerated treatment well    Behavior During Therapy  [facility] for tasks assessed/performed       Past Medical History:  Diagnosis Date  . Allergy   . Arthritis   . Hx of adenomatous colonic polyps 09/27/2014  . Hypertension     Past Surgical History:  Procedure Laterality Date  . BREAST BIOPSY Left 1976  . BREAST EXCISIONAL BIOPSY Left   . COLONOSCOPY    . PARTIAL HYSTERECTOMY      There were no vitals filed for this visit.  Subjective Assessment - 02/18/20 1520    Subjective  pt arriving to therapy reporting no pain only balance difficulties and weakness in her legs.    Pertinent History  OA, HTN, , allergies, partial hysterectomy    Diagnostic tests  X-ray, tricompartmental arthritis    Patient Stated Goals  "to be more mobile, get around better"    Currently in Pain?  No/denies                       [facility] Adult PT Treatment/Exercise - 02/18/20 0001      Exercises   Exercises  Knee/Hip      Knee/Hip Exercises: Stretches   Active Hamstring Stretch  Right;Left;1 rep;30 seconds      Knee/Hip Exercises: Aerobic   Nustep  L2 x 6 minutes      Knee/Hip Exercises: Standing   Heel Raises  10 reps    Other Standing Knee Exercises  sit to stand using UE support x 5 reps from chair with arm rest      Knee/Hip Exercises: Supine   Quad Sets  AROM;Strengthening;Both;10  reps;Limitations    Quad Sets Limitations  holding 5 seconds    Heel Slides  AROM;Both;10 reps    Bridges  AROM;Strengthening;Both;2 sets;10 reps    Bridges Limitations  limited hold    Straight Leg Raises  Strengthening;Both;2 sets;5 reps    Other Supine Knee/Hip Exercises  clams x 10 reps               PT Short Term Goals - 02/15/20 1651      PT SHORT TERM GOAL #1   Title  perform BERG balance and create LTG    Time  2    Period  Weeks    Status  New        PT Long Term Goals - 02/15/20 1651      PT LONG TERM GOAL #1   Title  Pt will be independent in her HEP and progression.    Baseline  initial HEP issued 02/15/2020    Time  6    Period  Weeks    Status  New    Target Date  03/28/20      PT LONG TERM GOAL #  2   Title  Pt will improve bilateral knee strength to >/= 4/5 in order to improve functional mobility and gait.    Baseline  see flow sheets    Time  6    Period  Weeks    Status  New    Target Date  03/28/20      PT LONG TERM GOAL #3   Title  Pt will be able to amb for > 15 minutes with LRAD with step through gait pattern.    Baseline  Pt reporting walking only short distances at home before requiring a rest break.    Time  6    Period  Weeks    Status  New      PT LONG TERM GOAL #4   Title  Pt will improve L knee flexion to >/= 115 degrees, in order to improve gait and functional mobility.    Baseline  AROM L knee: 10-102 degrees    Time  6    Period  Weeks    Status  New    Target Date  03/28/20      PT LONG TERM GOAL #5   Title  Pt will improve 5 times sit to stand to </= 15 seconds with/without UE support.    Baseline  24 seconds bilateral UE support from chair.    Time  6    Period  Weeks    Status  New    Target Date  03/28/20            Plan - 02/18/20 1523    Clinical Impression Statement  Diane Snow arriving to therapy today reporting no pain at rest. Pt reporting balance concerns and weakness. Treatment session concentrated on  general LE strengthening and ROM. Continue skilled PT, Perfrom BERG test at next visit.    Personal Factors and Comorbidities  Comorbidity 3+    Comorbidities  HTN, OA, allergeries, L ankle surgery    Examination-Activity Limitations  Squat;Stairs;Stand;Transfers    Examination-Participation Restrictions  Community Activity;Other;Shop;Church    Stability/Clinical Decision Making  Evolving/Moderate complexity    Rehab Potential  Fair    PT Frequency  2x / week    PT Duration  6 weeks    PT Treatment/Interventions  ADLs/Self Care Home Management;Cryotherapy;Electrical Stimulation;Iontophoresis 4mg /ml Dexamethasone;Moist Heat;Ultrasound;Functional mobility training;Stair training;Gait training;Therapeutic activities;Therapeutic exercise;Balance training;Neuromuscular re-education;Patient/family education;Manual techniques;Taping;Dry needling;Passive range of motion;Energy conservation    PT Next Visit Plan  BERG test, LE strenghtening, Hamstring strteches, gait training, Nustep    PT Home Exercise Plan  see pt instructions    Consulted and Agree with Plan of Care  Patient       Patient will benefit from skilled therapeutic intervention in order to improve the following deficits and impairments:  Pain, Postural dysfunction, Impaired flexibility, Decreased strength, Decreased activity tolerance, Decreased range of motion, Difficulty walking, Abnormal gait, Decreased balance  Visit Diagnosis: Stiffness of right knee, not elsewhere classified  Stiffness of left knee, not elsewhere classified  Muscle weakness (generalized)  Chronic pain of left knee  Chronic pain of right knee  Difficulty in walking, not elsewhere classified  Other abnormalities of gait and mobility     Problem List Patient Active Problem List   Diagnosis Date Noted  . Chronic pain of right knee 06/15/2019  . Unilateral primary osteoarthritis, right knee 06/15/2019  . Chronic pain of left knee 05/12/2018  .  Unilateral primary osteoarthritis, left knee 05/12/2018  . Essential hypertension 11/16/2015  . Hx of adenomatous colonic  polyps 09/27/2014    Oretha Caprice, MPT 02/18/2020, 3:28 PM  [facility] Physical Therapy [address] [place], [place], [address] Phone: [phone]   Fax:  [phone]  Name: Diane Snow MRN: [id number] Date of Birth: 1946-05-01

## 2020-02-23 ENCOUNTER — Encounter: Payer: Self-pay | Admitting: Physical Therapy

## 2020-02-23 ENCOUNTER — Ambulatory Visit: Payer: Medicare HMO | Admitting: Physical Therapy

## 2020-02-23 ENCOUNTER — Other Ambulatory Visit: Payer: Self-pay

## 2020-02-23 DIAGNOSIS — M25562 Pain in left knee: Secondary | ICD-10-CM

## 2020-02-23 DIAGNOSIS — G8929 Other chronic pain: Secondary | ICD-10-CM

## 2020-02-23 DIAGNOSIS — M25561 Pain in right knee: Secondary | ICD-10-CM

## 2020-02-23 DIAGNOSIS — R2689 Other abnormalities of gait and mobility: Secondary | ICD-10-CM | POA: Diagnosis not present

## 2020-02-23 DIAGNOSIS — M6281 Muscle weakness (generalized): Secondary | ICD-10-CM | POA: Diagnosis not present

## 2020-02-23 DIAGNOSIS — M25662 Stiffness of left knee, not elsewhere classified: Secondary | ICD-10-CM | POA: Diagnosis not present

## 2020-02-23 DIAGNOSIS — M25661 Stiffness of right knee, not elsewhere classified: Secondary | ICD-10-CM | POA: Diagnosis not present

## 2020-02-23 DIAGNOSIS — R262 Difficulty in walking, not elsewhere classified: Secondary | ICD-10-CM | POA: Diagnosis not present

## 2020-02-23 NOTE — Therapy (Signed)
[facility] Physical Therapy [address] [place], [place], [address] Phone: [phone]   Fax:  [phone]  Physical Therapy Treatment  Patient Details  Name: Diane Snow MRN: [id number] Date of Birth: [birth year] Referring Provider (PT): Jean Rosenthal, MD   Encounter Date: 02/23/2020  PT End of Session - 02/23/20 1146    Visit Number  3    Number of Visits  12    Date for PT Re-Evaluation  03/28/20    Authorization Type  humana    PT Start Time  1106    PT Stop Time  1148    PT Time Calculation (min)  42 min    Activity Tolerance  Patient tolerated treatment well    Behavior During Therapy  [facility] for tasks assessed/performed       Past Medical History:  Diagnosis Date  . Allergy   . Arthritis   . Hx of adenomatous colonic polyps 09/27/2014  . Hypertension     Past Surgical History:  Procedure Laterality Date  . BREAST BIOPSY Left 1976  . BREAST EXCISIONAL BIOPSY Left   . COLONOSCOPY    . PARTIAL HYSTERECTOMY      There were no vitals filed for this visit.  Subjective Assessment - 02/23/20 1112    Subjective  Pt arriving to therapy reporting no pain. Pt reporting some LOB at home since her last visit.    Pertinent History  OA, HTN, , allergies, partial hysterectomy    Diagnostic tests  X-ray, tricompartmental arthritis    Patient Stated Goals  "to be more mobile, get around better"    Currently in Pain?  No/denies         [facility] PT Assessment - 02/23/20 0001      Standardized Balance Assessment   Standardized Balance Assessment  Berg Balance Test      Berg Balance Test   Sit to Stand  Able to stand  independently using hands    Standing Unsupported  Able to stand safely 2 minutes    Sitting with Back Unsupported but Feet Supported on Floor or Stool  Able to sit safely and securely 2 minutes    Stand to Sit  Uses backs of legs against chair to control descent    Transfers  Able to transfer safely, definite need of hands    Standing Unsupported with Eyes Closed  Able to stand 10 seconds with supervision    Standing Unsupported with Feet Together  Able to place feet together independently and stand for 1 minute with supervision    From Standing, Reach Forward with Outstretched Arm  Can reach forward >12 cm safely (5")    From Standing Position, Pick up Object from Floor  Able to pick up shoe, needs supervision    From Standing Position, Turn to Look Behind Over each Shoulder  Turn sideways only but maintains balance    Turn 360 Degrees  Able to turn 360 degrees safely but slowly    Standing Unsupported, Alternately Place Feet on Step/Stool  Needs assistance to keep from falling or unable to try   44 seconds to L,  48 seconds to R.    Standing Unsupported, One Foot in Front  Able to take small step independently and hold 30 seconds    Standing on One Leg  Tries to lift leg/unable to hold 3 seconds but remains standing independently    Total Score  35  [facility] Adult PT Treatment/Exercise - 02/23/20 0001      Knee/Hip Exercises: Stretches   Active Hamstring Stretch  Right;Left;1 rep;30 seconds      Knee/Hip Exercises: Standing   Heel Raises  10 reps    Hip Flexion Limitations  marching with bilateral UE support    Other Standing Knee Exercises  sit to stand using UE support x 5 reps from chair with arm rest      Knee/Hip Exercises: Seated   Long Arc Quad  Strengthening;Both;15 reps    Heel Slides  AROM;Both    Ball Squeeze  x 15 holding 3 seconds each    Hamstring Curl  Strengthening;Both;Limitations    Hamstring Limitations  yellow theraband     Sit to Sand  2 sets;5 reps;with UE support      Knee/Hip Exercises: Supine   Bridges  AROM;Strengthening;Both;2 sets;10 reps    Straight Leg Raises  Strengthening;Both;2 sets;5 reps             PT Education - 02/23/20 1113    Education Details  BERG balance test and results    Person(s) Educated  Patient    Methods   Explanation;Demonstration    Comprehension  Verbalized understanding;Returned demonstration       PT Short Term Goals - 02/23/20 1139      PT SHORT TERM GOAL #1   Title  perform BERG balance and create LTG    Time  2    Period  Weeks    Status  Achieved        PT Long Term Goals - 02/23/20 1140      PT LONG TERM GOAL #1   Title  Pt will be independent in her HEP and progression.    Baseline  initial HEP issued 02/15/2020    Status  On-going      PT LONG TERM GOAL #2   Title  Pt will improve bilateral knee strength to >/= 4/5 in order to improve functional mobility and gait.    Baseline  see flow sheets    Period  Weeks    Status  On-going      PT LONG TERM GOAL #3   Title  Pt will be able to amb for > 15 minutes with LRAD with step through gait pattern.    Baseline  Pt reporting walking only short distances at home before requiring a rest break.    Status  On-going      PT LONG TERM GOAL #4   Title  Pt will improve L knee flexion to >/= 115 degrees, in order to improve gait and functional mobility.    Baseline  AROM L knee: 10-102 degrees    Time  6    Period  Weeks    Status  On-going      PT LONG TERM GOAL #5   Title  Pt will improve 5 times sit to stand to </= 15 seconds with/without UE support.    Baseline  24 seconds bilateral UE support from chair.    Status  On-going      Additional Long Term Goals   Additional Long Term Goals  Yes      PT LONG TERM GOAL #6   Title  Pt will improve her BERG score from 35/56 to >/= 42/56.    Time  6    Period  Weeks    Status  New            Plan -  02/23/20 1134    Clinical Impression Statement  Sumana arriving today reporting no pain. BERG balance test performed with a score of 35/56. Pt was instructed to always use her assistive device when amb for safety. Pt reports she has a walker but it's too heavy for her to lift in and out of the car. continue to progress pt's Balance, strength and increased functional  mobilty. Recommending pt receive a neurology consult for gait abnormalities and increased time for movement.    Personal Factors and Comorbidities  Comorbidity 3+    Comorbidities  HTN, OA, allergeries, L ankle surgery    Examination-Activity Limitations  Squat;Stairs;Stand;Transfers    Examination-Participation Restrictions  Community Activity;Other;Shop;Church    Stability/Clinical Decision Making  Evolving/Moderate complexity    Rehab Potential  Fair    PT Frequency  2x / week    PT Duration  6 weeks    PT Treatment/Interventions  ADLs/Self Care Home Management;Cryotherapy;Electrical Stimulation;Iontophoresis 4mg /ml Dexamethasone;Moist Heat;Ultrasound;Functional mobility training;Stair training;Gait training;Therapeutic activities;Therapeutic exercise;Balance training;Neuromuscular re-education;Patient/family education;Manual techniques;Taping;Dry needling;Passive range of motion;Energy conservation    PT Next Visit Plan  balance, LE strenghtening, Hamstring strteches, gait training, Nustep    PT Home Exercise Plan  see pt instructions    Consulted and Agree with Plan of Care  Patient       Patient will benefit from skilled therapeutic intervention in order to improve the following deficits and impairments:  Pain, Postural dysfunction, Impaired flexibility, Decreased strength, Decreased activity tolerance, Decreased range of motion, Difficulty walking, Abnormal gait, Decreased balance  Visit Diagnosis: Stiffness of right knee, not elsewhere classified  Stiffness of left knee, not elsewhere classified  Muscle weakness (generalized)  Chronic pain of left knee  Chronic pain of right knee  Difficulty in walking, not elsewhere classified  Other abnormalities of gait and mobility     Problem List Patient Active Problem List   Diagnosis Date Noted  . Chronic pain of right knee 06/15/2019  . Unilateral primary osteoarthritis, right knee 06/15/2019  . Chronic pain of left knee  05/12/2018  . Unilateral primary osteoarthritis, left knee 05/12/2018  . Essential hypertension 11/16/2015  . Hx of adenomatous colonic polyps 09/27/2014    Oretha Caprice, PT 02/23/2020, 12:31 PM  [facility] Physical Therapy [address] [place], [place], [address] Phone: [phone]   Fax:  [phone]  Name: Diane Snow MRN: [id number] Date of Birth: 1946-03-24

## 2020-02-25 ENCOUNTER — Ambulatory Visit: Payer: Medicare HMO | Admitting: Rehabilitative and Restorative Service Providers"

## 2020-02-25 ENCOUNTER — Encounter: Payer: Self-pay | Admitting: Rehabilitative and Restorative Service Providers"

## 2020-02-25 ENCOUNTER — Other Ambulatory Visit: Payer: Self-pay

## 2020-02-25 DIAGNOSIS — M25562 Pain in left knee: Secondary | ICD-10-CM

## 2020-02-25 DIAGNOSIS — M25662 Stiffness of left knee, not elsewhere classified: Secondary | ICD-10-CM

## 2020-02-25 DIAGNOSIS — Z87891 Personal history of nicotine dependence: Secondary | ICD-10-CM | POA: Diagnosis not present

## 2020-02-25 DIAGNOSIS — M25661 Stiffness of right knee, not elsewhere classified: Secondary | ICD-10-CM | POA: Diagnosis not present

## 2020-02-25 DIAGNOSIS — G8929 Other chronic pain: Secondary | ICD-10-CM

## 2020-02-25 DIAGNOSIS — R2689 Other abnormalities of gait and mobility: Secondary | ICD-10-CM

## 2020-02-25 DIAGNOSIS — S065X0D Traumatic subdural hemorrhage without loss of consciousness, subsequent encounter: Secondary | ICD-10-CM | POA: Diagnosis not present

## 2020-02-25 DIAGNOSIS — M6281 Muscle weakness (generalized): Secondary | ICD-10-CM | POA: Diagnosis not present

## 2020-02-25 DIAGNOSIS — Z7982 Long term (current) use of aspirin: Secondary | ICD-10-CM | POA: Diagnosis not present

## 2020-02-25 DIAGNOSIS — R262 Difficulty in walking, not elsewhere classified: Secondary | ICD-10-CM

## 2020-02-25 DIAGNOSIS — Z9181 History of falling: Secondary | ICD-10-CM | POA: Diagnosis not present

## 2020-02-25 DIAGNOSIS — M25561 Pain in right knee: Secondary | ICD-10-CM

## 2020-02-25 DIAGNOSIS — S161XXD Strain of muscle, fascia and tendon at neck level, subsequent encounter: Secondary | ICD-10-CM | POA: Diagnosis not present

## 2020-02-25 DIAGNOSIS — I10 Essential (primary) hypertension: Secondary | ICD-10-CM | POA: Diagnosis not present

## 2020-02-25 DIAGNOSIS — M170 Bilateral primary osteoarthritis of knee: Secondary | ICD-10-CM | POA: Diagnosis not present

## 2020-02-25 NOTE — Therapy (Signed)
[facility] Physical Therapy [address] [place], [place], [address] Phone: [phone]   Fax:  [phone]  Physical Therapy Treatment  Patient Details  Name: Diane Snow MRN: [id number] Date of Birth: 1946/10/17 Referring Provider (PT): Jean Rosenthal, MD   Encounter Date: 02/25/2020  PT End of Session - 02/25/20 1614    Visit Number  4    Number of Visits  12    Date for PT Re-Evaluation  03/28/20    Authorization Type  humana    PT Start Time  1400    PT Stop Time  1445    PT Time Calculation (min)  45 min    Activity Tolerance  Patient tolerated treatment well    Behavior During Therapy  [facility] for tasks assessed/performed       Past Medical History:  Diagnosis Date  . Allergy   . Arthritis   . Hx of adenomatous colonic polyps 09/27/2014  . Hypertension     Past Surgical History:  Procedure Laterality Date  . BREAST BIOPSY Left 1976  . BREAST EXCISIONAL BIOPSY Left   . COLONOSCOPY    . PARTIAL HYSTERECTOMY      There were no vitals filed for this visit.  Subjective Assessment - 02/25/20 1608    Subjective  Griselle reports her knee feels pretty good today.  She is tired and notes her legs are weak, limiting her afternoon function.    Pertinent History  OA, HTN, , allergies, partial hysterectomy    Diagnostic tests  X-ray, tricompartmental arthritis    Patient Stated Goals  "to be more mobile, get around better"    Currently in Pain?  No/denies                       [facility] Adult PT Treatment/Exercise - 02/25/20 0001      High Level Balance   High Level Balance Activities  --   Heel to toe balance 4X 20 seconds (4X each foot in front)     Exercises   Exercises  Knee/Hip      Knee/Hip Exercises: Aerobic   Stationary Bike  8 minutes      Knee/Hip Exercises: Machines for Strengthening   Total Gym Leg Press  1 set of 10 50#; 2 sets of 10 75#      Knee/Hip Exercises: Seated   Long Arc Quad  Strengthening;2  sets;5 reps   Seated straight leg raises in chair with 4 pillows behind               PT Short Term Goals - 02/23/20 1139      PT SHORT TERM GOAL #1   Title  perform BERG balance and create LTG    Time  2    Period  Weeks    Status  Achieved        PT Long Term Goals - 02/23/20 1140      PT LONG TERM GOAL #1   Title  Pt will be independent in her HEP and progression.    Baseline  initial HEP issued 02/15/2020    Status  On-going      PT LONG TERM GOAL #2   Title  Pt will improve bilateral knee strength to >/= 4/5 in order to improve functional mobility and gait.    Baseline  see flow sheets    Period  Weeks    Status  On-going      PT LONG TERM GOAL #3  Title  Pt will be able to amb for > 15 minutes with LRAD with step through gait pattern.    Baseline  Pt reporting walking only short distances at home before requiring a rest break.    Status  On-going      PT LONG TERM GOAL #4   Title  Pt will improve L knee flexion to >/= 115 degrees, in order to improve gait and functional mobility.    Baseline  AROM L knee: 10-102 degrees    Time  6    Period  Weeks    Status  On-going      PT LONG TERM GOAL #5   Title  Pt will improve 5 times sit to stand to </= 15 seconds with/without UE support.    Baseline  24 seconds bilateral UE support from chair.    Status  On-going      Additional Long Term Goals   Additional Long Term Goals  Yes      PT LONG TERM GOAL #6   Title  Pt will improve her BERG score from 35/56 to >/= 42/56.    Time  6    Period  Weeks    Status  New            Plan - 02/25/20 1614    Clinical Impression Statement  Cloa notes progress with her pain although she notes her legs are weak and she can't get as much done later in the day due to fatigue/weakness.  She will benefit from continued strength, balance and endurance work to maximize her independent function.    Personal Factors and Comorbidities  Comorbidity 3+    Comorbidities   HTN, OA, allergeries, L ankle surgery    Examination-Activity Limitations  Squat;Stairs;Stand;Transfers    Examination-Participation Restrictions  Community Activity;Other;Shop;Church    Stability/Clinical Decision Making  Evolving/Moderate complexity    Rehab Potential  Fair    PT Frequency  2x / week    PT Duration  6 weeks    PT Treatment/Interventions  ADLs/Self Care Home Management;Cryotherapy;Electrical Stimulation;Iontophoresis 4mg /ml Dexamethasone;Moist Heat;Ultrasound;Functional mobility training;Stair training;Gait training;Therapeutic activities;Therapeutic exercise;Balance training;Neuromuscular re-education;Patient/family education;Manual techniques;Taping;Dry needling;Passive range of motion;Energy conservation    PT Next Visit Plan  Continue strength and endurance work.  Add more balance activities as time allows.    PT Home Exercise Plan  see pt instructions    Consulted and Agree with Plan of Care  Patient       Patient will benefit from skilled therapeutic intervention in order to improve the following deficits and impairments:  Pain, Postural dysfunction, Impaired flexibility, Decreased strength, Decreased activity tolerance, Decreased range of motion, Difficulty walking, Abnormal gait, Decreased balance  Visit Diagnosis: Stiffness of right knee, not elsewhere classified  Stiffness of left knee, not elsewhere classified  Muscle weakness (generalized)  Chronic pain of left knee  Chronic pain of right knee  Difficulty in walking, not elsewhere classified  Other abnormalities of gait and mobility     Problem List Patient Active Problem List   Diagnosis Date Noted  . Chronic pain of right knee 06/15/2019  . Unilateral primary osteoarthritis, right knee 06/15/2019  . Chronic pain of left knee 05/12/2018  . Unilateral primary osteoarthritis, left knee 05/12/2018  . Essential hypertension 11/16/2015  . Hx of adenomatous colonic polyps 09/27/2014    Farley Ly PT, MPT 02/25/2020, 4:18 PM  [facility] Physical Therapy [address] [place], [place], [address] Phone: [phone]   Fax:  [phone]  Name: Anyeli Fietz MRN: [id number] Date of Birth: 10-26-1946

## 2020-02-25 NOTE — Patient Instructions (Signed)
Encouraged Shonna to do straight leg raises in a chair to emphasize quadriceps vs hip flexors supine.  Encouraged HT balance at home in a doorway 1X/day

## 2020-03-01 ENCOUNTER — Other Ambulatory Visit: Payer: Self-pay

## 2020-03-01 ENCOUNTER — Encounter: Payer: Self-pay | Admitting: Rehabilitative and Restorative Service Providers"

## 2020-03-01 ENCOUNTER — Ambulatory Visit: Payer: Medicare HMO | Admitting: Rehabilitative and Restorative Service Providers"

## 2020-03-01 DIAGNOSIS — R262 Difficulty in walking, not elsewhere classified: Secondary | ICD-10-CM | POA: Diagnosis not present

## 2020-03-01 DIAGNOSIS — M25561 Pain in right knee: Secondary | ICD-10-CM

## 2020-03-01 DIAGNOSIS — M25562 Pain in left knee: Secondary | ICD-10-CM

## 2020-03-01 DIAGNOSIS — M25662 Stiffness of left knee, not elsewhere classified: Secondary | ICD-10-CM

## 2020-03-01 DIAGNOSIS — M6281 Muscle weakness (generalized): Secondary | ICD-10-CM

## 2020-03-01 DIAGNOSIS — R2689 Other abnormalities of gait and mobility: Secondary | ICD-10-CM | POA: Diagnosis not present

## 2020-03-01 DIAGNOSIS — M1711 Unilateral primary osteoarthritis, right knee: Secondary | ICD-10-CM

## 2020-03-01 DIAGNOSIS — M25661 Stiffness of right knee, not elsewhere classified: Secondary | ICD-10-CM | POA: Diagnosis not present

## 2020-03-01 DIAGNOSIS — M1712 Unilateral primary osteoarthritis, left knee: Secondary | ICD-10-CM | POA: Diagnosis not present

## 2020-03-01 DIAGNOSIS — G8929 Other chronic pain: Secondary | ICD-10-CM

## 2020-03-01 NOTE — Patient Instructions (Signed)
Seated straight leg raises and heel to toe balance at home.

## 2020-03-01 NOTE — Therapy (Signed)
[facility] [address] [place], [place], [address] Phone: [phone]   Fax:  [phone]  Physical Therapy Treatment  Patient Details  Name: Diane Snow MRN: [id number] Date of Birth: 03-04-1946 Referring Provider (PT): Jean Rosenthal, MD   Encounter Date: 03/01/2020  PT End of Session - 03/01/20 1144    Visit Number  5    Number of Visits  12    Date for PT Re-Evaluation  03/28/20    Authorization Type  humana    PT Start Time  1100    PT Stop Time  1144    PT Time Calculation (min)  44 min    Equipment Utilized During Treatment  Gait belt    Activity Tolerance  Patient tolerated treatment well    Behavior During Therapy  [facility] for tasks assessed/performed       Past Medical History:  Diagnosis Date  . Allergy   . Arthritis   . Hx of adenomatous colonic polyps 09/27/2014  . Hypertension     Past Surgical History:  Procedure Laterality Date  . BREAST BIOPSY Left 1976  . BREAST EXCISIONAL BIOPSY Left   . COLONOSCOPY    . PARTIAL HYSTERECTOMY      There were no vitals filed for this visit.  Subjective Assessment - 03/01/20 1101    Subjective  Pammy notes no soreness from her last visit.  She has been completing her HEP as prescribed.    Pertinent History  OA, HTN, , allergies, partial hysterectomy    How long can you sit comfortably?  30-60 minutes    How long can you stand comfortably?  10 minutes    How long can you walk comfortably?  10 minutes    Diagnostic tests  X-ray, tricompartmental arthritis    Patient Stated Goals  "to be more mobile, get around better"    Currently in Pain?  Yes    Pain Score  3     Pain Location  Knee    Pain Orientation  Left    Pain Descriptors / Indicators  Aching    Pain Type  Chronic pain    Pain Onset  More than a month ago    Pain Frequency  Constant    Aggravating Factors   Rain, overuse.    Pain Relieving Factors  Moving is better than prolonged sitting or lying down.    Effect of Pain on Daily Activities  Slows down ADLs (cooking, cleaning)                       [facility] Adult PT Treatment/Exercise - 03/01/20 0001      High Level Balance   High Level Balance Activities  --   Heel to toe balance 4X 20 seconds (4X each foot in front)     Exercises   Exercises  Knee/Hip      Knee/Hip Exercises: Aerobic   Stationary Bike  Seat 4 8 minutes      Knee/Hip Exercises: Machines for Strengthening   Total Gym Leg Press  3 sets of 10 75#      Knee/Hip Exercises: Seated   Long Arc Quad  Strengthening;2 sets;5 reps   Seated straight leg raises in chair with 4 pillows behind             PT Education - 03/01/20 1142    Education Details  Again reviewed seated straight leg raises and balance in a doorway for Reinette's HEP.  Person(s) Educated  Patient    Methods  Explanation;Demonstration;Verbal cues    Comprehension  Verbalized understanding;Returned demonstration;Need further instruction;Verbal cues required       PT Short Term Goals - 02/23/20 1139      PT SHORT TERM GOAL #1   Title  perform BERG balance and create LTG    Time  2    Period  Weeks    Status  Achieved        PT Long Term Goals - 02/23/20 1140      PT LONG TERM GOAL #1   Title  Pt will be independent in her HEP and progression.    Baseline  initial HEP issued 02/15/2020    Status  On-going      PT LONG TERM GOAL #2   Title  Pt will improve bilateral knee strength to >/= 4/5 in order to improve functional mobility and gait.    Baseline  see flow sheets    Period  Weeks    Status  On-going      PT LONG TERM GOAL #3   Title  Pt will be able to amb for > 15 minutes with LRAD with step through gait pattern.    Baseline  Pt reporting walking only short distances at home before requiring a rest break.    Status  On-going      PT LONG TERM GOAL #4   Title  Pt will improve L knee flexion to >/= 115 degrees, in order to improve gait and functional mobility.     Baseline  AROM L knee: 10-102 degrees    Time  6    Period  Weeks    Status  On-going      PT LONG TERM GOAL #5   Title  Pt will improve 5 times sit to stand to </= 15 seconds with/without UE support.    Baseline  24 seconds bilateral UE support from chair.    Status  On-going      Additional Long Term Goals   Additional Long Term Goals  Yes      PT LONG TERM GOAL #6   Title  Pt will improve her BERG score from 35/56 to >/= 42/56.    Time  6    Period  Weeks    Status  New            Plan - 03/01/20 1144    Clinical Impression Statement  Naiyah reports compliance with her HEP and balance did require less UE support today.  She had minimal knee pain today although she still is weak with sit to stand and is slow with gait with her cane.  She will benefit from continued skilled PT to make her gait more narrow and less reliant on an assistive device for more efficient and independent ADLs (cooking, cleaning and self-care).    Personal Factors and Comorbidities  Comorbidity 3+    Comorbidities  HTN, OA, allergeries, L ankle surgery    Examination-Activity Limitations  Squat;Stairs;Stand;Transfers    Examination-Participation Restrictions  Community Activity;Other;Shop;Church    Stability/Clinical Decision Making  Evolving/Moderate complexity    Rehab Potential  Fair    PT Frequency  2x / week    PT Duration  6 weeks    PT Treatment/Interventions  ADLs/Self Care Home Management;Cryotherapy;Electrical Stimulation;Iontophoresis 4mg /ml Dexamethasone;Moist Heat;Ultrasound;Functional mobility training;Stair training;Gait training;Therapeutic activities;Therapeutic exercise;Balance training;Neuromuscular re-education;Patient/family education;Manual techniques;Taping;Dry needling;Passive range of motion;Energy conservation    PT Next Visit Plan  Continue strength, balance  and endurance work. Incorporate into gait and functional activities.    PT Home Exercise Plan  Seated straight leg raises  and standing balance in a doorway (heel to toe)    Consulted and Agree with Plan of Care  Patient       Patient will benefit from skilled therapeutic intervention in order to improve the following deficits and impairments:  Pain, Postural dysfunction, Impaired flexibility, Decreased strength, Decreased activity tolerance, Decreased range of motion, Difficulty walking, Abnormal gait, Decreased balance  Visit Diagnosis: Stiffness of right knee, not elsewhere classified  Stiffness of left knee, not elsewhere classified  Muscle weakness (generalized)  Chronic pain of left knee  Chronic pain of right knee  Difficulty in walking, not elsewhere classified  Other abnormalities of gait and mobility  Unilateral primary osteoarthritis, left knee  Unilateral primary osteoarthritis, right knee     Problem List Patient Active Problem List   Diagnosis Date Noted  . Chronic pain of right knee 06/15/2019  . Unilateral primary osteoarthritis, right knee 06/15/2019  . Chronic pain of left knee 05/12/2018  . Unilateral primary osteoarthritis, left knee 05/12/2018  . Essential hypertension 11/16/2015  . Hx of adenomatous colonic polyps 09/27/2014    Farley Ly PT, MPT 03/01/2020, 1:17 PM  [facility] [address] [place], [place], [address] Phone: [phone]   Fax:  [phone]  Name: Finesse Durocher MRN: [id number] Date of Birth: 1946/10/25

## 2020-03-02 DIAGNOSIS — I10 Essential (primary) hypertension: Secondary | ICD-10-CM | POA: Diagnosis not present

## 2020-03-02 DIAGNOSIS — S065X0D Traumatic subdural hemorrhage without loss of consciousness, subsequent encounter: Secondary | ICD-10-CM | POA: Diagnosis not present

## 2020-03-02 DIAGNOSIS — M170 Bilateral primary osteoarthritis of knee: Secondary | ICD-10-CM | POA: Diagnosis not present

## 2020-03-02 DIAGNOSIS — Z7982 Long term (current) use of aspirin: Secondary | ICD-10-CM | POA: Diagnosis not present

## 2020-03-02 DIAGNOSIS — Z9181 History of falling: Secondary | ICD-10-CM | POA: Diagnosis not present

## 2020-03-02 DIAGNOSIS — S161XXD Strain of muscle, fascia and tendon at neck level, subsequent encounter: Secondary | ICD-10-CM | POA: Diagnosis not present

## 2020-03-02 DIAGNOSIS — Z87891 Personal history of nicotine dependence: Secondary | ICD-10-CM | POA: Diagnosis not present

## 2020-03-03 ENCOUNTER — Encounter: Payer: Medicare HMO | Admitting: Physical Therapy

## 2020-03-07 ENCOUNTER — Encounter: Payer: Self-pay | Admitting: Physical Therapy

## 2020-03-07 ENCOUNTER — Ambulatory Visit: Payer: Medicare HMO | Admitting: Physical Therapy

## 2020-03-07 ENCOUNTER — Other Ambulatory Visit: Payer: Self-pay

## 2020-03-07 DIAGNOSIS — M6281 Muscle weakness (generalized): Secondary | ICD-10-CM | POA: Diagnosis not present

## 2020-03-07 DIAGNOSIS — R262 Difficulty in walking, not elsewhere classified: Secondary | ICD-10-CM | POA: Diagnosis not present

## 2020-03-07 DIAGNOSIS — M25661 Stiffness of right knee, not elsewhere classified: Secondary | ICD-10-CM | POA: Diagnosis not present

## 2020-03-07 DIAGNOSIS — R2689 Other abnormalities of gait and mobility: Secondary | ICD-10-CM | POA: Diagnosis not present

## 2020-03-07 DIAGNOSIS — M25561 Pain in right knee: Secondary | ICD-10-CM | POA: Diagnosis not present

## 2020-03-07 DIAGNOSIS — M25562 Pain in left knee: Secondary | ICD-10-CM | POA: Diagnosis not present

## 2020-03-07 DIAGNOSIS — M25662 Stiffness of left knee, not elsewhere classified: Secondary | ICD-10-CM

## 2020-03-07 DIAGNOSIS — G8929 Other chronic pain: Secondary | ICD-10-CM | POA: Diagnosis not present

## 2020-03-07 NOTE — Therapy (Signed)
[facility] Therapy [address] [place], [place], [address] Phone: [phone]   Fax:  [phone]  Physical Therapy Treatment  Patient Details  Name: Diane Snow MRN: [id number] Date of Birth: 09-13-1946 Referring Provider (PT): Jean Rosenthal, MD   Encounter Date: 03/07/2020  PT End of Session - 03/07/20 1506    Visit Number  6    Number of Visits  12    Date for PT Re-Evaluation  03/28/20    Authorization Type  humana    PT Start Time  1430    PT Stop Time  1510    PT Time Calculation (min)  40 min    Activity Tolerance  Patient tolerated treatment well    Behavior During Therapy  [facility] for tasks assessed/performed       Past Medical History:  Diagnosis Date  . Allergy   . Arthritis   . Hx of adenomatous colonic polyps 09/27/2014  . Hypertension     Past Surgical History:  Procedure Laterality Date  . BREAST BIOPSY Left 1976  . BREAST EXCISIONAL BIOPSY Left   . COLONOSCOPY    . PARTIAL HYSTERECTOMY      There were no vitals filed for this visit.  Subjective Assessment - 03/07/20 1450    Subjective  Pt arriving to therapy reporting no pain. Pt did report that she slipped getting out of bed. Pt reporting no injuries, but she had to have assistance to get up off the floor.    Pertinent History  OA, HTN, , allergies, partial hysterectomy    How long can you sit comfortably?  30-60 minutes    How long can you stand comfortably?  10 minutes    How long can you walk comfortably?  10 minutes    Diagnostic tests  X-ray, tricompartmental arthritis    Patient Stated Goals  "to be more mobile, get around better"    Currently in Pain?  No/denies                       [facility] Adult PT Treatment/Exercise - 03/07/20 0001      Exercises   Exercises  Knee/Hip      Knee/Hip Exercises: Aerobic   Stationary Bike  seat 4 for 6 minutes      Knee/Hip Exercises: Machines for Strengthening   Total Gym Leg Press  3 sets of  10 75#      Knee/Hip Exercises: Standing   Heel Raises  Both;20 reps    Knee Flexion  Both;15 reps    Hip Abduction  Stengthening;Both;15 reps      Knee/Hip Exercises: Seated   Long Arc Quad  AROM;Strengthening;10 reps    Long Arc Quad Limitations  seated LAQ with back support      Knee/Hip Exercises: Supine   Bridges  Strengthening;Both;10 reps    Straight Leg Raises  Strengthening;10 reps    Straight Leg Raises Limitations  verbal instructions to keep quad contracted during entire lift to prevent extensor lag    Other Supine Knee/Hip Exercises  supine marching with core activiation verbal cues for lift height               PT Short Term Goals - 02/23/20 1139      PT SHORT TERM GOAL #1   Title  perform BERG balance and create LTG    Time  2    Period  Weeks    Status  Achieved  PT Long Term Goals - 02/23/20 1140      PT LONG TERM GOAL #1   Title  Pt will be independent in her HEP and progression.    Baseline  initial HEP issued 02/15/2020    Status  On-going      PT LONG TERM GOAL #2   Title  Pt will improve bilateral knee strength to >/= 4/5 in order to improve functional mobility and gait.    Baseline  see flow sheets    Period  Weeks    Status  On-going      PT LONG TERM GOAL #3   Title  Pt will be able to amb for > 15 minutes with LRAD with step through gait pattern.    Baseline  Pt reporting walking only short distances at home before requiring a rest break.    Status  On-going      PT LONG TERM GOAL #4   Title  Pt will improve L knee flexion to >/= 115 degrees, in order to improve gait and functional mobility.    Baseline  AROM L knee: 10-102 degrees    Time  6    Period  Weeks    Status  On-going      PT LONG TERM GOAL #5   Title  Pt will improve 5 times sit to stand to </= 15 seconds with/without UE support.    Baseline  24 seconds bilateral UE support from chair.    Status  On-going      Additional Long Term Goals   Additional Long  Term Goals  Yes      PT LONG TERM GOAL #6   Title  Pt will improve her BERG score from 35/56 to >/= 42/56.    Time  6    Period  Weeks    Status  New            Plan - 03/07/20 1458    Clinical Impression Statement  [name] arriving to therapy reporting no pain today after reporting a fall when getting up out of her bed this past weekend on 03/05/2020. Pt reporting no injuries. Pt requiring increased time for all activities and repeated instructions. Pt tolerating exercises well demonstration provided.  Continue to progress toward pt's goals set.    Personal Factors and Comorbidities  Comorbidity 3+    Comorbidities  HTN, OA, allergeries, L ankle surgery    Examination-Activity Limitations  Squat;Stairs;Stand;Transfers    Examination-Participation Restrictions  Community Activity;Other;Shop;Church    Stability/Clinical Decision Making  Evolving/Moderate complexity    Rehab Potential  Fair    PT Frequency  2x / week    PT Duration  6 weeks    PT Treatment/Interventions  ADLs/Self Care Home Management;Cryotherapy;Electrical Stimulation;Iontophoresis 4mg /ml Dexamethasone;Moist Heat;Ultrasound;Functional mobility training;Stair training;Gait training;Therapeutic activities;Therapeutic exercise;Balance training;Neuromuscular re-education;Patient/family education;Manual techniques;Taping;Dry needling;Passive range of motion;Energy conservation    PT Next Visit Plan  Continue strength, balance and endurance work. Incorporate into gait and functional activities.    PT Home Exercise Plan  Seated straight leg raises and standing balance in a doorway (heel to toe)    Consulted and Agree with Plan of Care  Patient       Patient will benefit from skilled therapeutic intervention in order to improve the following deficits and impairments:  Pain, Postural dysfunction, Impaired flexibility, Decreased strength, Decreased activity tolerance, Decreased range of motion, Difficulty walking, Abnormal gait,  Decreased balance  Visit Diagnosis: Stiffness of right knee, not elsewhere classified  Stiffness  of left knee, not elsewhere classified  Muscle weakness (generalized)  Chronic pain of left knee  Chronic pain of right knee  Difficulty in walking, not elsewhere classified  Other abnormalities of gait and mobility     Problem List Patient Active Problem List   Diagnosis Date Noted  . Chronic pain of right knee 06/15/2019  . Unilateral primary osteoarthritis, right knee 06/15/2019  . Chronic pain of left knee 05/12/2018  . Unilateral primary osteoarthritis, left knee 05/12/2018  . Essential hypertension 11/16/2015  . Hx of adenomatous colonic polyps 09/27/2014    Oretha Caprice , PT, MPT 03/07/2020, 3:07 PM  [facility] Physical Therapy [address] [place], [place], [address] Phone: [phone]   Fax:  [phone]  Name: Diane Snow MRN: [id number] Date of Birth: 08-13-1946

## 2020-03-08 ENCOUNTER — Encounter: Payer: Self-pay | Admitting: Clinical

## 2020-03-08 ENCOUNTER — Encounter: Payer: Self-pay | Admitting: Internal Medicine

## 2020-03-08 ENCOUNTER — Ambulatory Visit (INDEPENDENT_AMBULATORY_CARE_PROVIDER_SITE_OTHER): Payer: Medicare HMO | Admitting: Internal Medicine

## 2020-03-08 VITALS — BP 136/84 | HR 68 | Resp 12 | Ht <= 58 in | Wt 140.0 lb

## 2020-03-08 DIAGNOSIS — R634 Abnormal weight loss: Secondary | ICD-10-CM

## 2020-03-08 DIAGNOSIS — F341 Dysthymic disorder: Secondary | ICD-10-CM

## 2020-03-08 DIAGNOSIS — R2681 Unsteadiness on feet: Secondary | ICD-10-CM | POA: Diagnosis not present

## 2020-03-08 DIAGNOSIS — R531 Weakness: Secondary | ICD-10-CM | POA: Diagnosis not present

## 2020-03-08 NOTE — Patient Instructions (Signed)
Bring in labs from Dr. Lina Sar visits for me to view
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
## 2020-03-09 ENCOUNTER — Other Ambulatory Visit: Payer: Self-pay

## 2020-03-09 ENCOUNTER — Telehealth: Payer: Self-pay | Admitting: Clinical

## 2020-03-09 ENCOUNTER — Encounter: Payer: Self-pay | Admitting: Physical Therapy

## 2020-03-09 ENCOUNTER — Ambulatory Visit: Payer: Medicare HMO | Admitting: Physical Therapy

## 2020-03-09 DIAGNOSIS — M6281 Muscle weakness (generalized): Secondary | ICD-10-CM

## 2020-03-09 DIAGNOSIS — G8929 Other chronic pain: Secondary | ICD-10-CM

## 2020-03-09 DIAGNOSIS — M25662 Stiffness of left knee, not elsewhere classified: Secondary | ICD-10-CM | POA: Diagnosis not present

## 2020-03-09 DIAGNOSIS — M25562 Pain in left knee: Secondary | ICD-10-CM | POA: Diagnosis not present

## 2020-03-09 DIAGNOSIS — M25661 Stiffness of right knee, not elsewhere classified: Secondary | ICD-10-CM

## 2020-03-09 DIAGNOSIS — R262 Difficulty in walking, not elsewhere classified: Secondary | ICD-10-CM | POA: Diagnosis not present

## 2020-03-09 DIAGNOSIS — M25561 Pain in right knee: Secondary | ICD-10-CM

## 2020-03-09 DIAGNOSIS — R2689 Other abnormalities of gait and mobility: Secondary | ICD-10-CM

## 2020-03-09 NOTE — Therapy (Signed)
[facility] Physical Therapy [address] [place], [place], [address] Phone: [phone]   Fax:  [phone]  Physical Therapy Treatment  Patient Details  Name: Diane Snow MRN: [id number] Date of Birth: 1946-08-01 Referring Provider (PT): Jean Rosenthal, MD   Encounter Date: 03/09/2020  PT End of Session - 03/09/20 1458    Visit Number  7    Number of Visits  12    Date for PT Re-Evaluation  03/28/20    Authorization Type  humana    PT Start Time  1345    PT Stop Time  1430    PT Time Calculation (min)  45 min    Equipment Utilized During Treatment  Gait belt    Activity Tolerance  Patient tolerated treatment well    Behavior During Therapy  [facility] for tasks assessed/performed       Past Medical History:  Diagnosis Date  . Allergy   . Arthritis   . Hx of adenomatous colonic polyps 09/27/2014  . Hypertension     Past Surgical History:  Procedure Laterality Date  . BREAST BIOPSY Left 1976  . BREAST EXCISIONAL BIOPSY Left   . COLONOSCOPY    . PARTIAL HYSTERECTOMY     fibroid tumors    There were no vitals filed for this visit.  Subjective Assessment - 03/09/20 1448    Subjective  Pt arriving to therapy and reporting no pain.    Pertinent History  OA, HTN, , allergies, partial hysterectomy    How long can you sit comfortably?  30-60 minutes    How long can you stand comfortably?  10 minutes    How long can you walk comfortably?  10 minutes    Diagnostic tests  X-ray, tricompartmental arthritis    Patient Stated Goals  "to be more mobile, get around better"    Currently in Pain?  No/denies                        [facility] Adult PT Treatment/Exercise - 03/09/20 0001      Exercises   Exercises  Knee/Hip      Knee/Hip Exercises: Standing   Heel Raises  Both;20 reps    Knee Flexion  Both;15 reps    Hip Flexion  AROM;Stengthening;Both;15 reps    Hip Abduction  Stengthening;Both;15 reps;Limitations    Other Standing  Knee Exercises  stepping over theraband on the floor to elicit increased hip and knee flexion in gait and improve foot clearance.    UE support   Other Standing Knee Exercises  tapping 4 inch step using heel x 10 each LE   UE support     Knee/Hip Exercises: Seated   Long Arc Quad  AROM;Strengthening;10 reps    Long Arc Quad Limitations  seated LAQ with back support    Marching  15 reps    Marching Limitations  with back support    Sit to [employer]  10 reps;with UE support               PT Short Term Goals - 02/23/20 1139      PT SHORT TERM GOAL #1   Title  perform BERG balance and create LTG    Time  2    Period  Weeks    Status  Achieved        PT Long Term Goals - 03/09/20 1504      PT LONG TERM GOAL #1   Title  Pt will  be independent in her HEP and progression.    Baseline  initial HEP issued 02/15/2020    Period  Weeks    Status  On-going      PT LONG TERM GOAL #2   Title  Pt will improve bilateral knee strength to >/= 4/5 in order to improve functional mobility and gait.    Status  On-going      PT LONG TERM GOAL #3   Title  Pt will be able to amb for > 15 minutes with LRAD with step through gait pattern.    Baseline  Pt reporting walking only short distances at home before requiring a rest break.    Status  On-going      PT LONG TERM GOAL #4   Title  Pt will improve L knee flexion to >/= 115 degrees, in order to improve gait and functional mobility.    Baseline  AROM L knee: 10-102 degrees    Time  6    Period  Weeks    Status  On-going      PT LONG TERM GOAL #5   Title  Pt will improve 5 times sit to stand to </= 15 seconds with/without UE support.    Baseline  24 seconds bilateral UE support from chair.    Status  On-going            Plan - 03/09/20 1459    Clinical Impression Statement  Caylor arriving to theapy reporting no pain. Pt reporting no falls since out last visit. Pt tolerating exericses well requiring increased time for all  activities. Pt with limited therapy today due pt having to take a rest room break. We focused on seated and standing exercises. Continue skilled PT as pt tolerates.    Personal Factors and Comorbidities  Comorbidity 3+    Comorbidities  HTN, OA, allergeries, L ankle surgery    Examination-Activity Limitations  Squat;Stairs;Stand;Transfers    Stability/Clinical Decision Making  Evolving/Moderate complexity    Rehab Potential  Fair    PT Frequency  2x / week    PT Duration  6 weeks    PT Treatment/Interventions  ADLs/Self Care Home Management;Cryotherapy;Electrical Stimulation;Iontophoresis 4mg /ml Dexamethasone;Moist Heat;Ultrasound;Functional mobility training;Stair training;Gait training;Therapeutic activities;Therapeutic exercise;Balance training;Neuromuscular re-education;Patient/family education;Manual techniques;Taping;Dry needling;Passive range of motion;Energy conservation    PT Next Visit Plan  Continue strength, balance and endurance work. Incorporate into gait and functional activities.    PT Home Exercise Plan  Seated straight leg raises and standing balance in a doorway (heel to toe)    Consulted and Agree with Plan of Care  Patient       Patient will benefit from skilled therapeutic intervention in order to improve the following deficits and impairments:  Pain, Postural dysfunction, Impaired flexibility, Decreased strength, Decreased activity tolerance, Decreased range of motion, Difficulty walking, Abnormal gait, Decreased balance  Visit Diagnosis: Stiffness of right knee, not elsewhere classified  Stiffness of left knee, not elsewhere classified  Muscle weakness (generalized)  Chronic pain of left knee  Chronic pain of right knee  Difficulty in walking, not elsewhere classified  Other abnormalities of gait and mobility     Problem List Patient Active Problem List   Diagnosis Date Noted  . Chronic pain of right knee 06/15/2019  . Unilateral primary osteoarthritis,  right knee 06/15/2019  . Chronic pain of left knee 05/12/2018  . Unilateral primary osteoarthritis, left knee 05/12/2018  . Essential hypertension 11/16/2015  . Hx of adenomatous colonic polyps 09/27/2014  Oretha Caprice, PT, MPT 03/09/2020, 3:13 PM  [facility] Physical Therapy [address] [place], [place], [address] Phone: [phone]   Fax:  [phone]  Name: Diane Snow MRN: [id number] Date of Birth: [birth year]

## 2020-03-09 NOTE — Telephone Encounter (Signed)
LCSW contacted patient as a referral from provider to see if she was still interested in counseling. LCSW left VM.

## 2020-03-11 DIAGNOSIS — S065X0D Traumatic subdural hemorrhage without loss of consciousness, subsequent encounter: Secondary | ICD-10-CM | POA: Diagnosis not present

## 2020-03-11 DIAGNOSIS — I10 Essential (primary) hypertension: Secondary | ICD-10-CM | POA: Diagnosis not present

## 2020-03-11 DIAGNOSIS — Z87891 Personal history of nicotine dependence: Secondary | ICD-10-CM | POA: Diagnosis not present

## 2020-03-11 DIAGNOSIS — Z9181 History of falling: Secondary | ICD-10-CM | POA: Diagnosis not present

## 2020-03-11 DIAGNOSIS — Z7982 Long term (current) use of aspirin: Secondary | ICD-10-CM | POA: Diagnosis not present

## 2020-03-11 DIAGNOSIS — M170 Bilateral primary osteoarthritis of knee: Secondary | ICD-10-CM | POA: Diagnosis not present

## 2020-03-11 DIAGNOSIS — S161XXD Strain of muscle, fascia and tendon at neck level, subsequent encounter: Secondary | ICD-10-CM | POA: Diagnosis not present

## 2020-03-14 ENCOUNTER — Other Ambulatory Visit: Payer: Self-pay

## 2020-03-14 ENCOUNTER — Encounter: Payer: Self-pay | Admitting: Physical Therapy

## 2020-03-14 ENCOUNTER — Ambulatory Visit: Payer: Medicare HMO | Admitting: Physical Therapy

## 2020-03-14 DIAGNOSIS — M6281 Muscle weakness (generalized): Secondary | ICD-10-CM

## 2020-03-14 DIAGNOSIS — M25561 Pain in right knee: Secondary | ICD-10-CM | POA: Diagnosis not present

## 2020-03-14 DIAGNOSIS — M25662 Stiffness of left knee, not elsewhere classified: Secondary | ICD-10-CM | POA: Diagnosis not present

## 2020-03-14 DIAGNOSIS — M25661 Stiffness of right knee, not elsewhere classified: Secondary | ICD-10-CM | POA: Diagnosis not present

## 2020-03-14 DIAGNOSIS — R262 Difficulty in walking, not elsewhere classified: Secondary | ICD-10-CM

## 2020-03-14 DIAGNOSIS — M25562 Pain in left knee: Secondary | ICD-10-CM

## 2020-03-14 DIAGNOSIS — R2689 Other abnormalities of gait and mobility: Secondary | ICD-10-CM

## 2020-03-14 DIAGNOSIS — G8929 Other chronic pain: Secondary | ICD-10-CM

## 2020-03-14 NOTE — Therapy (Signed)
[facility] Physical Therapy [address] [place], [place], [address] Phone: [phone]   Fax:  [phone]  Physical Therapy Treatment  Patient Details  Name: Diane Snow MRN: [id number] Date of Birth: 11-Apr-1946 Referring Provider (PT): Jean Rosenthal, MD   Encounter Date: 03/14/2020  PT End of Session - 03/14/20 1439    Visit Number  8    Number of Visits  12    Date for PT Re-Evaluation  03/28/20    Authorization Type  humana    PT Start Time  1432    PT Stop Time  1514    PT Time Calculation (min)  42 min    Activity Tolerance  Patient tolerated treatment well    Behavior During Therapy  [facility] for tasks assessed/performed       Past Medical History:  Diagnosis Date  . Allergy   . Arthritis   . Hx of adenomatous colonic polyps 09/27/2014  . Hypertension     Past Surgical History:  Procedure Laterality Date  . BREAST BIOPSY Left 1976  . BREAST EXCISIONAL BIOPSY Left   . COLONOSCOPY    . PARTIAL HYSTERECTOMY     fibroid tumors    There were no vitals filed for this visit.  Subjective Assessment - 03/14/20 1438    Subjective  Pt arriving to therpay reporting 1/10 pain in left knee today. Pt reporting soreness.    Pertinent History  OA, HTN, , allergies, partial hysterectomy    Currently in Pain?  Yes    Pain Score  1     Pain Location  Knee    Pain Orientation  Left    Pain Descriptors / Indicators  Sore    Pain Type  Chronic pain    Pain Onset  More than a month ago                        [facility] Adult PT Treatment/Exercise - 03/14/20 0001      Exercises   Exercises  Knee/Hip      Knee/Hip Exercises: Stretches   Active Hamstring Stretch  Both;2 reps;30 seconds      Knee/Hip Exercises: Aerobic   Stationary Bike  L1.3 UE and LE's for 6 minutes      Knee/Hip Exercises: Standing   Heel Raises  Both;20 reps    Knee Flexion  Both;15 reps    Hip Flexion  AROM;Stengthening;Both;15 reps    Hip Abduction   Stengthening;Both;15 reps;Limitations    Forward Step Up  Both;15 reps;Hand Hold: 1;Step Height: 4";Limitations    Forward Step Up Limitations  using cane in R hand for added support    Other Standing Knee Exercises  marching x 20 reps with UE support, hamstring curls x 15 reps each LE with UE support    Other Standing Knee Exercises  tapping 4 inch step using heel x 10 each LE   UE support     Knee/Hip Exercises: Seated   Long Arc Quad  AROM;Strengthening;10 reps    Long Arc Quad Weight  3 lbs.    Long Arc Quad Limitations  seated LAQ with back support    Sit to [employer]  3 sets;with UE support   best time was 22 seconds              PT Short Term Goals - 03/14/20 1456      PT SHORT TERM GOAL #1   Title  perform BERG balance and create LTG  Status  Achieved        PT Long Term Goals - 03/14/20 1457      PT LONG TERM GOAL #1   Baseline  initial HEP issued 02/15/2020    Status  On-going      PT LONG TERM GOAL #2   Title  Pt will improve bilateral knee strength to >/= 4/5 in order to improve functional mobility and gait.    Status  On-going      PT LONG TERM GOAL #3   Title  Pt will be able to amb for > 15 minutes with LRAD with step through gait pattern.    Status  On-going      PT LONG TERM GOAL #4   Title  Pt will improve L knee flexion to >/= 115 degrees, in order to improve gait and functional mobility.    Status  On-going      PT LONG TERM GOAL #5   Title  Pt will improve 5 times sit to stand to </= 15 seconds with/without UE support.    Status  On-going      PT LONG TERM GOAL #6   Title  Pt will improve her BERG score from 35/56 to >/= 42/56.    Status  On-going            Plan - 03/14/20 1447    Clinical Impression Statement  Pt arriving to therapy reporting 1/10 pain in her left knee today. Pt still amb with shuffeling gait with decreased foot clearance bilatereally. Pt requiring increased time for all activities. Pt requried 4 minutes to get  onto our recumbant bike and required assistance for foot placement and verbal instructions for seated position. Concentration on LE strength, L knee AROM and increased hip flexion in order to improve gait. Continue skilled PT.    Personal Factors and Comorbidities  Comorbidity 3+    Comorbidities  HTN, OA, allergeries, L ankle surgery    Examination-Activity Limitations  Squat;Stairs;Stand;Transfers    Examination-Participation Restrictions  Community Activity;Other;Shop;Church    Stability/Clinical Decision Making  Evolving/Moderate complexity    Rehab Potential  Fair    PT Frequency  2x / week    PT Duration  6 weeks    PT Treatment/Interventions  ADLs/Self Care Home Management;Cryotherapy;Electrical Stimulation;Iontophoresis 4mg /ml Dexamethasone;Moist Heat;Ultrasound;Functional mobility training;Stair training;Gait training;Therapeutic activities;Therapeutic exercise;Balance training;Neuromuscular re-education;Patient/family education;Manual techniques;Taping;Dry needling;Passive range of motion;Energy conservation    PT Next Visit Plan  Continue strength, balance and endurance work. Incorporate into gait and functional activities.    PT Home Exercise Plan  Seated straight leg raises and standing balance in a doorway (heel to toe)    Consulted and Agree with Plan of Care  Patient       Patient will benefit from skilled therapeutic intervention in order to improve the following deficits and impairments:  Pain, Postural dysfunction, Impaired flexibility, Decreased strength, Decreased activity tolerance, Decreased range of motion, Difficulty walking, Abnormal gait, Decreased balance  Visit Diagnosis: Stiffness of right knee, not elsewhere classified  Stiffness of left knee, not elsewhere classified  Chronic pain of left knee  Muscle weakness (generalized)  Chronic pain of right knee  Difficulty in walking, not elsewhere classified  Other abnormalities of gait and  mobility     Problem List Patient Active Problem List   Diagnosis Date Noted  . Chronic pain of right knee 06/15/2019  . Unilateral primary osteoarthritis, right knee 06/15/2019  . Chronic pain of left knee 05/12/2018  . Unilateral primary osteoarthritis,  left knee 05/12/2018  . Essential hypertension 11/16/2015  . Hx of adenomatous colonic polyps 09/27/2014    Oretha Caprice, PT, MPt 03/14/2020, 3:13 PM  [facility] Physical Therapy [address] [place], [place], [address] Phone: [phone]   Fax:  [phone]  Name: Diane Snow MRN: [id number] Date of Birth: 07-28-1946

## 2020-03-15 ENCOUNTER — Telehealth: Payer: Self-pay | Admitting: Internal Medicine

## 2020-03-15 DIAGNOSIS — H9193 Unspecified hearing loss, bilateral: Secondary | ICD-10-CM

## 2020-03-15 NOTE — Telephone Encounter (Signed)
Patient called stating forgot to mention to Dr. Amil Amen when was seen here last week that will like to get a hearing test done. Patient will like to know if that is something can be done here at the clinic.

## 2020-03-16 ENCOUNTER — Ambulatory Visit (INDEPENDENT_AMBULATORY_CARE_PROVIDER_SITE_OTHER): Payer: Medicare HMO | Admitting: Physical Therapy

## 2020-03-16 ENCOUNTER — Other Ambulatory Visit: Payer: Self-pay

## 2020-03-16 ENCOUNTER — Encounter: Payer: Self-pay | Admitting: Physical Therapy

## 2020-03-16 DIAGNOSIS — M6281 Muscle weakness (generalized): Secondary | ICD-10-CM | POA: Diagnosis not present

## 2020-03-16 DIAGNOSIS — G8929 Other chronic pain: Secondary | ICD-10-CM | POA: Diagnosis not present

## 2020-03-16 DIAGNOSIS — M25662 Stiffness of left knee, not elsewhere classified: Secondary | ICD-10-CM | POA: Diagnosis not present

## 2020-03-16 DIAGNOSIS — R262 Difficulty in walking, not elsewhere classified: Secondary | ICD-10-CM | POA: Diagnosis not present

## 2020-03-16 DIAGNOSIS — M25562 Pain in left knee: Secondary | ICD-10-CM | POA: Diagnosis not present

## 2020-03-16 DIAGNOSIS — M25661 Stiffness of right knee, not elsewhere classified: Secondary | ICD-10-CM

## 2020-03-16 DIAGNOSIS — R531 Weakness: Secondary | ICD-10-CM | POA: Insufficient documentation

## 2020-03-16 DIAGNOSIS — R634 Abnormal weight loss: Secondary | ICD-10-CM | POA: Insufficient documentation

## 2020-03-16 DIAGNOSIS — R2681 Unsteadiness on feet: Secondary | ICD-10-CM | POA: Insufficient documentation

## 2020-03-16 DIAGNOSIS — M25561 Pain in right knee: Secondary | ICD-10-CM | POA: Diagnosis not present

## 2020-03-16 DIAGNOSIS — F341 Dysthymic disorder: Secondary | ICD-10-CM | POA: Insufficient documentation

## 2020-03-16 NOTE — Telephone Encounter (Signed)
Referring to [facility]. Also, would like for her to come in for CBC, vitamin B12 and RBC folate (for balance issues)  TSH (and the CBC) for weight loss.  And would like to see her walk around in the parking lot when she comes in prior to her sitting--before she gets stiff.

## 2020-03-16 NOTE — Therapy (Signed)
[facility] Physical Therapy [address] [place], [place], [address] Phone: [phone]   Fax:  [phone]  Physical Therapy Treatment  Patient Details  Name: Diane Snow MRN: [id number] Date of Birth: [birth year] Referring Provider (PT): Jean Rosenthal, MD   Encounter Date: 03/16/2020  PT End of Session - 03/16/20 1506    Visit Number  9    Number of Visits  12    Date for PT Re-Evaluation  03/28/20    Authorization Type  humana    PT Start Time  1436    PT Stop Time  1515    PT Time Calculation (min)  39 min    Activity Tolerance  Patient tolerated treatment well    Behavior During Therapy  [facility] for tasks assessed/performed       Past Medical History:  Diagnosis Date  . Allergy   . Arthritis   . Hx of adenomatous colonic polyps 09/27/2014  . Hypertension     Past Surgical History:  Procedure Laterality Date  . BREAST BIOPSY Left 1976  . BREAST EXCISIONAL BIOPSY Left   . COLONOSCOPY    . PARTIAL HYSTERECTOMY     fibroid tumors    There were no vitals filed for this visit.  Subjective Assessment - 03/16/20 1443    Subjective  Pt arriving to theapy reporting no pain. Once pt started exercising she reported mild pain of 1-2/10 in her L knee.    Pertinent History  OA, HTN, , allergies, partial hysterectomy    How long can you sit comfortably?  30-60 minutes    Diagnostic tests  X-ray, tricompartmental arthritis    Patient Stated Goals  "to be more mobile, get around better"    Currently in Pain?  Yes    Pain Score  2     Pain Location  Knee    Pain Orientation  Left    Pain Descriptors / Indicators  Sore    Pain Type  Chronic pain    Pain Onset  More than a month ago         [facility] PT Assessment - 03/16/20 0001      Ambulation/Gait   Pre-Gait Activities  stepping over and back theraband in the floor x 20 with each LE with bilateral UE support (one TM rail and st cane in R hand)    Gait Comments  Pt having trouble with  transitions and gait initiation. Gait training with straight cane instructions in heel to toe gait pattern.                     [facility] Adult PT Treatment/Exercise - 03/16/20 0001      Knee/Hip Exercises: Aerobic   Nustep  L4 x 6 minutes      Knee/Hip Exercises: Standing   Heel Raises  Both;20 reps    Knee Flexion  Both;15 reps    Hip Flexion  AROM;Stengthening;Both;15 reps    Hip Abduction  Stengthening;Both;15 reps;Limitations    Forward Step Up  Both;15 reps;Hand Hold: 1;Step Height: 4";Limitations    Other Standing Knee Exercises  sit to stand using bilateral UE support, pt needed instructions for control descending motion             PT Education - 03/16/20 1610    Education Details  Pt requiring repeated instructions for all exercises and activities.    Person(s) Educated  Patient    Methods  Explanation;Demonstration;Verbal cues;Tactile cues    Comprehension  Verbalized understanding;Returned  demonstration;Verbal cues required;Tactile cues required;Need further instruction       PT Short Term Goals - 03/14/20 1456      PT SHORT TERM GOAL #1   Title  perform BERG balance and create LTG    Status  Achieved        PT Long Term Goals - 03/14/20 1457      PT LONG TERM GOAL #1   Baseline  initial HEP issued 02/15/2020    Status  On-going      PT LONG TERM GOAL #2   Title  Pt will improve bilateral knee strength to >/= 4/5 in order to improve functional mobility and gait.    Status  On-going      PT LONG TERM GOAL #3   Title  Pt will be able to amb for > 15 minutes with LRAD with step through gait pattern.    Status  On-going      PT LONG TERM GOAL #4   Title  Pt will improve L knee flexion to >/= 115 degrees, in order to improve gait and functional mobility.    Status  On-going      PT LONG TERM GOAL #5   Title  Pt will improve 5 times sit to stand to </= 15 seconds with/without UE support.    Status  On-going      PT LONG TERM GOAL #6    Title  Pt will improve her BERG score from 35/56 to >/= 42/56.    Status  On-going            Plan - 03/16/20 1508    Clinical Impression Statement  Pt arriving to therapy 5 minutes late. Pt amb with shuffeling gait pattern with decreased bilateral foot clearance bilaterally but appearing worse on the left today. Pt requiring increased time for all activities and exercises with decreased initiation and freezing patterns noted. Pt is progressively getting worse with her functional movement patterns and I feel she is at a higher risk of falls.  Concentration today on pre-gait and gait exercises as well as sit to stand activities. I feel pt could benefit from neurology consult. Continue skilled PT as appropriate.    Personal Factors and Comorbidities  Comorbidity 3+    Comorbidities  HTN, OA, allergeries, L ankle surgery    Examination-Activity Limitations  Squat;Stairs;Stand;Transfers    Examination-Participation Restrictions  Community Activity;Other;Shop;Church    Stability/Clinical Decision Making  Evolving/Moderate complexity    Rehab Potential  Fair    PT Frequency  2x / week    PT Duration  6 weeks    PT Treatment/Interventions  ADLs/Self Care Home Management;Cryotherapy;Electrical Stimulation;Iontophoresis 4mg /ml Dexamethasone;Moist Heat;Ultrasound;Functional mobility training;Stair training;Gait training;Therapeutic activities;Therapeutic exercise;Balance training;Neuromuscular re-education;Patient/family education;Manual techniques;Taping;Dry needling;Passive range of motion;Energy conservation    PT Next Visit Plan  Continue strength, balance and endurance work. Incorporate into gait and functional activities.    PT Home Exercise Plan  Seated straight leg raises and standing balance in a doorway (heel to toe)    Recommended Other Services  Recommending Neurology consult    Consulted and Agree with Plan of Care  Patient       Patient will benefit from skilled therapeutic  intervention in order to improve the following deficits and impairments:  Pain, Postural dysfunction, Impaired flexibility, Decreased strength, Decreased activity tolerance, Decreased range of motion, Difficulty walking, Abnormal gait, Decreased balance  Visit Diagnosis: Stiffness of right knee, not elsewhere classified  Stiffness of left knee, not elsewhere classified  Chronic pain of left knee  Muscle weakness (generalized)  Chronic pain of right knee  Difficulty in walking, not elsewhere classified     Problem List Patient Active Problem List   Diagnosis Date Noted  . Left-sided weakness 03/16/2020  . Weight loss 03/16/2020  . Gait instability 03/16/2020  . Dysthymia 03/16/2020  . Chronic pain of right knee 06/15/2019  . Unilateral primary osteoarthritis, right knee 06/15/2019  . Chronic pain of left knee 05/12/2018  . Unilateral primary osteoarthritis, left knee 05/12/2018  . Essential hypertension 11/16/2015  . Hx of adenomatous colonic polyps 09/27/2014    Oretha Caprice, PT, MPT 03/16/2020, 4:18 PM  [facility] Physical Therapy [address] [place], [place], [address] Phone: [phone]   Fax:  [phone]  Name: Diyora Brodbeck MRN: [id number] Date of Birth: June 17, 1946

## 2020-03-18 ENCOUNTER — Telehealth: Payer: Self-pay | Admitting: Clinical

## 2020-03-18 NOTE — Telephone Encounter (Signed)
Spoke with patient. Lab appointment scheduled for Tuesday. Patient verbalized understanding of walking around the parking lot when she comes

## 2020-03-18 NOTE — Telephone Encounter (Signed)
able to speak with patient to reschedule due to canceled appointment by provider due to illness. Appointment scheduled for 05/25 3pm

## 2020-03-21 ENCOUNTER — Encounter: Payer: Self-pay | Admitting: Physical Therapy

## 2020-03-21 ENCOUNTER — Encounter: Payer: Self-pay | Admitting: Orthopaedic Surgery

## 2020-03-21 ENCOUNTER — Ambulatory Visit: Payer: Medicare HMO | Admitting: Physical Therapy

## 2020-03-21 ENCOUNTER — Other Ambulatory Visit: Payer: Self-pay

## 2020-03-21 ENCOUNTER — Ambulatory Visit: Payer: Medicare HMO | Admitting: Orthopaedic Surgery

## 2020-03-21 DIAGNOSIS — M25562 Pain in left knee: Secondary | ICD-10-CM

## 2020-03-21 DIAGNOSIS — G8929 Other chronic pain: Secondary | ICD-10-CM

## 2020-03-21 DIAGNOSIS — R29898 Other symptoms and signs involving the musculoskeletal system: Secondary | ICD-10-CM | POA: Diagnosis not present

## 2020-03-21 DIAGNOSIS — R262 Difficulty in walking, not elsewhere classified: Secondary | ICD-10-CM | POA: Diagnosis not present

## 2020-03-21 DIAGNOSIS — M6281 Muscle weakness (generalized): Secondary | ICD-10-CM | POA: Diagnosis not present

## 2020-03-21 DIAGNOSIS — M25662 Stiffness of left knee, not elsewhere classified: Secondary | ICD-10-CM | POA: Diagnosis not present

## 2020-03-21 DIAGNOSIS — M25661 Stiffness of right knee, not elsewhere classified: Secondary | ICD-10-CM

## 2020-03-21 DIAGNOSIS — R2689 Other abnormalities of gait and mobility: Secondary | ICD-10-CM

## 2020-03-21 DIAGNOSIS — M25561 Pain in right knee: Secondary | ICD-10-CM | POA: Diagnosis not present

## 2020-03-21 NOTE — Therapy (Addendum)
[facility] Physical Therapy [address] [place], [place], [address] Phone: [phone]   Fax:  [phone]  Physical Therapy Treatment/Progress Note/Discharge Summary  Patient Details  Name: Diane Snow MRN: [id number] Date of Birth: 1945/12/31 Referring Provider (PT): Jean Rosenthal, MD   Progress Note Reporting Period 02/15/20 to 03/21/20  See note below for Objective Data and Assessment of Progress/Goals.       Encounter Date: 03/21/2020  PT End of Session - 03/21/20 1514    Visit Number  10    Number of Visits  12    Date for PT Re-Evaluation  03/28/20    Authorization Type  humana    PT Start Time  1416    PT Stop Time  1510    PT Time Calculation (min)  54 min    Equipment Utilized During Treatment  Gait belt    Activity Tolerance  Patient tolerated treatment well    Behavior During Therapy  WFL for tasks assessed/performed       Past Medical History:  Diagnosis Date  . Allergy   . Arthritis   . Hx of adenomatous colonic polyps 09/27/2014  . Hypertension     Past Surgical History:  Procedure Laterality Date  . BREAST BIOPSY Left 1976  . BREAST EXCISIONAL BIOPSY Left   . COLONOSCOPY    . PARTIAL HYSTERECTOMY     fibroid tumors    There were no vitals filed for this visit.  Subjective Assessment - 03/21/20 1416    Subjective  "The doctor said there's nothing he can do for me."    Pertinent History  OA, HTN, , allergies, partial hysterectomy    How long can you sit comfortably?  30-60 minutes    Diagnostic tests  X-ray, tricompartmental arthritis    Patient Stated Goals  "to be more mobile, get around better"    Currently in Pain?  No/denies    Pain Onset  More than a month ago         [facility] PT Assessment - 03/21/20 1443      Assessment   Medical Diagnosis  bilateral LE stiffness/weakness    Referring Provider (PT)  Jean Rosenthal, MD      Transfers   Five time sit to stand comments   35.75 sec with bil  UE support - difficulty achieving full upright posture   53 sec without UE support     Berg Balance Test   Sit to Stand  Able to stand  independently using hands  (Pended)     Standing Unsupported  Able to stand safely 2 minutes  (Pended)     Sitting with Back Unsupported but Feet Supported on Floor or Stool  Able to sit safely and securely 2 minutes  (Pended)     Stand to Sit  Sits independently, has uncontrolled descent  (Pended)     Transfers  Able to transfer safely, definite need of hands  (Pended)     Standing Unsupported with Eyes Closed  Able to stand 10 seconds safely  (Pended)     Standing Unsupported with Feet Together  Able to place feet together independently and stand 1 minute safely  (Pended)     From Standing, Reach Forward with Outstretched Arm  Can reach forward >12 cm safely (5")  (Pended)     From Standing Position, Pick up Object from Floor  Able to pick up shoe, needs supervision  (Pended)     From Standing Position, Turn to Look Behind  Over each Shoulder  Turn sideways only but maintains balance  (Pended)     Turn 360 Degrees  Able to turn 360 degrees safely but slowly  (Pended)     Standing Unsupported, Alternately Place Feet on Step/Stool  Able to complete >2 steps/needs minimal assist  (Pended)     Standing Unsupported, One Foot in Front  Able to take small step independently and hold 30 seconds  (Pended)     Standing on One Leg  Tries to lift leg/unable to hold 3 seconds but remains standing independently  (Pended)     Total Score  37  (Pended)                     [facility] Adult PT Treatment/Exercise - 03/21/20 1434      Knee/Hip Exercises: Aerobic   Nustep  L5 x 8 minutes   increased time due to inability to talk and exercise               PT Short Term Goals - 03/14/20 1456      PT SHORT TERM GOAL #1   Title  perform BERG balance and create LTG    Status  Achieved        PT Long Term Goals - 03/21/20 1515      PT LONG TERM GOAL #1    Title  Pt will be independent in her HEP and progression.    Baseline  initial HEP issued 02/15/2020    Status  On-going    Target Date  03/28/20      PT LONG TERM GOAL #2   Title  Pt will improve bilateral knee strength to >/= 4/5 in order to improve functional mobility and gait.    Baseline  NT due to time constraints    Status  On-going      PT LONG TERM GOAL #3   Title  Pt will be able to amb for > 15 minutes with LRAD with step through gait pattern.    Baseline  NT due to time constraints    Status  On-going      PT LONG TERM GOAL #4   Title  Pt will improve L knee flexion to >/= 115 degrees, in order to improve gait and functional mobility.    Baseline  NT due to time constraints    Status  On-going      PT LONG TERM GOAL #5   Title  Pt will improve 5 times sit to stand to </= 15 seconds with/without UE support.    Baseline  5/24: decline in time to 35 sec with UE support    Status  On-going      PT LONG TERM GOAL #6   Title  Pt will improve her BERG score from 35/56 to >/= 42/56.    Baseline  5/34: 37/56    Status  On-going            Plan - 03/21/20 1516    Clinical Impression Statement  Progress note completed today fro 10th visit.  Unable to assess all goals due to time constraints.  Overall limited progress noted at this time and continue to support primary PT's recommendation for neurology consult (specifically movement disorders).  Pt to see MD and LCSW tomorrow, and will plan to follow up to determine next steps with PT.    Personal Factors and Comorbidities  Comorbidity 3+    Comorbidities  HTN, OA, allergeries, L ankle surgery  Examination-Activity Limitations  Squat;Stairs;Stand;Transfers    Examination-Participation Restrictions  Community Activity;Other;Shop;Church    Stability/Clinical Decision Making  Evolving/Moderate complexity    Rehab Potential  Fair    PT Frequency  2x / week    PT Duration  6 weeks    PT Treatment/Interventions  ADLs/Self  Care Home Management;Cryotherapy;Electrical Stimulation;Iontophoresis 38m/ml Dexamethasone;Moist Heat;Ultrasound;Functional mobility training;Stair training;Gait training;Therapeutic activities;Therapeutic exercise;Balance training;Neuromuscular re-education;Patient/family education;Manual techniques;Taping;Dry needling;Passive range of motion;Energy conservation    PT Next Visit Plan  see what MD says, may need to hold if getting neuro consult.  try PWR moves    PT Home Exercise Plan  Seated straight leg raises and standing balance in a doorway (heel to toe)    Consulted and Agree with Plan of Care  Patient       Patient will benefit from skilled therapeutic intervention in order to improve the following deficits and impairments:  Pain, Postural dysfunction, Impaired flexibility, Decreased strength, Decreased activity tolerance, Decreased range of motion, Difficulty walking, Abnormal gait, Decreased balance  Visit Diagnosis: Stiffness of right knee, not elsewhere classified  Stiffness of left knee, not elsewhere classified  Chronic pain of left knee  Muscle weakness (generalized)  Chronic pain of right knee  Difficulty in walking, not elsewhere classified  Other abnormalities of gait and mobility     Problem List Patient Active Problem List   Diagnosis Date Noted  . Left-sided weakness 03/16/2020  . Weight loss 03/16/2020  . Gait instability 03/16/2020  . Dysthymia 03/16/2020  . Chronic pain of right knee 06/15/2019  . Unilateral primary osteoarthritis, right knee 06/15/2019  . Chronic pain of left knee 05/12/2018  . Unilateral primary osteoarthritis, left knee 05/12/2018  . Essential hypertension 11/16/2015  . Hx of adenomatous colonic polyps 09/27/2014      SLaureen Abrahams PT, DPT 03/21/20 3:21 PM    [facility] [address] [address]Phone: [phone]  Fax:  [phone] Name: VAdella ManolisMRN: [id number] of Birth: [birth year]    PHYSICAL THERAPY DISCHARGE SUMMARY  Visits from Start of Care: 10  Current functional level related to goals / functional outcomes: See above   Remaining deficits: See above; pt also with significant change in medical status   Education / Equipment: HEP  Plan: Patient agrees to discharge.  Patient goals were not met. Patient is being discharged due to a change in medical status.  ?????     SLaureen Abrahams PT, DPT 06/07/20 10:51 AM  [facility] [address] [place] [address]Phone: [phone]  Fax:  [phone]

## 2020-03-21 NOTE — Progress Notes (Signed)
The patient is very well-known to me.  She is only 74 years old but very slow to mobilize.  That is been her only complaint is how stiff and slow mobility she has.  She does have well-recognized bilateral knee osteoarthritis but no knee pain and also would not recommend any type of surgery.  She has been in outpatient physical therapy at this point to try to help with her mobility.  I have also recommended she see her primary care physician to see if there is anything metabolic that is causing her to slow down and to see if there is anything from an energy standpoint that they would recommend.  She does feel that therapy has helped some.  She still ambulates using a cane.  On exam both legs are weak but this is more of lack of effort I think.  There is good range of motion of both knees they are not really painful to her at all but her knees are certainly stiff.  From an orthopedic surgery standpoint there is no surgery that I recommend for her given the fact that her main complaint is not pain and just weakness.  I feel that if she continues physical therapy that will be helpful for her and she should follow-up with her primary care physician about energy levels as well.  From my standpoint, follow-up is as needed.

## 2020-03-22 ENCOUNTER — Ambulatory Visit: Payer: Medicare HMO | Admitting: Clinical

## 2020-03-22 ENCOUNTER — Other Ambulatory Visit (INDEPENDENT_AMBULATORY_CARE_PROVIDER_SITE_OTHER): Payer: Medicare HMO

## 2020-03-22 DIAGNOSIS — R2681 Unsteadiness on feet: Secondary | ICD-10-CM | POA: Diagnosis not present

## 2020-03-22 DIAGNOSIS — R634 Abnormal weight loss: Secondary | ICD-10-CM | POA: Diagnosis not present

## 2020-03-22 DIAGNOSIS — Z602 Problems related to living alone: Secondary | ICD-10-CM

## 2020-03-23 LAB — CBC WITH DIFFERENTIAL/PLATELET
Basophils Absolute: 0 10*3/uL (ref 0.0–0.2)
Basos: 1 %
EOS (ABSOLUTE): 0 10*3/uL (ref 0.0–0.4)
Eos: 1 %
Hematocrit: 39.4 % (ref 34.0–46.6)
Hemoglobin: 13.3 g/dL (ref 11.1–15.9)
Immature Grans (Abs): 0 10*3/uL (ref 0.0–0.1)
Immature Granulocytes: 0 %
Lymphocytes Absolute: 1.5 10*3/uL (ref 0.7–3.1)
Lymphs: 44 %
MCH: 29.2 pg (ref 26.6–33.0)
MCHC: 33.8 g/dL (ref 31.5–35.7)
MCV: 87 fL (ref 79–97)
Monocytes Absolute: 0.3 10*3/uL (ref 0.1–0.9)
Monocytes: 10 %
Neutrophils Absolute: 1.5 10*3/uL (ref 1.4–7.0)
Neutrophils: 44 %
Platelets: 196 10*3/uL (ref 150–450)
RBC: 4.55 x10E6/uL (ref 3.77–5.28)
RDW: 12.3 % (ref 11.7–15.4)
WBC: 3.5 10*3/uL (ref 3.4–10.8)

## 2020-03-23 LAB — TSH: TSH: 2.18 u[IU]/mL (ref 0.450–4.500)

## 2020-03-23 LAB — VITAMIN B12: Vitamin B-12: 257 pg/mL (ref 232–1245)

## 2020-03-23 LAB — FOLATE RBC
Folate, Hemolysate: 218 ng/mL
Folate, RBC: 553 ng/mL (ref >498)

## 2020-03-23 NOTE — Progress Notes (Signed)
Integrated Behavioral Health Comprehensive Clinical Assessment  MRN: [id number] Name: Diane Snow  Session Time: 3:15-4:00pm Total time: 45   Type of Service: Integrated Behavioral Health-Individual Interpretor: No. Interpretor Name and Language: NA  Presenting problem:  Patient is a 74 y/o [place] American female who has presented as a referral from pcp at clinic due to [place] pandemic it has impacted patient as she used to go out. Patient shared she thought "why not" give this an opportunity.  LCSW completed comprehensive clinical assessment to assess symptoms.  Social History:  Who lives in your current household? Patient lives alone. "all my life, I do get visits from cousins and friends not much."   How do describe your family relationships? "not many, have a sister in law, no children never married."  What is your family of origin(born, grow up), childhood history? "Born in [place] finished high school and came to live with my uncle here in [place]"   What are your social supports? family What are your hobbies? "busy-going someplace's, president of the neighborhood used to do once a month meeting but because of pandemic, no more. Now I am satisfied with home watch tv and movies."  Do you have any spiritual beliefs? "involved with [employer] pretty active- used to be in choir and involved with the community."   Education:  What is your highest level of education? high school diploma/GED.  Do you have history of developmental delays? 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 At first I missed it."  Legal History Do you have any history of legal charges? NA Current/Past charges?  Do you have any history of DSS investigations? NA   Medical History:   has a past medical history of Allergy, Arthritis, adenomatous colonic polyps (09/27/2014), and  Hypertension. Primary Care Physician: Dr. Seward Carol at [facility] [place]- came on 03/08/2020 for a second opinion. Date of last physical exam: 03/08/2020  Allergies:  Allergies  Allergen Reactions  . Aspirin Other (See Comments)    More than an 81 mg asa makes pt gag   Current medications:  Outpatient Encounter Medications as of 03/22/2020  Medication Sig  . amLODipine (NORVASC) 5 MG tablet Take 5 mg by mouth daily.  Marland Kitchen aspirin 81 MG tablet Take 81 mg by mouth daily.  . calcium carbonate (TUMS - DOSED IN MG ELEMENTAL CALCIUM) 500 MG chewable tablet Chew 1 tablet by mouth daily.  . calcium citrate-vitamin D (CITRACAL+D) 315-200 MG-UNIT tablet Take 1 tablet by mouth daily.  . diclofenac sodium (VOLTAREN) 1 % GEL Apply 4 g topically 4 (four) times daily.  . fexofenadine (ALLEGRA) 180 MG tablet Take 180 mg by mouth daily.  . furosemide (LASIX) 20 MG tablet Take 20 mg by mouth daily.  . potassium chloride (K-DUR,KLOR-CON) 10 MEQ tablet Take 10 mEq by mouth daily.   No facility-administered encounter medications on file as of 03/22/2020.   Have you ever had any serious medication reactions? No Is there any history of mental health problems or substance abuse in your family? No Has anyone in your family been hospitalized for mental health treatment? No  Psychiatric History (mental health or substance abuse)  Have you ever been treated for a mental health problem? NA If "Yes", when were you treated and whom did you see? NA Have you ever been hospitalized for mental health treatment? Negative History of Electroconvulsive Shock Therapy: No  Mental  Status:  General appearance/Behavior: Casual Eye contact: Fair; LCSW noted she was closing her eyes; patient shared she was just sleepy.  Motor behavior: Normal Speech: Normal and Volume:low soft tone Level of consciousness: Alert and Drowsy Mood: NA Affect: LCSW did notice a low affect. LCSW staffed with MD who saw patient and also noticed  a flat affect and animated affected when she smiled.  Anxiety level: NA Thought process: Coherent Thought content: WNL Perception: Normal Judgment: Fair Insight: Present   It should be noted patient shared she didn't remember meeting LCSW on date of exam with Provider Jacqualine Code, MD. LCSW will complete a mini mental status exam on next visit. Patient has slow activity due to stiffness of her knee.   Sleep Usual bedtime is 11pm-1am "stay up watch the 11pm news then the [facility] show, then I get sleepy and fall asleep. Sometimes I wake up at night."  Sleeping arrangements: self Problems with snoring: Not known Obstructive sleep apnea is not a concern. Problems with nightmares: No Problems with night terrors: No Problems with sleepwalking: No  Substance Abuse:  Do you use alcohol, nicotine or caffeine? "every now and then 1 beer, no cigarette use, caffeine 1-2 cups in the morning decaf."  Have you ever used illicit drugs or abused prescription medications? NA If yes? Substance Type-NA Route- NA Age of first use? NA Amount of Use? NA Frequency? NA Last use? NA Do you have any problems with the following symptoms? NA Reason for use, any motivation to stop, what is stopping from use ? NA  Risk Assessment: Within the last month thoughts of suicide? no - Patient denied any active SI/HI Thought and a plan? no - Patient denied any active SI/HI Intent? no - patient declined Do you have any history of suicide attempts? no - patient decline Do you have any protective factors that keep you from attempting? NA Patient decline any active SI/HI  Trauma History: Have you ever experienced or been exposed to any form of abuse?  Emotional? No Physical? No Sexual/assault?No Neglect?No Have you ever experienced or been exposed to something traumatic? No  Do you have any current symptoms? NA  Diagnosis Z60.2 Problem related to living alone (especially with COVID-19 pandemic patient  identified self isolating at home and used to be active in her community.   R/o unspecified depressive disorder and unspecified neurological disorder.   Patient outcome?  What do you want out of treatment?  Patient has difficulty identifying her goal for treatment and decided her goal is to "be happy and feel good" as people (loved ones) are concerned for her physical health.  GOALS ADDRESSED:   INTERVENTIONS: Interventions utilized:  none at this time due to only completing CCA on this date.  Standardized Assessments completed: GAD7(0) PHQ9 (5)   PLAN:  Based on screeners, presenting symptoms and diagnosis social worker is recommending therapy.  Scheduled next visit: 03/05/20 3PM.   Thomos Lemons P [facility] Clinical Social Work

## 2020-03-24 NOTE — Addendum Note (Signed)
Addended by: Marcelino Duster on: 03/24/2020 09:48 AM   Modules accepted: Level of Service

## 2020-03-24 NOTE — Progress Notes (Signed)
Physician note:  After reviewing what labs and patient's Epic chart, requested patient be seen for quick visit with me during lab visit.  Concerned she may be suffering from something more global such as Parkinson's Disease:    Patient states she has not been told she does not show much facial expression over the year.   She does believe even without her right knee issues that she has a bit of a shuffling gait and feels she will fall over more easily.  Perhaps even falling forward. She has developed a bit of a tremor in her left hand only--not sure, but describes mainly as a resting tremor.   She does feel she moves more slowly than she did a few years ago.  She gave consent for me to speak again with her cousin and friend, Diane Snow.  In the years I have known Diane Snow, she seems to have decreased facial expression and possibly bradykinesia.  Ms. Diane Snow states she does believe over the years that this may be the case--maybe 10 years ago moved more quickly and showed more facial expression. Again, she is concerned Diane Snow is having difficulties with memory as well.  On exam:  Possible masked facies, bradykinesia with speech and other motion.  Having her walk more significantly outside, she does tend to lean forward and shuffle with her feet beyond what would expect with her knee--not really a limping, but a shuffling.   I do not note a tremor, but she may have some mild cogwheeling of her Left arm.   Not noted in right arm.    Discussed above concerns with [name].  Discussed I could speak with her physician, Dr. Delfina Redwood and ask if he would be willing to have her be seen by Neurology or could refer her directly if she so chooses.    Await results of today's labs:  CBC, B12, RBC folate.

## 2020-03-25 DIAGNOSIS — H5202 Hypermetropia, left eye: Secondary | ICD-10-CM | POA: Diagnosis not present

## 2020-03-29 ENCOUNTER — Telehealth: Payer: Self-pay | Admitting: Physical Therapy

## 2020-03-29 ENCOUNTER — Encounter: Payer: Medicare HMO | Admitting: Physical Therapy

## 2020-03-29 NOTE — Telephone Encounter (Signed)
Pt no show for PT appointment today. They were contacted and informed of this via voicemail. They do not have any additional PT appointments set up so they were given our number to call back to set up future appointments if she feels she wants to.   Elsie Ra, PT, DPT 03/29/20 2:16 PM

## 2020-03-31 ENCOUNTER — Other Ambulatory Visit: Payer: Self-pay | Admitting: Internal Medicine

## 2020-03-31 DIAGNOSIS — G20 Parkinson's disease: Secondary | ICD-10-CM

## 2020-03-31 NOTE — Progress Notes (Signed)
Patient called beginning of this week and has asked [place] to proceed with referral to Neurology.  Concerned she has signs and symptoms of Parkinson's Disease/Parkinson like disease:  See note from repeat evaluation associated with lab visit on 03/22/2020:   Some of the findings I have noted for all of the 6-7 years that I have known Diane Snow, so hard to say if different from a previous baseline.  Her friend and cousin, Diane Snow, has stated she feels she noted some of these changes dating back 10 years or more:   Masked facies She is able to look upward with EOM. Bradykinesia Mild cogwheeling of LUE.   She complains of what sound like a resting tremor in LUE, but not seen during either exam Shuffling gait with tendency to lean forward and instability No arm swing with gait. Have not had the chance to have her return yet for MMSE.

## 2020-04-01 ENCOUNTER — Other Ambulatory Visit: Payer: Self-pay

## 2020-04-05 ENCOUNTER — Ambulatory Visit (INDEPENDENT_AMBULATORY_CARE_PROVIDER_SITE_OTHER): Payer: Self-pay | Admitting: Clinical

## 2020-04-05 DIAGNOSIS — Z602 Problems related to living alone: Secondary | ICD-10-CM

## 2020-04-06 NOTE — Progress Notes (Signed)
   THERAPY PROGRESS NOTE  Session Time: 3:00pm-3:45pm Participation Level: Active Behavioral Response: CasualDrowsycooperative Type of Therapy: Individual Therapy Treatment Goals addressed: Coping "to be happy and feel good."    Purpose: LCSW met with client for routine individual therapy to work towards treatment goals:Coping "to be happy and feel good."   Intervention: LCSW  assessed for any significant events and assessed how they are doing today since last seen. LCSW provided reflective listening as patient processed recent birthday celebration and how she is feeling physically. LCSW completed a mini mental status exam to assess orientation further from CCA. LCSW discussed with patient importance of continuing her physical and mental exercises. LCSW utilized intervention of psychosocial skills utilizing handout from [employer] "Tips for Survivors of a Pandemic:Managing Stress." LCSW utilized this handout to facilitate discussion to discuss further make and use your connections, finding purpose, having a flexible routine, managing thoughts, exercise, get outside, engaging in practices to relax, news intake, sense of humor, physical care, writing in a stress journal, and celebrating success.   LCSW assessed for SI/HI/command psychosis.  Effectiveness: Patient is alert x3 affect. Patient identified she is feeling great and blessed as she just had a birthday. Patient felt thankful her cousin came to visit her and received phone call from loved ones. Patient shared she has had a good and bad days but no falls since last seen. Patient shared update regarding medical with an upcoming appointment with neurologist and is hopeful she gets some answers. Patient shared physical therapy is on pause as her doctor wants her to be seen by a neurologist first. Patient reports a little bit of her own exercises and is continuing to take her Vitamin B12 and feels a small change so far since she started taking them.     Patient participated in session sharing some examples of each strategy she is doing and her perspective. Patient shared she goes on car rides around town. Patient shared with church opening up having the connection of seeing her church members she was excited the morning off. It should be noted during session patient began to nod off but she catched herself and noted not sure why she was drowsy. Intervention was effective as patient was able to reflect with the article points. Progress towards goal is Ongoing. Patient denied active suicidal/homicidal/active psychosis.  Plan Patient offered next appointment for: 07/06 3pm as a check in   Diagnosis: Problems related to living alone r/o unspecified neurocognitive disorder.   MMSE score 25/26 questions completed LCSW did not do 2 instructions during this session.   Lujean Rave, LCSW 04/06/2020

## 2020-04-12 ENCOUNTER — Encounter: Payer: Self-pay | Admitting: Internal Medicine

## 2020-04-12 ENCOUNTER — Ambulatory Visit (INDEPENDENT_AMBULATORY_CARE_PROVIDER_SITE_OTHER): Payer: Medicare HMO | Admitting: Internal Medicine

## 2020-04-12 VITALS — BP 122/74 | HR 78 | Resp 12 | Ht <= 58 in | Wt 138.0 lb

## 2020-04-12 DIAGNOSIS — I10 Essential (primary) hypertension: Secondary | ICD-10-CM

## 2020-04-12 DIAGNOSIS — R609 Edema, unspecified: Secondary | ICD-10-CM | POA: Diagnosis not present

## 2020-04-12 DIAGNOSIS — M7072 Other bursitis of hip, left hip: Secondary | ICD-10-CM | POA: Diagnosis not present

## 2020-04-12 MED ORDER — VITAMIN B-12 1000 MCG PO TABS: 1000.0000 ug | ORAL_TABLET | Freq: Every day | ORAL | Status: AC

## 2020-04-12 MED ORDER — METOPROLOL SUCCINATE ER 50 MG PO TB24: 50.0000 mg | ORAL_TABLET | Freq: Every day | ORAL | 11 refills | Status: DC

## 2020-04-12 NOTE — Patient Instructions (Signed)
Stop Amlodipine  Use Diclofenac gel up to 4 times a day on your rumpus if you need it to keep pain down  Ok to take Tylenol arthritis once daily.

## 2020-04-12 NOTE — Progress Notes (Signed)
    Subjective:    Patient ID: Diane Snow, female   DOB: 1946-08-13, 74 y.o.   MRN: [id number]   HPI   1.  Left low back and thigh and radiating around to ASIS range started about 1 week ago.  Does not recall injuring herself in any way.    2.  Leg swelling.  Furosemide does not seem to be helping.  Not clear how long she has been taking Furosemide, but for a time.  She has been having problems for a number of years.  Possibly since started Amlodipine.  Describes being on a medicine prior that caused tongue swelling.  Does not recall the names of the ACE I or ARBs.  Unable to find in Epic chart, but she states she went to the ED for this at [facility] when it occurred.  We do not have a baseline BUN or Crea on her in this chart.  Have not yet received her records from Dr. Lina Sar records.    Current Meds  Medication Sig  . amLODipine (NORVASC) 5 MG tablet Take 5 mg by mouth daily.  Marland Kitchen aspirin 81 MG tablet Take 81 mg by mouth daily.  . calcium carbonate (TUMS - DOSED IN MG ELEMENTAL CALCIUM) 500 MG chewable tablet Chew 1 tablet by mouth daily.  . calcium citrate-vitamin D (CITRACAL+D) 315-200 MG-UNIT tablet Take 1 tablet by mouth daily.  . diclofenac sodium (VOLTAREN) 1 % GEL Apply 4 g topically 4 (four) times daily.  . fexofenadine (ALLEGRA) 180 MG tablet Take 180 mg by mouth daily.  . furosemide (LASIX) 20 MG tablet Take 20 mg by mouth daily.  . potassium chloride (K-DUR,KLOR-CON) 10 MEQ tablet Take 10 mEq by mouth daily.  Also taking Vitamin B12 1000 mcg daily.     Allergies  Allergen Reactions  . Aspirin Other (See Comments)    More than an 81 mg asa makes pt gag     Review of Systems    Objective:   BP 122/74 (BP Location: Left Arm, Patient Position: Sitting, Cuff Size: Normal)   Pulse 78   Resp 12   Ht 4\' 10"  (1.473 m)   Wt 138 lb (62.6 kg)   BMI 28.84 kg/m   Physical Exam NAD, bradykinesia and shuffling gait Neck:  No JVD Lungs:  CTA CV:  RRR without murmur or  rub.  Radial pulses normal and equal.  Palpable and equal DP pulses.  Pitting edems to lower legs from feet. NT over spinous processes.  Tender over what seems to be her ischial tuberosity that reproduces the pain  Assessment & Plan   1.  Left ischial bursitis:  Diclofenac gel up to 4 times daily to area.  May continue Tylenol arthritis once daily.  2.  LE edema:  Not clear how long she has been taking Furosmide, but edema could be from the Amlodipine based on her history.  Stop Amlodipine.  Metoprolol XL 50 mg daily.  Follow up next week for bp and pulse check  To bring her home monitor for comparison.  CMP  3.  Hypertension:  As above.  Hopefully, her edema of feet and ankles will recede with stop of Amlodipine and will be able to stop Furosemide as well.  4.  Low normal B12 level:  Has started 1000 mcg of Vitamin B12--continue.

## 2020-04-13 LAB — COMPREHENSIVE METABOLIC PANEL
ALT: 7 IU/L (ref 0–32)
AST: 20 IU/L (ref 0–40)
Albumin/Globulin Ratio: 1.2 (ref 1.2–2.2)
Albumin: 4 g/dL (ref 3.7–4.7)
Alkaline Phosphatase: 73 IU/L (ref 48–121)
BUN/Creatinine Ratio: 17 (ref 12–28)
BUN: 15 mg/dL (ref 8–27)
Bilirubin Total: 0.4 mg/dL (ref 0.0–1.2)
CO2: 23 mmol/L (ref 20–29)
Calcium: 9.3 mg/dL (ref 8.7–10.3)
Chloride: 101 mmol/L (ref 96–106)
Creatinine, Ser: 0.89 mg/dL (ref 0.57–1.00)
GFR calc Af Amer: 74 mL/min/{1.73_m2} (ref >59)
GFR calc non Af Amer: 64 mL/min/{1.73_m2} (ref >59)
Globulin, Total: 3.3 g/dL (ref 1.5–4.5)
Glucose: 83 mg/dL (ref 65–99)
Potassium: 3.8 mmol/L (ref 3.5–5.2)
Sodium: 138 mmol/L (ref 134–144)
Total Protein: 7.3 g/dL (ref 6.0–8.5)

## 2020-04-15 ENCOUNTER — Ambulatory Visit: Payer: Medicare HMO

## 2020-04-17 ENCOUNTER — Emergency Department (HOSPITAL_COMMUNITY): Payer: Medicare HMO

## 2020-04-17 ENCOUNTER — Inpatient Hospital Stay (HOSPITAL_COMMUNITY)
Admission: EM | Admit: 2020-04-17 | Discharge: 2020-04-23 | DRG: 683 | Disposition: A | Discharge status: Skilled Nursing Facility | Payer: Medicare HMO | Attending: Family Medicine | Admitting: Family Medicine

## 2020-04-17 ENCOUNTER — Other Ambulatory Visit: Payer: Self-pay

## 2020-04-17 ENCOUNTER — Encounter (HOSPITAL_COMMUNITY): Payer: Self-pay | Admitting: Emergency Medicine

## 2020-04-17 DIAGNOSIS — R2689 Other abnormalities of gait and mobility: Secondary | ICD-10-CM | POA: Diagnosis not present

## 2020-04-17 DIAGNOSIS — Z6827 Body mass index (BMI) 27.0-27.9, adult: Secondary | ICD-10-CM | POA: Diagnosis not present

## 2020-04-17 DIAGNOSIS — Z841 Family history of disorders of kidney and ureter: Secondary | ICD-10-CM | POA: Diagnosis not present

## 2020-04-17 DIAGNOSIS — M6281 Muscle weakness (generalized): Secondary | ICD-10-CM | POA: Diagnosis present

## 2020-04-17 DIAGNOSIS — Z8249 Family history of ischemic heart disease and other diseases of the circulatory system: Secondary | ICD-10-CM | POA: Diagnosis not present

## 2020-04-17 DIAGNOSIS — Z79899 Other long term (current) drug therapy: Secondary | ICD-10-CM

## 2020-04-17 DIAGNOSIS — L89322 Pressure ulcer of left buttock, stage 2: Secondary | ICD-10-CM | POA: Diagnosis not present

## 2020-04-17 DIAGNOSIS — J9811 Atelectasis: Secondary | ICD-10-CM | POA: Diagnosis not present

## 2020-04-17 DIAGNOSIS — G8220 Paraplegia, unspecified: Secondary | ICD-10-CM | POA: Diagnosis not present

## 2020-04-17 DIAGNOSIS — M79676 Pain in unspecified toe(s): Secondary | ICD-10-CM

## 2020-04-17 DIAGNOSIS — T796XXA Traumatic ischemia of muscle, initial encounter: Secondary | ICD-10-CM | POA: Diagnosis not present

## 2020-04-17 DIAGNOSIS — Z87891 Personal history of nicotine dependence: Secondary | ICD-10-CM | POA: Diagnosis not present

## 2020-04-17 DIAGNOSIS — I10 Essential (primary) hypertension: Secondary | ICD-10-CM | POA: Diagnosis present

## 2020-04-17 DIAGNOSIS — Z9181 History of falling: Secondary | ICD-10-CM | POA: Diagnosis not present

## 2020-04-17 DIAGNOSIS — E872 Acidosis: Secondary | ICD-10-CM | POA: Diagnosis not present

## 2020-04-17 DIAGNOSIS — L89896 Pressure-induced deep tissue damage of other site: Secondary | ICD-10-CM | POA: Diagnosis present

## 2020-04-17 DIAGNOSIS — S199XXA Unspecified injury of neck, initial encounter: Secondary | ICD-10-CM | POA: Diagnosis not present

## 2020-04-17 DIAGNOSIS — R7401 Elevation of levels of liver transaminase levels: Secondary | ICD-10-CM | POA: Diagnosis present

## 2020-04-17 DIAGNOSIS — M7989 Other specified soft tissue disorders: Secondary | ICD-10-CM | POA: Diagnosis not present

## 2020-04-17 DIAGNOSIS — R8281 Pyuria: Secondary | ICD-10-CM | POA: Diagnosis present

## 2020-04-17 DIAGNOSIS — R5381 Other malaise: Secondary | ICD-10-CM | POA: Diagnosis not present

## 2020-04-17 DIAGNOSIS — E860 Dehydration: Secondary | ICD-10-CM | POA: Diagnosis not present

## 2020-04-17 DIAGNOSIS — M6282 Rhabdomyolysis: Secondary | ICD-10-CM | POA: Diagnosis present

## 2020-04-17 DIAGNOSIS — D751 Secondary polycythemia: Secondary | ICD-10-CM | POA: Diagnosis present

## 2020-04-17 DIAGNOSIS — G20 Parkinson's disease: Secondary | ICD-10-CM | POA: Diagnosis present

## 2020-04-17 DIAGNOSIS — N390 Urinary tract infection, site not specified: Secondary | ICD-10-CM | POA: Diagnosis not present

## 2020-04-17 DIAGNOSIS — R296 Repeated falls: Secondary | ICD-10-CM | POA: Diagnosis present

## 2020-04-17 DIAGNOSIS — R262 Difficulty in walking, not elsewhere classified: Secondary | ICD-10-CM

## 2020-04-17 DIAGNOSIS — R531 Weakness: Secondary | ICD-10-CM

## 2020-04-17 DIAGNOSIS — N179 Acute kidney failure, unspecified: Secondary | ICD-10-CM | POA: Diagnosis not present

## 2020-04-17 DIAGNOSIS — M5106 Intervertebral disc disorders with myelopathy, lumbar region: Secondary | ICD-10-CM | POA: Diagnosis not present

## 2020-04-17 DIAGNOSIS — I639 Cerebral infarction, unspecified: Secondary | ICD-10-CM | POA: Diagnosis not present

## 2020-04-17 DIAGNOSIS — Z7982 Long term (current) use of aspirin: Secondary | ICD-10-CM

## 2020-04-17 DIAGNOSIS — R41841 Cognitive communication deficit: Secondary | ICD-10-CM | POA: Diagnosis not present

## 2020-04-17 DIAGNOSIS — E46 Unspecified protein-calorie malnutrition: Secondary | ICD-10-CM | POA: Diagnosis not present

## 2020-04-17 DIAGNOSIS — R600 Localized edema: Secondary | ICD-10-CM | POA: Diagnosis not present

## 2020-04-17 DIAGNOSIS — M79675 Pain in left toe(s): Secondary | ICD-10-CM | POA: Diagnosis not present

## 2020-04-17 DIAGNOSIS — R2681 Unsteadiness on feet: Secondary | ICD-10-CM | POA: Diagnosis not present

## 2020-04-17 DIAGNOSIS — R0902 Hypoxemia: Secondary | ICD-10-CM | POA: Diagnosis not present

## 2020-04-17 DIAGNOSIS — H109 Unspecified conjunctivitis: Secondary | ICD-10-CM

## 2020-04-17 DIAGNOSIS — Z20822 Contact with and (suspected) exposure to covid-19: Secondary | ICD-10-CM | POA: Diagnosis present

## 2020-04-17 DIAGNOSIS — M1712 Unilateral primary osteoarthritis, left knee: Secondary | ICD-10-CM | POA: Diagnosis not present

## 2020-04-17 DIAGNOSIS — M1711 Unilateral primary osteoarthritis, right knee: Secondary | ICD-10-CM | POA: Diagnosis not present

## 2020-04-17 DIAGNOSIS — M5104 Intervertebral disc disorders with myelopathy, thoracic region: Secondary | ICD-10-CM | POA: Diagnosis not present

## 2020-04-17 DIAGNOSIS — I700 Atherosclerosis of aorta: Secondary | ICD-10-CM | POA: Diagnosis not present

## 2020-04-17 DIAGNOSIS — Z7401 Bed confinement status: Secondary | ICD-10-CM | POA: Diagnosis not present

## 2020-04-17 DIAGNOSIS — R519 Headache, unspecified: Secondary | ICD-10-CM | POA: Diagnosis not present

## 2020-04-17 DIAGNOSIS — L8990 Pressure ulcer of unspecified site, unspecified stage: Secondary | ICD-10-CM | POA: Insufficient documentation

## 2020-04-17 DIAGNOSIS — M4716 Other spondylosis with myelopathy, lumbar region: Secondary | ICD-10-CM | POA: Diagnosis not present

## 2020-04-17 DIAGNOSIS — M2550 Pain in unspecified joint: Secondary | ICD-10-CM | POA: Diagnosis not present

## 2020-04-17 LAB — CBC
HCT: 50.6 % — ABNORMAL HIGH (ref 36.0–46.0)
Hemoglobin: 16.5 g/dL — ABNORMAL HIGH (ref 12.0–15.0)
MCH: 29.6 pg (ref 26.0–34.0)
MCHC: 32.6 g/dL (ref 30.0–36.0)
MCV: 90.8 fL (ref 80.0–100.0)
Platelets: 203 10*3/uL (ref 150–400)
RBC: 5.57 MIL/uL — ABNORMAL HIGH (ref 3.87–5.11)
RDW: 13.6 % (ref 11.5–15.5)
WBC: 10.6 10*3/uL — ABNORMAL HIGH (ref 4.0–10.5)
nRBC: 0 % (ref 0.0–0.2)

## 2020-04-17 LAB — COMPREHENSIVE METABOLIC PANEL
ALT: 35 U/L (ref 0–44)
AST: 156 U/L — ABNORMAL HIGH (ref 15–41)
Albumin: 3.9 g/dL (ref 3.5–5.0)
Alkaline Phosphatase: 70 U/L (ref 38–126)
Anion gap: 16 — ABNORMAL HIGH (ref 5–15)
BUN: 29 mg/dL — ABNORMAL HIGH (ref 8–23)
CO2: 23 mmol/L (ref 22–32)
Calcium: 9.4 mg/dL (ref 8.9–10.3)
Chloride: 105 mmol/L (ref 98–111)
Creatinine, Ser: 1.33 mg/dL — ABNORMAL HIGH (ref 0.44–1.00)
GFR calc Af Amer: 46 mL/min — ABNORMAL LOW (ref >60)
GFR calc non Af Amer: 39 mL/min — ABNORMAL LOW (ref >60)
Glucose, Bld: 146 mg/dL — ABNORMAL HIGH (ref 70–99)
Potassium: 4.1 mmol/L (ref 3.5–5.1)
Sodium: 144 mmol/L (ref 135–145)
Total Bilirubin: 0.9 mg/dL (ref 0.3–1.2)
Total Protein: 8.6 g/dL — ABNORMAL HIGH (ref 6.5–8.1)

## 2020-04-17 LAB — BRAIN NATRIURETIC PEPTIDE: B Natriuretic Peptide: 171 pg/mL — ABNORMAL HIGH (ref 0.0–100.0)

## 2020-04-17 MED ORDER — TOBRAMYCIN 0.3 % OP SOLN
2.0000 [drp] | Freq: Once | OPHTHALMIC | Status: AC
  Administered 2020-04-17: 2 [drp] via OPHTHALMIC
  Filled 2020-04-17: qty 5

## 2020-04-17 MED ORDER — SODIUM CHLORIDE 0.9 % IV BOLUS
1000.0000 mL | Freq: Once | INTRAVENOUS | Status: AC
  Administered 2020-04-17: 1000 mL via INTRAVENOUS

## 2020-04-17 NOTE — ED Provider Notes (Addendum)
[name] DEPT Provider Note   CSN: [id number] Arrival date & time: 04/17/20  1939     History Chief Complaint  Patient presents with  . Weakness    Diane Snow is a 74 y.o. female.  Patient with generalized weakness in the past few days. Symptoms gradual onset, moderate, constant, persistent, worse today. Pt denies any focal or unilateral numbness or weakness. Denies trauma or fall. Denies fever or chills. No headache. No chest pain or sob. No abd pain or nvd. No dysuria or gu c/o, although family noted to ems a hx uti. Pt denies recent change in meds or new meds.   The history is provided by the patient and the EMS personnel.  Weakness Associated symptoms: no abdominal pain, no chest pain, no cough, no diarrhea, no dysuria, no fever, no headaches, no shortness of breath and no vomiting        Past Medical History:  Diagnosis Date  . Allergy   . Arthritis   . Hx of adenomatous colonic polyps 09/27/2014  . Hypertension     Patient Active Problem List   Diagnosis Date Noted  . Left-sided weakness 03/16/2020  . Weight loss 03/16/2020  . Gait instability 03/16/2020  . Dysthymia 03/16/2020  . Chronic pain of right knee 06/15/2019  . Unilateral primary osteoarthritis, right knee 06/15/2019  . Chronic pain of left knee 05/12/2018  . Unilateral primary osteoarthritis, left knee 05/12/2018  . Essential hypertension 11/16/2015  . Hx of adenomatous colonic polyps 09/27/2014    Past Surgical History:  Procedure Laterality Date  . BREAST BIOPSY Left 1976  . BREAST EXCISIONAL BIOPSY Left   . COLONOSCOPY    . PARTIAL HYSTERECTOMY     fibroid tumors     OB History   No obstetric history on file.     Family History  Problem Relation Age of Onset  . Kidney disease Mother        cause of death  . Hypertension Mother   . Heart disease Father        CHF cause of death  . Hypertension Father   . Breast cancer Maternal Aunt   .  Heart disease Brother        CHF cause of death  . Gout Brother   . Colon cancer Neg Hx   . Rectal cancer Neg Hx   . Stomach cancer Neg Hx   . Esophageal cancer Neg Hx   . Pancreatic cancer Neg Hx     Social History   Tobacco Use  . Smoking status: Former Smoker    Packs/day: 1.00    Years: 20.00    Pack years: 20.00    Types: Cigarettes    Quit date: 03/30/2011    Years since quitting: 9.0  . Smokeless tobacco: Never Used  Vaping Use  . Vaping Use: Never used  Substance Use Topics  . Alcohol use: Yes    Alcohol/week: 0.0 standard drinks    Comment: may drink a beer or wine at night.  . Drug use: No    Home Medications Prior to Admission medications   Medication Sig Start Date End Date Taking? Authorizing Provider  aspirin 81 MG tablet Take 81 mg by mouth daily.    [provider]  calcium carbonate (TUMS - DOSED IN MG ELEMENTAL CALCIUM) 500 MG chewable tablet Chew 1 tablet by mouth daily.    [provider]  calcium citrate-vitamin D (CITRACAL+D) 315-200 MG-UNIT tablet Take 1 tablet  by mouth daily.    [provider]  diclofenac sodium (VOLTAREN) 1 % GEL Apply 4 g topically 4 (four) times daily. 02/11/19   Pete Pelt, PA-C  fexofenadine (ALLEGRA) 180 MG tablet Take 180 mg by mouth daily.    [provider]  furosemide (LASIX) 20 MG tablet Take 20 mg by mouth daily. 12/30/19   [provider]  metoprolol succinate (TOPROL-XL) 50 MG 24 hr tablet Take 1 tablet (50 mg total) by mouth daily. Take with or immediately following a meal. 04/12/20   Mack Hook, MD  potassium chloride (K-DUR,KLOR-CON) 10 MEQ tablet Take 10 mEq by mouth daily. 04/29/18   [provider]  vitamin B-12 (CYANOCOBALAMIN) 1000 MCG tablet Take 1 tablet (1,000 mcg total) by mouth daily. 04/12/20   Mack Hook, MD    Allergies    Aspirin  Review of Systems   Review of Systems  Constitutional: Negative for chills and fever.  HENT:  Negative for sore throat.   Eyes: Negative for pain and visual disturbance.  Respiratory: Negative for cough and shortness of breath.   Cardiovascular: Negative for chest pain.  Gastrointestinal: Negative for abdominal pain, blood in stool, diarrhea and vomiting.  Genitourinary: Negative for dysuria and flank pain.  Musculoskeletal: Negative for back pain and neck pain.  Skin: Negative for rash.  Neurological: Positive for weakness. Negative for numbness and headaches.  Hematological: Does not bruise/bleed easily.  Psychiatric/Behavioral: Negative for confusion.    Physical Exam Updated Vital Signs There were no vitals taken for this visit.  Physical Exam Vitals and nursing note reviewed.  Constitutional:      Appearance: Normal appearance. She is well-developed.  HENT:     Head: Atraumatic.     Nose: Nose normal.     Mouth/Throat:     Mouth: Mucous membranes are moist.  Eyes:     General: No scleral icterus.       Right eye: Discharge present.        Left eye: Discharge present.    Pupils: Pupils are equal, round, and reactive to light.     Comments: bil conjuncitivits (pt indicates possible 'allergies', however there is thick, crusty matting on lashes). No orbital or periorbital cellulitis.   Neck:     Trachea: No tracheal deviation.     Comments: No stiffness or rigidity. No bruits.  Cardiovascular:     Rate and Rhythm: Normal rate and regular rhythm.     Pulses: Normal pulses.     Heart sounds: Normal heart sounds. No murmur heard.  No friction rub. No gallop.   Pulmonary:     Effort: Pulmonary effort is normal. No respiratory distress.     Breath sounds: Normal breath sounds.  Abdominal:     General: Bowel sounds are normal. There is no distension.     Palpations: Abdomen is soft.     Tenderness: There is no abdominal tenderness. There is no guarding.  Genitourinary:    Comments: No cva tenderness.  Musculoskeletal:     Cervical back: Normal range of motion and  neck supple. No rigidity. No muscular tenderness.     Comments: Symmetric bil foot, ankle, and lower leg edema.   Skin:    General: Skin is warm and dry.     Findings: No rash.  Neurological:     Mental Status: She is alert.     Comments: Alert, speech normal. Motor intact bil, stre 5/5. sens grossly intact bil.   Psychiatric:  Mood and Affect: Mood normal.     ED Results / Procedures / Treatments   Labs (all labs ordered are listed, but only abnormal results are displayed) Results for orders placed or performed during the hospital encounter of 04/17/20  CBC  Result Value Ref Range   WBC 10.6 (H) 4.0 - 10.5 K/uL   RBC 5.57 (H) 3.87 - 5.11 MIL/uL   Hemoglobin 16.5 (H) 12.0 - 15.0 g/dL   HCT 50.6 (H) 36 - 46 %   MCV 90.8 80.0 - 100.0 fL   MCH 29.6 26.0 - 34.0 pg   MCHC 32.6 30.0 - 36.0 g/dL   RDW 13.6 11.5 - 15.5 %   Platelets 203 150 - 400 K/uL   nRBC 0.0 0.0 - 0.2 %  Comprehensive metabolic panel  Result Value Ref Range   Sodium 144 135 - 145 mmol/L   Potassium 4.1 3.5 - 5.1 mmol/L   Chloride 105 98 - 111 mmol/L   CO2 23 22 - 32 mmol/L   Glucose, Bld 146 (H) 70 - 99 mg/dL   BUN 29 (H) 8 - 23 mg/dL   Creatinine, Ser 1.33 (H) 0.44 - 1.00 mg/dL   Calcium 9.4 8.9 - 10.3 mg/dL   Total Protein 8.6 (H) 6.5 - 8.1 g/dL   Albumin 3.9 3.5 - 5.0 g/dL   AST 156 (H) 15 - 41 U/L   ALT 35 0 - 44 U/L   Alkaline Phosphatase 70 38 - 126 U/L   Total Bilirubin 0.9 0.3 - 1.2 mg/dL   GFR calc non Af Amer 39 (L) >60 mL/min   GFR calc Af Amer 46 (L) >60 mL/min   Anion gap 16 (H) 5 - 15  Brain natriuretic peptide  Result Value Ref Range   B Natriuretic Peptide 171.0 (H) 0.0 - 100.0 pg/mL   CT CERVICAL SPINE WO CONTRAST  Result Date: 04/17/2020 CLINICAL DATA:  Neck trauma, blunt Patient reports generalized weakness. EXAM: CT CERVICAL SPINE WITHOUT CONTRAST TECHNIQUE: Multidetector CT imaging of the cervical spine was performed without intravenous contrast. Multiplanar CT image  reconstructions were also generated. COMPARISON:  Cervical spine CT 01/12/2020 FINDINGS: Alignment: Broad-based rightward curvature, not seen on prior. Minor retrolisthesis of C4 on C5, C5 on C6 and C7 on T1, unchanged. Skull base and vertebrae: No acute fracture. No primary bone lesion or focal pathologic process. Soft tissues and spinal canal: No prevertebral fluid or swelling. No visible canal hematoma. Disc levels: Stable degree of diffuse degenerative disc disease and facet hypertrophy. Right neural foraminal stenosis at C4-C5 is unchanged. Upper chest: No acute or unexpected findings. Other: Carotid calcifications. IMPRESSION: 1. No acute fracture or subluxation of the cervical spine. 2. Broad-based rightward curvature of the cervical spine is new from prior, may be positioning or related to muscle spasm. 3. Multilevel degenerative disc disease and facet hypertrophy is unchanged from prior. Electronically Signed   By: Keith Rake M.D.   On: 04/17/2020 21:48   DG Chest Port 1 View  Result Date: 04/17/2020 CLINICAL DATA:  Increased weakness x1 day. EXAM: PORTABLE CHEST 1 VIEW COMPARISON:  None. FINDINGS: There is no evidence of acute infiltrate, pleural effusion or pneumothorax. The heart size and mediastinal contours are within normal limits. There is mild calcification of the aortic arch. Multilevel degenerative changes seen throughout the thoracic spine. IMPRESSION: No active disease. Electronically Signed   By: Virgina Norfolk M.D.   On: 04/17/2020 20:44    EKG EKG Interpretation  Date/Time:  Sunday April 17 2020 20:28:33 EDT Ventricular Rate:  80 PR Interval:    QRS Duration: 84 QT Interval:  388 QTC Calculation: 448 R Axis:   43 Text Interpretation: Sinus rhythm Non-specific ST-t changes No previous tracing Confirmed by Lajean Saver [phone]) on 04/17/2020 8:31:24 PM   Radiology CT HEAD WO CONTRAST  Result Date: 04/17/2020 CLINICAL DATA:  Headache, post traumatic Patient reports  weakness. EXAM: CT HEAD WITHOUT CONTRAST TECHNIQUE: Contiguous axial images were obtained from the base of the skull through the vertex without intravenous contrast. COMPARISON:  Head CT 01/12/2020 FINDINGS: Brain: No intracranial hemorrhage, mass effect, or midline shift. Generalized atrophy and chronic small vessel ischemia, stable from prior. 3 No hydrocephalus. The basilar cisterns are patent. No evidence of territorial infarct or acute ischemia. No extra-axial or intracranial fluid collection. Vascular: Atherosclerosis of skullbase vasculature without hyperdense vessel or abnormal calcification. Skull: No fracture or focal lesion. Sinuses/Orbits: Mucosal thickening throughout ethmoid air cells and involving bilateral maxillary sinuses, chronic but slightly progressed from prior. No sinus fluid level. Mastoid air cells are clear. Other: Subcutaneous soft tissue edema involving the left side of the face. There is diffuse fluid in the scalp which may be subgaleal measuring up to 11 mm. IMPRESSION: 1. No acute intracranial abnormality. No skull fracture. 2. Stable atrophy and chronic small vessel ischemia. 3. Subcutaneous soft tissue edema involving the left side of the face. Diffuse fluid in the scalp, near circumferential, which may be subgaleal in location measuring up to 11 mm in thickness. This is of unknown etiology. Electronically Signed   By: Keith Rake M.D.   On: 04/17/2020 21:54   CT CERVICAL SPINE WO CONTRAST  Result Date: 04/17/2020 CLINICAL DATA:  Neck trauma, blunt Patient reports generalized weakness. EXAM: CT CERVICAL SPINE WITHOUT CONTRAST TECHNIQUE: Multidetector CT imaging of the cervical spine was performed without intravenous contrast. Multiplanar CT image reconstructions were also generated. COMPARISON:  Cervical spine CT 01/12/2020 FINDINGS: Alignment: Broad-based rightward curvature, not seen on prior. Minor retrolisthesis of C4 on C5, C5 on C6 and C7 on T1, unchanged. Skull base  and vertebrae: No acute fracture. No primary bone lesion or focal pathologic process. Soft tissues and spinal canal: No prevertebral fluid or swelling. No visible canal hematoma. Disc levels: Stable degree of diffuse degenerative disc disease and facet hypertrophy. Right neural foraminal stenosis at C4-C5 is unchanged. Upper chest: No acute or unexpected findings. Other: Carotid calcifications. IMPRESSION: 1. No acute fracture or subluxation of the cervical spine. 2. Broad-based rightward curvature of the cervical spine is new from prior, may be positioning or related to muscle spasm. 3. Multilevel degenerative disc disease and facet hypertrophy is unchanged from prior. Electronically Signed   By: Keith Rake M.D.   On: 04/17/2020 21:48   DG Chest Port 1 View  Result Date: 04/17/2020 CLINICAL DATA:  Increased weakness x1 day. EXAM: PORTABLE CHEST 1 VIEW COMPARISON:  None. FINDINGS: There is no evidence of acute infiltrate, pleural effusion or pneumothorax. The heart size and mediastinal contours are within normal limits. There is mild calcification of the aortic arch. Multilevel degenerative changes seen throughout the thoracic spine. IMPRESSION: No active disease. Electronically Signed   By: Virgina Norfolk M.D.   On: 04/17/2020 20:44    Procedures Procedures (including critical care time)  Medications Ordered in ED Medications - No data to display  ED Course  I have reviewed the triage vital signs and the nursing notes.  Pertinent labs & imaging results that were available during my care of  the patient were reviewed by me and considered in my medical decision making (see chart for details).    MDM Rules/Calculators/A&P                          Labs sent.   Reviewed nursing notes and prior charts for additional history.   CXR reviewed/intepreted by me - no pna.   Initial labs reviewed/interpreted by me - hct increased, bun/cr mildly increased compared to prior. ?mild volume  depletion/dehydratio - iv ns bolus.   Recheck pt, denies any new c/o or pain. Po fluids, food provided. Awaiting additional test results.   CTs reviewed/interpreted by me - no fx, no hem.   For gen weakness, deconditioning, will make home health referral.   2250, UA remains pending, hx uti. Signed out to Dr Florina Ou to check UA, recheck, and dispo appropriately. If uti, would strongly consider admission.  Family member indicates concerned as pt lives alone, and has slowly getting weaker, increased trouble getting around, etc. Will get toc consult.       Final Clinical Impression(s) / ED Diagnoses Final diagnoses:  None    Rx / DC Orders ED Discharge Orders    None           Lajean Saver, MD 04/17/20 2319

## 2020-04-17 NOTE — Discharge Instructions (Addendum)
Rhabdomyolysis °Rhabdomyolysis is a condition that happens when muscle cells break down and release substances into the blood that can damage the kidneys. This condition happens because of damage to the muscles that move bones (skeletal muscle). When the skeletal muscles are damaged, substances inside the muscle cells go into the blood. One of these substances is a protein called myoglobin. °Large amounts of myoglobin can cause kidney damage or kidney failure. Other substances that are released by muscle cells may upset the balance of the minerals (electrolytes) in your blood. This imbalance causes your blood to have too much acid (acidosis). °What are the causes? °This condition is caused by muscle damage. Muscle damage often happens because of: °· Using your muscles too much. °· An injury that crushes or squeezes a muscle too tightly. °· Using illegal drugs, mainly cocaine. °· Alcohol abuse. °Other possible causes include: °· Prescription medicines, such as those that: °? Lower cholesterol (statins). °? Treat ADHD (attention deficit hyperactivity disorder) or help with weight loss (amphetamines). °? Treat pain (opiates). °· Infections. °· Muscle diseases that are passed down from parent to child (inherited). °· High fever. °· Heatstroke. °· Not having enough fluids in your body (dehydration). °· Seizures. °· Surgery. °What increases the risk? °This condition is more likely to develop in people who: °· Have a family history of muscle disease. °· Take part in extreme sports, such as running in marathons. °· Have diabetes. °· Are older. °· Abuse drugs or alcohol. °What are the signs or symptoms? °Symptoms of this condition vary. Some people have very few symptoms, and other people have many symptoms. The most common symptoms include: °· Muscle pain and swelling. °· Weak muscles. °· Dark urine. °· Feeling weak and tired. °Other symptoms include: °· Nausea and vomiting. °· Fever. °· Pain in the abdomen. °· Pain in the  joints. °Symptoms of complications from this condition include: °· Heart rhythm that is not normal (arrhythmia). °· Seizures. °· Not urinating enough because of kidney failure. °· Very low blood pressure (shock). Signs of shock include dizziness, blurry vision, and clammy skin. °· Bleeding that is hard to stop or control. °How is this diagnosed? °This condition is diagnosed based on your medical history, your symptoms, and a physical exam. Tests may also be done, including: °· Blood tests. °· Urine tests to check for myoglobin. °You may also have other tests to check for causes of muscle damage and to check for complications. °How is this treated? °Treatment for this condition helps to: °· Make sure you have enough fluids in your body. °· Lower the acid levels in your blood to reverse acidosis. °· Protect your kidneys. °Treatment may include: °· Fluids and medicines given through an IV tube that is inserted into one of your veins. °· Medicines to lower acidosis or to bring back the balance of the minerals in your body. °· Hemodialysis. This treatment uses an artificial kidney machine to filter your blood while you recover. You may have this if other treatments are not helping. °Follow these instructions at home: ° °· Take over-the-counter and prescription medicines only as told by your health care provider. °· Rest at home until your health care provider says that you can return to your normal activities. °· Drink enough fluid to keep your urine clear or pale yellow. °· Do not do activities that take a lot of effort (are strenuous). Ask your health care provider what level of exercise is safe for you. °· Do not abuse drugs or   alcohol. If you are having problems with drug or alcohol use, ask your health care provider for help. °· Keep all follow-up visits as told by your health care provider. This is important. °Contact a health care provider if: °· You start having symptoms of this condition after treatment. °Get  help right away if: °· You have a seizure. °· You bleed easily or cannot control bleeding. °· You cannot urinate. °· You have chest pain. °· You have trouble breathing. °This information is not intended to replace advice given to you by your health care provider. Make sure you discuss any questions you have with your health care provider. °Document Revised: 09/27/2017 Document Reviewed: 07/27/2016 °Elsevier Patient Education © 2020 [employer] ° °

## 2020-04-17 NOTE — ED Triage Notes (Signed)
Pt presents from home via [facility] for evaluation of increased weakness for the last day. EMS reports that pt does have a known left sided leaning that pt is to follow up with neurology. EMS reports that pt family called due to pt being unable to use walker. Pt reports feeling weak for the last day also has edema to lower extremities that has been ongoing for the last month.

## 2020-04-17 NOTE — ED Notes (Signed)
Pt is on a Pure Wick and waiting for her to void for a urine sample.

## 2020-04-18 ENCOUNTER — Observation Stay (HOSPITAL_COMMUNITY): Payer: Medicare HMO

## 2020-04-18 ENCOUNTER — Inpatient Hospital Stay: Payer: Self-pay

## 2020-04-18 ENCOUNTER — Encounter (HOSPITAL_COMMUNITY): Payer: Self-pay | Admitting: Family Medicine

## 2020-04-18 DIAGNOSIS — E860 Dehydration: Secondary | ICD-10-CM | POA: Diagnosis present

## 2020-04-18 DIAGNOSIS — R8281 Pyuria: Secondary | ICD-10-CM | POA: Diagnosis present

## 2020-04-18 DIAGNOSIS — M7989 Other specified soft tissue disorders: Secondary | ICD-10-CM | POA: Diagnosis present

## 2020-04-18 DIAGNOSIS — T796XXA Traumatic ischemia of muscle, initial encounter: Secondary | ICD-10-CM | POA: Diagnosis not present

## 2020-04-18 DIAGNOSIS — N179 Acute kidney failure, unspecified: Secondary | ICD-10-CM | POA: Diagnosis present

## 2020-04-18 DIAGNOSIS — I10 Essential (primary) hypertension: Secondary | ICD-10-CM | POA: Diagnosis present

## 2020-04-18 DIAGNOSIS — Z79899 Other long term (current) drug therapy: Secondary | ICD-10-CM | POA: Diagnosis not present

## 2020-04-18 DIAGNOSIS — G8220 Paraplegia, unspecified: Secondary | ICD-10-CM | POA: Diagnosis not present

## 2020-04-18 DIAGNOSIS — E46 Unspecified protein-calorie malnutrition: Secondary | ICD-10-CM | POA: Diagnosis present

## 2020-04-18 DIAGNOSIS — R7401 Elevation of levels of liver transaminase levels: Secondary | ICD-10-CM | POA: Diagnosis present

## 2020-04-18 DIAGNOSIS — M6282 Rhabdomyolysis: Secondary | ICD-10-CM | POA: Diagnosis present

## 2020-04-18 DIAGNOSIS — R600 Localized edema: Secondary | ICD-10-CM

## 2020-04-18 DIAGNOSIS — L89322 Pressure ulcer of left buttock, stage 2: Secondary | ICD-10-CM | POA: Diagnosis present

## 2020-04-18 DIAGNOSIS — Z8249 Family history of ischemic heart disease and other diseases of the circulatory system: Secondary | ICD-10-CM | POA: Diagnosis not present

## 2020-04-18 DIAGNOSIS — Z9181 History of falling: Secondary | ICD-10-CM | POA: Diagnosis not present

## 2020-04-18 DIAGNOSIS — R296 Repeated falls: Secondary | ICD-10-CM | POA: Diagnosis present

## 2020-04-18 DIAGNOSIS — R531 Weakness: Secondary | ICD-10-CM

## 2020-04-18 DIAGNOSIS — R262 Difficulty in walking, not elsewhere classified: Secondary | ICD-10-CM | POA: Diagnosis not present

## 2020-04-18 DIAGNOSIS — G20 Parkinson's disease: Secondary | ICD-10-CM | POA: Diagnosis present

## 2020-04-18 DIAGNOSIS — Z6827 Body mass index (BMI) 27.0-27.9, adult: Secondary | ICD-10-CM | POA: Diagnosis not present

## 2020-04-18 DIAGNOSIS — Z20822 Contact with and (suspected) exposure to covid-19: Secondary | ICD-10-CM | POA: Diagnosis present

## 2020-04-18 DIAGNOSIS — J9811 Atelectasis: Secondary | ICD-10-CM | POA: Diagnosis present

## 2020-04-18 DIAGNOSIS — M6281 Muscle weakness (generalized): Secondary | ICD-10-CM | POA: Diagnosis present

## 2020-04-18 DIAGNOSIS — E872 Acidosis: Secondary | ICD-10-CM | POA: Diagnosis present

## 2020-04-18 DIAGNOSIS — Z87891 Personal history of nicotine dependence: Secondary | ICD-10-CM | POA: Diagnosis not present

## 2020-04-18 DIAGNOSIS — L89896 Pressure-induced deep tissue damage of other site: Secondary | ICD-10-CM | POA: Diagnosis present

## 2020-04-18 DIAGNOSIS — Z841 Family history of disorders of kidney and ureter: Secondary | ICD-10-CM | POA: Diagnosis not present

## 2020-04-18 DIAGNOSIS — Z7982 Long term (current) use of aspirin: Secondary | ICD-10-CM | POA: Diagnosis not present

## 2020-04-18 DIAGNOSIS — D751 Secondary polycythemia: Secondary | ICD-10-CM | POA: Diagnosis present

## 2020-04-18 LAB — URINALYSIS, ROUTINE W REFLEX MICROSCOPIC
Bilirubin Urine: NEGATIVE
Glucose, UA: 50 mg/dL — AB
Ketones, ur: 20 mg/dL — AB
Leukocytes,Ua: NEGATIVE
Nitrite: NEGATIVE
Protein, ur: >=300 mg/dL — AB
Specific Gravity, Urine: 1.018 (ref 1.005–1.030)
pH: 6 (ref 5.0–8.0)

## 2020-04-18 LAB — SODIUM, URINE, RANDOM: Sodium, Ur: 28 mmol/L

## 2020-04-18 LAB — BASIC METABOLIC PANEL
Anion gap: 13 (ref 5–15)
BUN: 42 mg/dL — ABNORMAL HIGH (ref 8–23)
CO2: 21 mmol/L — ABNORMAL LOW (ref 22–32)
Calcium: 8.5 mg/dL — ABNORMAL LOW (ref 8.9–10.3)
Chloride: 108 mmol/L (ref 98–111)
Creatinine, Ser: 1.9 mg/dL — ABNORMAL HIGH (ref 0.44–1.00)
GFR calc Af Amer: 30 mL/min — ABNORMAL LOW (ref >60)
GFR calc non Af Amer: 26 mL/min — ABNORMAL LOW (ref >60)
Glucose, Bld: 167 mg/dL — ABNORMAL HIGH (ref 70–99)
Potassium: 3.5 mmol/L (ref 3.5–5.1)
Sodium: 142 mmol/L (ref 135–145)

## 2020-04-18 LAB — CREATININE, URINE, RANDOM: Creatinine, Urine: 223.66 mg/dL

## 2020-04-18 LAB — ECHOCARDIOGRAM COMPLETE
Height: 58 in
Weight: 2080 oz

## 2020-04-18 LAB — SARS CORONAVIRUS 2 BY RT PCR (HOSPITAL ORDER, PERFORMED IN ~~LOC~~ HOSPITAL LAB): SARS Coronavirus 2: NEGATIVE

## 2020-04-18 LAB — CK: Total CK: 18250 U/L — ABNORMAL HIGH (ref 38–234)

## 2020-04-18 MED ORDER — METOPROLOL SUCCINATE ER 50 MG PO TB24
50.0000 mg | ORAL_TABLET | Freq: Every day | ORAL | Status: DC
  Administered 2020-04-18: 50 mg via ORAL
  Filled 2020-04-18: qty 1

## 2020-04-18 MED ORDER — SODIUM CHLORIDE 0.9 % IV SOLN: 250.0000 mL | INTRAVENOUS | Status: DC | PRN

## 2020-04-18 MED ORDER — PROSIGHT PO TABS
1.0000 | ORAL_TABLET | Freq: Every day | ORAL | Status: DC
  Administered 2020-04-18 – 2020-04-23 (×6): 1 via ORAL
  Filled 2020-04-18 (×6): qty 1

## 2020-04-18 MED ORDER — SODIUM CHLORIDE 0.9% FLUSH: 3.0000 mL | INTRAVENOUS | Status: DC | PRN

## 2020-04-18 MED ORDER — SODIUM CHLORIDE 0.9% FLUSH
3.0000 mL | Freq: Two times a day (BID) | INTRAVENOUS | Status: DC
  Administered 2020-04-18: 3 mL via INTRAVENOUS

## 2020-04-18 MED ORDER — SODIUM CHLORIDE 0.9 % IV SOLN
1.0000 g | Freq: Once | INTRAVENOUS | Status: AC
  Administered 2020-04-18: 1 g via INTRAVENOUS
  Filled 2020-04-18: qty 10

## 2020-04-18 MED ORDER — ACETAMINOPHEN 325 MG PO TABS
650.0000 mg | ORAL_TABLET | Freq: Four times a day (QID) | ORAL | Status: DC | PRN
  Administered 2020-04-19 – 2020-04-22 (×7): 650 mg via ORAL
  Filled 2020-04-18 (×7): qty 2

## 2020-04-18 MED ORDER — TOBRAMYCIN 0.3 % OP SOLN
2.0000 [drp] | Freq: Once | OPHTHALMIC | Status: AC
  Administered 2020-04-18: 2 [drp] via OPHTHALMIC
  Filled 2020-04-18: qty 5

## 2020-04-18 MED ORDER — ASPIRIN EC 81 MG PO TBEC
81.0000 mg | DELAYED_RELEASE_TABLET | Freq: Every day | ORAL | Status: DC
  Administered 2020-04-18 – 2020-04-23 (×6): 81 mg via ORAL
  Filled 2020-04-18 (×6): qty 1

## 2020-04-18 MED ORDER — SODIUM CHLORIDE 0.9% FLUSH: 10.0000 mL | INTRAVENOUS | Status: DC | PRN

## 2020-04-18 MED ORDER — ONDANSETRON HCL 4 MG PO TABS: 4.0000 mg | ORAL_TABLET | Freq: Four times a day (QID) | ORAL | Status: DC | PRN

## 2020-04-18 MED ORDER — SENNOSIDES-DOCUSATE SODIUM 8.6-50 MG PO TABS
1.0000 | ORAL_TABLET | Freq: Every evening | ORAL | Status: DC | PRN
  Administered 2020-04-21: 1 via ORAL
  Filled 2020-04-18: qty 1

## 2020-04-18 MED ORDER — ACETAMINOPHEN 650 MG RE SUPP: 650.0000 mg | Freq: Four times a day (QID) | RECTAL | Status: DC | PRN

## 2020-04-18 MED ORDER — ONDANSETRON HCL 4 MG/2ML IJ SOLN: 4.0000 mg | Freq: Four times a day (QID) | INTRAMUSCULAR | Status: DC | PRN

## 2020-04-18 MED ORDER — SODIUM CHLORIDE 0.9 % IV SOLN: INTRAVENOUS | Status: DC

## 2020-04-18 MED ORDER — VITAMIN B-12 1000 MCG PO TABS
1000.0000 ug | ORAL_TABLET | Freq: Every day | ORAL | Status: DC
  Administered 2020-04-18 – 2020-04-23 (×6): 1000 ug via ORAL
  Filled 2020-04-18 (×6): qty 1

## 2020-04-18 MED ORDER — SODIUM CHLORIDE 0.9% FLUSH
10.0000 mL | Freq: Two times a day (BID) | INTRAVENOUS | Status: DC
  Administered 2020-04-19 – 2020-04-23 (×3): 10 mL

## 2020-04-18 MED ORDER — ENOXAPARIN SODIUM 30 MG/0.3ML ~~LOC~~ SOLN
30.0000 mg | SUBCUTANEOUS | Status: DC
  Administered 2020-04-18 – 2020-04-20 (×3): 30 mg via SUBCUTANEOUS
  Filled 2020-04-18 (×3): qty 0.3

## 2020-04-18 MED ORDER — THIAMINE HCL 100 MG PO TABS
100.0000 mg | ORAL_TABLET | Freq: Every day | ORAL | Status: DC
  Administered 2020-04-18 – 2020-04-23 (×6): 100 mg via ORAL
  Filled 2020-04-18 (×7): qty 1

## 2020-04-18 MED ORDER — ENSURE ENLIVE PO LIQD
237.0000 mL | Freq: Two times a day (BID) | ORAL | Status: DC
  Administered 2020-04-18 – 2020-04-22 (×7): 237 mL via ORAL

## 2020-04-18 NOTE — ED Provider Notes (Signed)
Nursing notes and vitals signs, including pulse oximetry, reviewed.  Summary of this visit's results, reviewed by myself:  EKG:  EKG Interpretation  Date/Time:  Sunday April 17 2020 20:28:33 EDT Ventricular Rate:  80 PR Interval:    QRS Duration: 84 QT Interval:  388 QTC Calculation: 448 R Axis:   43 Text Interpretation: Sinus rhythm Non-specific ST-t changes No previous tracing Confirmed by Lajean Saver [phone]) on 04/17/2020 8:31:24 PM       Labs:  Results for orders placed or performed during the hospital encounter of 04/17/20 (from the past 24 hour(s))  CBC     Status: Abnormal   Collection Time: 04/17/20  8:20 PM  Result Value Ref Range   WBC 10.6 (H) 4.0 - 10.5 K/uL   RBC 5.57 (H) 3.87 - 5.11 MIL/uL   Hemoglobin 16.5 (H) 12.0 - 15.0 g/dL   HCT 50.6 (H) 36 - 46 %   MCV 90.8 80.0 - 100.0 fL   MCH 29.6 26.0 - 34.0 pg   MCHC 32.6 30.0 - 36.0 g/dL   RDW 13.6 11.5 - 15.5 %   Platelets 203 150 - 400 K/uL   nRBC 0.0 0.0 - 0.2 %  Comprehensive metabolic panel     Status: Abnormal   Collection Time: 04/17/20  8:20 PM  Result Value Ref Range   Sodium 144 135 - 145 mmol/L   Potassium 4.1 3.5 - 5.1 mmol/L   Chloride 105 98 - 111 mmol/L   CO2 23 22 - 32 mmol/L   Glucose, Bld 146 (H) 70 - 99 mg/dL   BUN 29 (H) 8 - 23 mg/dL   Creatinine, Ser 1.33 (H) 0.44 - 1.00 mg/dL   Calcium 9.4 8.9 - 10.3 mg/dL   Total Protein 8.6 (H) 6.5 - 8.1 g/dL   Albumin 3.9 3.5 - 5.0 g/dL   AST 156 (H) 15 - 41 U/L   ALT 35 0 - 44 U/L   Alkaline Phosphatase 70 38 - 126 U/L   Total Bilirubin 0.9 0.3 - 1.2 mg/dL   GFR calc non Af Amer 39 (L) >60 mL/min   GFR calc Af Amer 46 (L) >60 mL/min   Anion gap 16 (H) 5 - 15  Brain natriuretic peptide     Status: Abnormal   Collection Time: 04/17/20  8:25 PM  Result Value Ref Range   B Natriuretic Peptide 171.0 (H) 0.0 - 100.0 pg/mL    Imaging Studies: CT HEAD WO CONTRAST  Result Date: 04/17/2020 CLINICAL DATA:  Headache, post traumatic Patient reports  weakness. EXAM: CT HEAD WITHOUT CONTRAST TECHNIQUE: Contiguous axial images were obtained from the base of the skull through the vertex without intravenous contrast. COMPARISON:  Head CT 01/12/2020 FINDINGS: Brain: No intracranial hemorrhage, mass effect, or midline shift. Generalized atrophy and chronic small vessel ischemia, stable from prior. 3 No hydrocephalus. The basilar cisterns are patent. No evidence of territorial infarct or acute ischemia. No extra-axial or intracranial fluid collection. Vascular: Atherosclerosis of skullbase vasculature without hyperdense vessel or abnormal calcification. Skull: No fracture or focal lesion. Sinuses/Orbits: Mucosal thickening throughout ethmoid air cells and involving bilateral maxillary sinuses, chronic but slightly progressed from prior. No sinus fluid level. Mastoid air cells are clear. Other: Subcutaneous soft tissue edema involving the left side of the face. There is diffuse fluid in the scalp which may be subgaleal measuring up to 11 mm. IMPRESSION: 1. No acute intracranial abnormality. No skull fracture. 2. Stable atrophy and chronic small vessel ischemia. 3. Subcutaneous soft tissue  edema involving the left side of the face. Diffuse fluid in the scalp, near circumferential, which may be subgaleal in location measuring up to 11 mm in thickness. This is of unknown etiology. Electronically Signed   By: Keith Rake M.D.   On: 04/17/2020 21:54   CT CERVICAL SPINE WO CONTRAST  Result Date: 04/17/2020 CLINICAL DATA:  Neck trauma, blunt Patient reports generalized weakness. EXAM: CT CERVICAL SPINE WITHOUT CONTRAST TECHNIQUE: Multidetector CT imaging of the cervical spine was performed without intravenous contrast. Multiplanar CT image reconstructions were also generated. COMPARISON:  Cervical spine CT 01/12/2020 FINDINGS: Alignment: Broad-based rightward curvature, not seen on prior. Minor retrolisthesis of C4 on C5, C5 on C6 and C7 on T1, unchanged. Skull base  and vertebrae: No acute fracture. No primary bone lesion or focal pathologic process. Soft tissues and spinal canal: No prevertebral fluid or swelling. No visible canal hematoma. Disc levels: Stable degree of diffuse degenerative disc disease and facet hypertrophy. Right neural foraminal stenosis at C4-C5 is unchanged. Upper chest: No acute or unexpected findings. Other: Carotid calcifications. IMPRESSION: 1. No acute fracture or subluxation of the cervical spine. 2. Broad-based rightward curvature of the cervical spine is new from prior, may be positioning or related to muscle spasm. 3. Multilevel degenerative disc disease and facet hypertrophy is unchanged from prior. Electronically Signed   By: Keith Rake M.D.   On: 04/17/2020 21:48   DG Chest Port 1 View  Result Date: 04/17/2020 CLINICAL DATA:  Increased weakness x1 day. EXAM: PORTABLE CHEST 1 VIEW COMPARISON:  None. FINDINGS: There is no evidence of acute infiltrate, pleural effusion or pneumothorax. The heart size and mediastinal contours are within normal limits. There is mild calcification of the aortic arch. Multilevel degenerative changes seen throughout the thoracic spine. IMPRESSION: No active disease. Electronically Signed   By: Virgina Norfolk M.D.   On: 04/17/2020 20:44   1:46 AM Urinalysis concerning for UTI.  Will have patient admitted for UTI and generalized weakness and edema.   Refujio Haymer, Jenny Reichmann, MD 04/18/20 [id number]

## 2020-04-18 NOTE — H&P (Signed)
History and Physical    Diane Snow FOY:[id number] DOB: [birth year] DOA: 04/17/2020  PCP: Mack Hook, MD   Patient coming from: Home   Chief Complaint: Weakness   HPI: Diane Snow is a 74 y.o. female with medical history significant for hypertension and bilateral lower extremity weakness, presenting to the emergency department for evaluation of progressive weakness.  Patient complains of general weakness that is most notable in the bilateral lower extremities but worse on the left.  This has been going on for at least a year now but it has been worsening to the point where she can no longer ambulate with her walker as she had been.  There was no fall or trauma preceding the worsening and she had been evaluated with MRI that was negative for acute findings in March.  She also reported a resting tremor, has been noted to have stiff joints, masked facies, and gait abnormality that raised concern for Parkinson's and she is being referred to neurology by her PCP for evaluation of this.  Patient also reports some bilateral lower extremity swelling that developed over the past month or 2, has not improved despite stopping her amlodipine, and has not improved with Lasix either.  She denies headache, change in vision or hearing, fevers, chills, chest pain, dysuria, flank pain, or hematuria.  ED Course: Upon arrival to the ED, patient is found to be afebrile, saturating well on room air, and with stable blood pressure.  EKG features sinus rhythm and chest x-rays negative for acute cardiopulmonary disease.  Noncontrast head CT is negative for acute intracranial abnormality and there is no acute fracture or subluxation on cervical spine CT.  Chemistry panel is notable for creatinine 1.33, up from 0.89 a few days earlier.  AST is elevated to 156.  She has a new mild polycythemia and mild leukocytosis.  BNP is elevated 271.  Patient was given a liter of saline and 1 g IV Rocephin in the ED.  COVID-19  screening test has not yet resulted.  Review of Systems:  All other systems reviewed and apart from HPI, are negative.  Past Medical History:  Diagnosis Date  . Allergy   . Arthritis   . Hx of adenomatous colonic polyps 09/27/2014  . Hypertension     Past Surgical History:  Procedure Laterality Date  . BREAST BIOPSY Left 1976  . BREAST EXCISIONAL BIOPSY Left   . COLONOSCOPY    . PARTIAL HYSTERECTOMY     fibroid tumors     reports that she quit smoking about 9 years ago. Her smoking use included cigarettes. She has a 20.00 pack-year smoking history. She has never used smokeless tobacco. She reports current alcohol use. She reports that she does not use drugs.  Allergies  Allergen Reactions  . Aspirin Other (See Comments)    More than an 81 mg asa makes pt gag    Family History  Problem Relation Age of Onset  . Kidney disease Mother        cause of death  . Hypertension Mother   . Heart disease Father        CHF cause of death  . Hypertension Father   . Breast cancer Maternal Aunt   . Heart disease Brother        CHF cause of death  . Gout Brother   . Colon cancer Neg Hx   . Rectal cancer Neg Hx   . Stomach cancer Neg Hx   . Esophageal cancer Neg  Hx   . Pancreatic cancer Neg Hx      Prior to Admission medications   Medication Sig Start Date End Date Taking? Authorizing Provider  aspirin 81 MG tablet Take 81 mg by mouth daily.   Yes [provider]  furosemide (LASIX) 20 MG tablet Take 20 mg by mouth daily. 12/30/19  Yes [provider]  metoprolol succinate (TOPROL-XL) 50 MG 24 hr tablet Take 1 tablet (50 mg total) by mouth daily. Take with or immediately following a meal. 04/12/20  Yes Mack Hook, MD  potassium chloride (K-DUR,KLOR-CON) 10 MEQ tablet Take 10 mEq by mouth daily. 04/29/18  Yes [provider]  vitamin B-12 (CYANOCOBALAMIN) 1000 MCG tablet Take 1 tablet (1,000 mcg total) by mouth daily. 04/12/20  Yes Mack Hook, MD  diclofenac sodium (VOLTAREN) 1 % GEL Apply 4 g topically 4 (four) times daily. Patient not taking: Reported on 04/18/2020 02/11/19   Pete Pelt, PA-C    Physical Exam: Vitals:   04/17/20 2017 04/17/20 2230 04/18/20 0001 04/18/20 0100  BP: (!) 156/103 (!) 154/94 (!) 176/91 (!) 148/85  Pulse: 75 79 79 77  Resp: 15 15 15 17   Temp:      SpO2: 100% 100% 100% 99%  Weight:  59 kg    Height:  4\' 10"  (1.473 m)      Constitutional: NAD, calm  Eyes: PERTLA, lids and conjunctivae normal ENMT: Mucous membranes are moist. Posterior pharynx clear of any exudate or lesions.   Neck: normal, supple, no masses, no thyromegaly Respiratory: no wheezing, no crackles. No accessory muscle use.  Cardiovascular: S1 & S2 heard, regular rate and rhythm. Pretibial pitting edema bilaterally.  Abdomen: No distension, no tenderness, soft. Bowel sounds active.  Musculoskeletal: no clubbing / cyanosis. No joint deformity upper and lower extremities.   Skin: no significant rashes, lesions, ulcers. Warm, dry, well-perfused. Neurologic: CN 2-12 grossly intact. Sensation to light touch intact. Strength is 4/5 and symmetric throughout bilateral UEs and 3-4/5 throughout bilateral LEs with poor effort despite encouragement.   Psychiatric: Sleeping, wakes to loud voice. Oriented to person, place, and situation. Calm.    Labs and Imaging on Admission: I have personally reviewed following labs and imaging studies  CBC: Recent Labs  Lab 04/17/20 2020  WBC 10.6*  HGB 16.5*  HCT 50.6*  MCV 90.8  PLT 811   Basic Metabolic Panel: Recent Labs  Lab 04/12/20 1631 04/17/20 2020  NA 138 144  K 3.8 4.1  CL 101 105  CO2 23 23  GLUCOSE 83 146*  BUN 15 29*  CREATININE 0.89 1.33*  CALCIUM 9.3 9.4   GFR: Estimated Creatinine Clearance: 28.2 mL/min (A) (by C-G formula based on SCr of 1.33 mg/dL (H)). Liver Function Tests: Recent Labs  Lab 04/12/20 1631 04/17/20 2020  AST 20 156*  ALT 7 35    ALKPHOS 73 70  BILITOT 0.4 0.9  PROT 7.3 8.6*  ALBUMIN 4.0 3.9   No results for input(s): LIPASE, AMYLASE in the last 168 hours. No results for input(s): AMMONIA in the last 168 hours. Coagulation Profile: No results for input(s): INR, PROTIME in the last 168 hours. Cardiac Enzymes: No results for input(s): CKTOTAL, CKMB, CKMBINDEX, TROPONINI in the last 168 hours. BNP (last 3 results) No results for input(s): PROBNP in the last 8760 hours. HbA1C: No results for input(s): HGBA1C in the last 72 hours. CBG: No results for input(s): GLUCAP in the last 168 hours. Lipid Profile: No results for input(s): CHOL,  HDL, LDLCALC, TRIG, CHOLHDL, LDLDIRECT in the last 72 hours. Thyroid Function Tests: No results for input(s): TSH, T4TOTAL, FREET4, T3FREE, THYROIDAB in the last 72 hours. Anemia Panel: No results for input(s): VITAMINB12, FOLATE, FERRITIN, TIBC, IRON, RETICCTPCT in the last 72 hours. Urine analysis:    Component Value Date/Time   COLORURINE AMBER (A) 04/17/2020 0007   APPEARANCEUR CLOUDY (A) 04/17/2020 0007   LABSPEC 1.018 04/17/2020 0007   PHURINE 6.0 04/17/2020 0007   GLUCOSEU 50 (A) 04/17/2020 0007   HGBUR LARGE (A) 04/17/2020 0007   BILIRUBINUR NEGATIVE 04/17/2020 0007   KETONESUR 20 (A) 04/17/2020 0007   PROTEINUR >=300 (A) 04/17/2020 0007   NITRITE NEGATIVE 04/17/2020 0007   LEUKOCYTESUR NEGATIVE 04/17/2020 0007   Sepsis Labs: @LABRCNTIP (procalcitonin:4,lacticidven:4) )No results found for this or any previous visit (from the past 240 hour(s)).   Radiological Exams on Admission: CT HEAD WO CONTRAST  Result Date: 04/17/2020 CLINICAL DATA:  Headache, post traumatic Patient reports weakness. EXAM: CT HEAD WITHOUT CONTRAST TECHNIQUE: Contiguous axial images were obtained from the base of the skull through the vertex without intravenous contrast. COMPARISON:  Head CT 01/12/2020 FINDINGS: Brain: No intracranial hemorrhage, mass effect, or midline shift. Generalized  atrophy and chronic small vessel ischemia, stable from prior. 3 No hydrocephalus. The basilar cisterns are patent. No evidence of territorial infarct or acute ischemia. No extra-axial or intracranial fluid collection. Vascular: Atherosclerosis of skullbase vasculature without hyperdense vessel or abnormal calcification. Skull: No fracture or focal lesion. Sinuses/Orbits: Mucosal thickening throughout ethmoid air cells and involving bilateral maxillary sinuses, chronic but slightly progressed from prior. No sinus fluid level. Mastoid air cells are clear. Other: Subcutaneous soft tissue edema involving the left side of the face. There is diffuse fluid in the scalp which may be subgaleal measuring up to 11 mm. IMPRESSION: 1. No acute intracranial abnormality. No skull fracture. 2. Stable atrophy and chronic small vessel ischemia. 3. Subcutaneous soft tissue edema involving the left side of the face. Diffuse fluid in the scalp, near circumferential, which may be subgaleal in location measuring up to 11 mm in thickness. This is of unknown etiology. Electronically Signed   By: Keith Rake M.D.   On: 04/17/2020 21:54   CT CERVICAL SPINE WO CONTRAST  Result Date: 04/17/2020 CLINICAL DATA:  Neck trauma, blunt Patient reports generalized weakness. EXAM: CT CERVICAL SPINE WITHOUT CONTRAST TECHNIQUE: Multidetector CT imaging of the cervical spine was performed without intravenous contrast. Multiplanar CT image reconstructions were also generated. COMPARISON:  Cervical spine CT 01/12/2020 FINDINGS: Alignment: Broad-based rightward curvature, not seen on prior. Minor retrolisthesis of C4 on C5, C5 on C6 and C7 on T1, unchanged. Skull base and vertebrae: No acute fracture. No primary bone lesion or focal pathologic process. Soft tissues and spinal canal: No prevertebral fluid or swelling. No visible canal hematoma. Disc levels: Stable degree of diffuse degenerative disc disease and facet hypertrophy. Right neural  foraminal stenosis at C4-C5 is unchanged. Upper chest: No acute or unexpected findings. Other: Carotid calcifications. IMPRESSION: 1. No acute fracture or subluxation of the cervical spine. 2. Broad-based rightward curvature of the cervical spine is new from prior, may be positioning or related to muscle spasm. 3. Multilevel degenerative disc disease and facet hypertrophy is unchanged from prior. Electronically Signed   By: Keith Rake M.D.   On: 04/17/2020 21:48   DG Chest Port 1 View  Result Date: 04/17/2020 CLINICAL DATA:  Increased weakness x1 day. EXAM: PORTABLE CHEST 1 VIEW COMPARISON:  None. FINDINGS: There is no  evidence of acute infiltrate, pleural effusion or pneumothorax. The heart size and mediastinal contours are within normal limits. There is mild calcification of the aortic arch. Multilevel degenerative changes seen throughout the thoracic spine. IMPRESSION: No active disease. Electronically Signed   By: Virgina Norfolk M.D.   On: 04/17/2020 20:44    EKG: Independently reviewed. Sinus rhythm.   Assessment/Plan   1. Weakness  - Presents with generalized weakness, mainly involving bilateral LEs and worse on left per patient, worsening over a period of months and now to the point where she can no longer ambulate with her walker  - She has been seeing her PCP for this, had no acute findings on MRI brain in March, had normal TSH and B12 recently, and was being referred to neurology  - No acute findings on CT head tonight  - Check CK, consult with PT, continue supportive care    2. Acute kidney injury  - SCr is 1.33, up from 0.89 a few days earlier  - She was given a liter of NS in ED  - Check urine chemistries, hold Lasix, repeat chem panel in am    3. Leg swelling  - Has not improved with stopping Norvasc or with Lasix per patient  - BNP is mildly elevated without pulm edema or JVD  - Hold Lasix for now given increased SCr, elevate legs, check echocardiogram   4.  Hypertension  - Continue metoprolol    5. ?UTI  - Patient was given a dose of Rocephin in ED for UTI but there is negative leukocytes and nitrites on UA and she denies urinary sxs, denies fever/chiils, and denies suprapubic or flank pain    DVT prophylaxis: Lovenox  Code Status: Full  Family Communication: Discussed with patient  Disposition Plan:  Patient is from: Home  Anticipated d/c is to: TBD Anticipated d/c date is: 04/19/20 Patient currently: pending CK level, echocardiogram, PT evaluation, monitoring of renal function  Consults called: None  Admission status: Observation    Vianne Bulls, MD [facility] Pager: See [email or web address]  If 7AM-7PM, please contact the daytime attending [email or web address]  04/18/2020, 2:38 AM

## 2020-04-18 NOTE — Progress Notes (Signed)
VAST consulted to obtain IV access. Bilateral arms assessed with ultrasound; no appropriate veins for USGIV (veins too small or too deep). Contacted physician for permission to place midline as patient's CrCl is <30. Physician ordered midline placement.

## 2020-04-18 NOTE — Progress Notes (Signed)
PROGRESS NOTE    Diane Snow  UYQ:[id number] DOB: 12-May-1946 DOA: 04/17/2020 PCP: Mack Hook, MD    Brief Narrative:  Patient was admitted to the hospital with a working diagnosis of generalized weakness with ambulatory dysfunction.  74 year old female with significant past medical history for hypertension and bilateral lower extremity weakness who presented due to progressive weakness.  Her ambulatory dysfunction and lower extremity weakness had a rapid progression to the point where she had difficulty using her walker. She was referred to neurology as outpatient for the evaluation of possible parkinson's disease.   On the day of admission she was found down leaning towards a chair, no loss of consciousness, but unable to stand up, for at least 8 H. Her family got into her house because she was not answering the phone. She lives alone, and apparently very poor oral intake.    She had outpatient work-up with brain MRI and also she is being referred to neurology due to possible Parkinson's disease. On her initial physical examination blood pressure 156/94, heart rate 79, respiratory rate of 15, oxygen saturation 99%.  Her lungs are clear to auscultation bilaterally, heart S1-S2, present rhythmic, her abdomen was soft, no lower extremity edema.  Her strength was 4/5 upper extremities, 3-4/5 lower extremities. Sodium 144, potassium 4.1, chloride 105, bicarb 23, glucose 146, BUN 29, creatinine 1.3, AST 156, ALT 35, CK 18,250, white count 10.6, hemoglobin 16.5, hematocrit 50.6, platelets 203.  SARS COVID-19 negative.  Urinalysis > 300 protein, specific gravity 1.018, 21-50 white cells, 0-5 red cells.  Urinary sodium 28. CT head with no acute changes.  CT cervical spine no acute changes.  Chest radiograph with hyperinflation, faint left atelectasis. EKG 80 bpm, normal axis, normal intervals, sinus rhythm, no ST segment or T wave changes.    Assessment & Plan:   Principal Problem:    Rhabdomyolysis Active Problems:   Essential hypertension   Generalized weakness   AKI (acute kidney injury) ([place])   Leg swelling   Ambulatory dysfunction   1. Rhabdomyolysis complicated with AKI. Patient with ambulatory dysfunction, recent fall, unable to stand for at least 8 H. CK is 18,245 today. Patient clinically very dry.   Will place patient on IV fluids, isotonic saline at 100 ml per H. Follow up renal function and urine output. Avoid hypotension and nephrotoxic medications. Check CK in am.  2. Ambulatory dysfunction/ possible parkinson's disease. Patient with lower extremity weakness and cogwheel rigidity right upper extremity, decreased facial expression, but no tremors.    Will continue supportive medical therapy with IV fluids, will add thiamine and multivitamins. Check B 12 level. Follow with PT and OT. Out of bed to chair tid with meals.   3. HTN. Blood pressure 125/91 mmHg, will continue to hold on antihypertensive medications, follow up on echocardiogram.   4. Pyuria. Positive wbc in urine analysis but no urinary symptoms, will follow on urine culture, for now will continue to hold on antibiotic therapy.   5. Moderate to severe calorie protein malnutrition. Will consult nutrition for supplementation.   Patient continue to be at high risk for worsening renal function.   Status is: Observation  The patient will require care spanning > 2 midnights and should be moved to inpatient because: IV treatments appropriate due to intensity of illness or inability to take PO  Dispo: The patient is from: Home              Anticipated d/c is to: SNF  Anticipated d/c date is: 3 days              Patient currently is not medically stable to d/c.   DVT prophylaxis: Enoxaparin   Code Status:   full  Family Communication:  I spoke with patient's sister in law at the bedside, we talked in detail about patient's condition, plan of care and prognosis and all questions were  addressed.     Subjective: Patient continue to be very weak and deconditioned, no nausea or vomiting. Not able to move lower extremities.   Objective: Vitals:   04/18/20 0400 04/18/20 0802 04/18/20 0848 04/18/20 1008  BP: (!) 142/76 119/88 140/89 (!) 125/91  Pulse: 80 86 75   Resp: 15 15 16    Temp:   97.8 F (36.6 C)   TempSrc:   Oral   SpO2: 100% 98% 95%   Weight:      Height:        Intake/Output Summary (Last 24 hours) at 04/18/2020 1226 Last data filed at 04/18/2020 0304 Gross per 24 hour  Intake 1099.95 ml  Output 10 ml  Net 1089.95 ml   Filed Weights   04/17/20 2230  Weight: 59 kg    Examination:   General: deconditioned and ill looking appearing Neurology: lower extremity distal 4/5, proximal 0/5, right upper extremity with cogwheel rigidity, atrophic upper extremities. Decrease facial expression, no tremors.  E ENT: no pallor, no icterus, oral mucosa very dry Cardiovascular: No JVD. S1-S2 present, rhythmic, no gallops, rubs, or murmurs. ++ non pitting bilateral lower extremity edema. Pulmonary: positive breath sounds bilaterally, adequate air movement, no wheezing, rhonchi or rales. Gastrointestinal. Abdomen with no organomegaly, non tender, no rebound or guarding Skin. No rashes Musculoskeletal: no joint deformities     Data Reviewed: I have personally reviewed following labs and imaging studies  CBC: Recent Labs  Lab 04/17/20 2020  WBC 10.6*  HGB 16.5*  HCT 50.6*  MCV 90.8  PLT 956   Basic Metabolic Panel: Recent Labs  Lab 04/12/20 1631 04/17/20 2020  NA 138 144  K 3.8 4.1  CL 101 105  CO2 23 23  GLUCOSE 83 146*  BUN 15 29*  CREATININE 0.89 1.33*  CALCIUM 9.3 9.4   GFR: Estimated Creatinine Clearance: 28.2 mL/min (A) (by C-G formula based on SCr of 1.33 mg/dL (H)). Liver Function Tests: Recent Labs  Lab 04/12/20 1631 04/17/20 2020  AST 20 156*  ALT 7 35  ALKPHOS 73 70  BILITOT 0.4 0.9  PROT 7.3 8.6*  ALBUMIN 4.0 3.9   No  results for input(s): LIPASE, AMYLASE in the last 168 hours. No results for input(s): AMMONIA in the last 168 hours. Coagulation Profile: No results for input(s): INR, PROTIME in the last 168 hours. Cardiac Enzymes: Recent Labs  Lab 04/18/20 0919  CKTOTAL 18,250*   BNP (last 3 results) No results for input(s): PROBNP in the last 8760 hours. HbA1C: No results for input(s): HGBA1C in the last 72 hours. CBG: No results for input(s): GLUCAP in the last 168 hours. Lipid Profile: No results for input(s): CHOL, HDL, LDLCALC, TRIG, CHOLHDL, LDLDIRECT in the last 72 hours. Thyroid Function Tests: No results for input(s): TSH, T4TOTAL, FREET4, T3FREE, THYROIDAB in the last 72 hours. Anemia Panel: No results for input(s): VITAMINB12, FOLATE, FERRITIN, TIBC, IRON, RETICCTPCT in the last 72 hours.    Radiology Studies: I have reviewed all of the imaging during this hospital visit personally     Scheduled Meds: . aspirin EC  81 mg Oral Daily  . enoxaparin (LOVENOX) injection  30 mg Subcutaneous Q24H  . metoprolol succinate  50 mg Oral Daily  . sodium chloride flush  3 mL Intravenous Q12H  . sodium chloride flush  3 mL Intravenous Q12H  . vitamin B-12  1,000 mcg Oral Daily   Continuous Infusions: . sodium chloride       LOS: 0 days        Katelan Hirt Gerome Apley, MD

## 2020-04-18 NOTE — Evaluation (Signed)
Physical Therapy Evaluation Patient Details Name: Diane Snow MRN: [id number] DOB: [birth year] Today's Date: 04/18/2020   History of Present Illness  74 year old female with significant past medical history for hypertension and bilateral lower extremity weakness who presented due to progressive weakness.  Her ambulatory dysfunction and lower extremity weakness had a rapid progression to the point where she had difficulty using her walker. She was referred to neurology as outpatient for the evaluation of possible parkinson's disease.  Clinical Impression  Pt admitted with above diagnosis.  Pt currently with functional limitations due to the deficits listed below (see PT Problem List). Pt will benefit from skilled PT to increase their independence and safety with mobility to allow discharge to the venue listed below.  Pt unable to lift extremities against gravity and required total assist for rolling.  Pt with decline in function/mobility leading to admission however was ambulatory with walker prior to admission.     Follow Up Recommendations CIR    Equipment Recommendations  Wheelchair (measurements PT);Wheelchair cushion (measurements PT)    Recommendations for Other Services       Precautions / Restrictions Precautions Precautions: Fall      Mobility  Bed Mobility Overal bed mobility: Needs Assistance Bed Mobility: Rolling Rolling: Total assist         General bed mobility comments: pt unable to even lift her arm across her body, unable to pull her trunk off bed using bed rails  Transfers                 General transfer comment: NT -would need +2  Ambulation/Gait                Stairs            Wheelchair Mobility    Modified Rankin (Stroke Patients Only)       Balance                                             Pertinent Vitals/Pain Pain Assessment: No/denies pain    Home Living Family/patient expects to be  discharged to:: Skilled nursing facility (per computer notes) Living Arrangements: Alone Available Help at Discharge: Available PRN/intermittently;Friend(s) Type of Home: House       Home Layout: One level Home Equipment: Walker - 4 wheels;Walker - 2 wheels      Prior Function Level of Independence: Independent with assistive device(s)               Hand Dominance        Extremity/Trunk Assessment   Upper Extremity Assessment Upper Extremity Assessment: LUE deficits/detail;Generalized weakness LUE Deficits / Details: unable to lift against gravity; weaker then right side    Lower Extremity Assessment Lower Extremity Assessment: LLE deficits/detail;Generalized weakness LLE Deficits / Details: requiring more assist for AAROM, unable to lift against gravity, weaker then right side (pt also reports some chronic left LE weakness - "lift my leg to get it in the car"       Communication   Communication: No difficulties  Cognition Arousal/Alertness: Awake/alert Behavior During Therapy: WFL for tasks assessed/performed Overall Cognitive Status: Impaired/Different from baseline                                 General Comments: having trouble recalling event leading to admission;  friend reports pt was found supine draped over a chair?      General Comments      Exercises     Assessment/Plan    PT Assessment Patient needs continued PT services  PT Problem List Decreased mobility;Decreased knowledge of use of DME;Decreased coordination;Decreased activity tolerance;Decreased balance;Decreased strength;Decreased range of motion       PT Treatment Interventions Gait training;DME instruction;Therapeutic exercise;Balance training;Functional mobility training;Therapeutic activities;Patient/family education;Neuromuscular re-education;Wheelchair mobility training    PT Goals (Current goals can be found in the Care Plan section)  Acute Rehab PT Goals PT Goal  Formulation: With patient Time For Goal Achievement: 05/02/20 Potential to Achieve Goals: Good    Frequency Min 2X/week   Barriers to discharge        Co-evaluation               AM-PAC PT "6 Clicks" Mobility  Outcome Measure Help needed turning from your back to your side while in a flat bed without using bedrails?: Total Help needed moving from lying on your back to sitting on the side of a flat bed without using bedrails?: Total Help needed moving to and from a bed to a chair (including a wheelchair)?: Total Help needed standing up from a chair using your arms (e.g., wheelchair or bedside chair)?: Total Help needed to walk in hospital room?: Total Help needed climbing 3-5 steps with a railing? : Total 6 Click Score: 6    End of Session   Activity Tolerance: Patient tolerated treatment well Patient left: in bed;with call bell/phone within reach;with family/visitor present;with nursing/sitter in room Nurse Communication: Mobility status PT Visit Diagnosis: Other abnormalities of gait and mobility (R26.89);Other symptoms and signs involving the nervous system (R29.898)    Time: 7903-8333 PT Time Calculation (min) (ACUTE ONLY): 20 min   Charges:   PT Evaluation $PT Eval Low Complexity: 1 Low        Kati PT, DPT Acute Rehabilitation Services Pager: [phone] Office: [phone]  Trena Platt 04/18/2020, 4:18 PM

## 2020-04-18 NOTE — Progress Notes (Signed)
°  Echocardiogram 2D Echocardiogram has been performed.  Randa Lynn Shaunda Tipping 04/18/2020, 12:43 PM

## 2020-04-19 LAB — BASIC METABOLIC PANEL
Anion gap: 11 (ref 5–15)
BUN: 51 mg/dL — ABNORMAL HIGH (ref 8–23)
CO2: 19 mmol/L — ABNORMAL LOW (ref 22–32)
Calcium: 8.2 mg/dL — ABNORMAL LOW (ref 8.9–10.3)
Chloride: 111 mmol/L (ref 98–111)
Creatinine, Ser: 2.17 mg/dL — ABNORMAL HIGH (ref 0.44–1.00)
GFR calc Af Amer: 25 mL/min — ABNORMAL LOW (ref >60)
GFR calc non Af Amer: 22 mL/min — ABNORMAL LOW (ref >60)
Glucose, Bld: 103 mg/dL — ABNORMAL HIGH (ref 70–99)
Potassium: 3.6 mmol/L (ref 3.5–5.1)
Sodium: 141 mmol/L (ref 135–145)

## 2020-04-19 LAB — URINE CULTURE: Culture: NO GROWTH

## 2020-04-19 LAB — SEDIMENTATION RATE: Sed Rate: 15 mm/hr (ref 0–22)

## 2020-04-19 LAB — VITAMIN B12: Vitamin B-12: 1309 pg/mL — ABNORMAL HIGH (ref 180–914)

## 2020-04-19 LAB — UREA NITROGEN, URINE: Urea Nitrogen, Ur: 611 mg/dL

## 2020-04-19 LAB — C-REACTIVE PROTEIN: CRP: 10.4 mg/dL — ABNORMAL HIGH (ref <1.0)

## 2020-04-19 LAB — CK: Total CK: 1243 U/L — ABNORMAL HIGH (ref 38–234)

## 2020-04-19 MED ORDER — LACTATED RINGERS IV SOLN: INTRAVENOUS | Status: DC

## 2020-04-19 MED ORDER — CHLORHEXIDINE GLUCONATE CLOTH 2 % EX PADS
6.0000 | MEDICATED_PAD | Freq: Every day | CUTANEOUS | Status: DC
  Administered 2020-04-19 – 2020-04-21 (×3): 6 via TOPICAL

## 2020-04-19 NOTE — Evaluation (Signed)
Occupational Therapy Evaluation Patient Details Name: Diane Snow MRN: [id number] DOB: [birth year] Today's Date: 04/19/2020    History of Present Illness 74 year old female with significant past medical history for hypertension and bilateral lower extremity weakness who presented due to progressive weakness.  Her ambulatory dysfunction and lower extremity weakness had a rapid progression to the point where she had difficulty using her walker. She was referred to neurology as outpatient for the evaluation of possible parkinson's disease.   Clinical Impression   Patient with functional deficits listed below impacting safety and independence with self care. Patient with B UE weakness L greater than R UE with limited AROM of shoulder pivots. Educate patient in A/AAROM exercises to perform in bed to maximize functional use of UEs, patient able to return demo. Also instruct pt in neck AROM exercises as patient favoring head tilt to L. Patient with significant weakness, unable to mobilize LEs without extensive assistance, anticipate assist x2 to progress mobility. Will continue to follow.    Follow Up Recommendations  CIR    Equipment Recommendations  Other (comment) (TBD)       Precautions / Restrictions Precautions Precautions: Fall Restrictions Weight Bearing Restrictions: No      Mobility Bed Mobility Overal bed mobility: Needs Assistance             General bed mobility comments: patient reports requiring assist from nursing staff for rolling, patient unable to move LEs against gravity in bed and has B UE weakness  Transfers                 General transfer comment: deferred would need +2 assist for safety    Balance                                           ADL either performed or assessed with clinical judgement   ADL Overall ADL's : Needs assistance/impaired Eating/Feeding: Supervision/ safety;Bed level Eating/Feeding Details (indicate cue  type and reason): patient spills coffee when trying to take sip from cup Grooming: Bed level;Supervision/safety   Upper Body Bathing: Moderate assistance;Bed level   Lower Body Bathing: Total assistance;Bed level   Upper Body Dressing : Moderate assistance;Maximal assistance;Bed level   Lower Body Dressing: Total assistance;Bed level     Toilet Transfer Details (indicate cue type and reason): did not assess, patient is unable to mobilize LEs without significant assistance and with B UE weakness. anticipate significant assist x2 to perform transfers and due to x1 assist available deferred for safety Toileting- Clothing Manipulation and Hygiene: Total assistance         General ADL Comments: patient requiring extensive assist for self care tasks due to decreased strength, coordination, activity tolerance,      Vision Patient Visual Report: No change from baseline              Pertinent Vitals/Pain Pain Assessment: No/denies pain     Hand Dominance Right   Extremity/Trunk Assessment Upper Extremity Assessment Upper Extremity Assessment: Generalized weakness;LUE deficits/detail LUE Deficits / Details: very minimal shoulder flexion approx 30 degrees, significant weakness. with AAROM from R UE flexion to approx 60 degrees LUE Coordination: decreased gross motor;decreased fine motor   Lower Extremity Assessment Lower Extremity Assessment: Defer to PT evaluation       Communication Communication Communication: No difficulties   Cognition Arousal/Alertness: Awake/alert Behavior During Therapy: WFL for tasks assessed/performed Overall  Cognitive Status: Within Functional Limits for tasks assessed                                 General Comments: patient A/O to place, time, self, following 1 step directions appropriately   General Comments       Exercises Exercises: General Upper Extremity;General Lower Extremity;Shoulder;Other exercises General Exercises -  Upper Extremity Shoulder Flexion: AAROM;Both;5 reps;Supine Shoulder ABduction: AAROM;Left;5 reps;Supine Elbow Flexion: AROM;Both;5 reps;AAROM;Supine Elbow Extension: AROM;AAROM;Both;5 reps;Supine Digit Composite Flexion: AROM;Both;5 reps;Supine Composite Extension: AROM;Both;5 reps;Supine General Exercises - Lower Extremity Ankle Circles/Pumps: AAROM;Both;5 reps;Supine Shoulder Exercises Neck Lateral Flexion - Right: AROM;5 reps;Supine Neck Lateral Flexion - Left: AROM;5 reps;Supine Other Exercises Other Exercises: shoulder shrugs x5        Home Living Family/patient expects to be discharged to:: Private residence Living Arrangements: Alone Available Help at Discharge: Family;Friend(s);Available PRN/intermittently Type of Home: House       Home Layout: One level     Bathroom Shower/Tub: Occupational psychologist: Standard     Home Equipment: Environmental consultant - 2 wheels;Walker - 4 wheels;Bedside commode;Other (comment) (shower chair too big )          Prior Functioning/Environment Level of Independence: Independent with assistive device(s)                 OT Problem List: Decreased strength;Decreased range of motion;Impaired balance (sitting and/or standing);Decreased activity tolerance;Decreased coordination;Decreased safety awareness;Decreased knowledge of use of DME or AE;Impaired UE functional use      OT Treatment/Interventions: Self-care/ADL training;Therapeutic exercise;Neuromuscular education;Energy conservation;DME and/or AE instruction;Therapeutic activities;Patient/family education;Balance training    OT Goals(Current goals can be found in the care plan section) Acute Rehab OT Goals Patient Stated Goal: get stronger OT Goal Formulation: With patient/family Time For Goal Achievement: 05/03/20 Potential to Achieve Goals: Good  OT Frequency: Min 2X/week    AM-PAC OT "6 Clicks" Daily Activity     Outcome Measure Help from another person eating meals?:  A Little Help from another person taking care of personal grooming?: A Little Help from another person toileting, which includes using toliet, bedpan, or urinal?: Total Help from another person bathing (including washing, rinsing, drying)?: A Lot Help from another person to put on and taking off regular upper body clothing?: A Lot Help from another person to put on and taking off regular lower body clothing?: Total 6 Click Score: 12   End of Session Nurse Communication: Mobility status  Activity Tolerance: Patient tolerated treatment well Patient left: in bed;with call bell/phone within reach;with bed alarm set;with family/visitor present  OT Visit Diagnosis: Other abnormalities of gait and mobility (R26.89);Muscle weakness (generalized) (M62.81)                Time: 6759-1638 OT Time Calculation (min): 33 min Charges:  OT General Charges $OT Visit: 1 Visit OT Evaluation $OT Eval Moderate Complexity: 1 Mod OT Treatments $Therapeutic Exercise: 8-22 mins  Delbert Phenix OT Pager: [place] 04/19/2020, 2:17 PM

## 2020-04-19 NOTE — Progress Notes (Signed)
Pt transferred to unit, skin tear noted on L buttock. One large tear wheeping clear liquid, two small tears w no drainage.  Redness noted in area around tears  Will continue to turn q2

## 2020-04-19 NOTE — Progress Notes (Signed)
Initial Nutrition Assessment  INTERVENTION:   -Ensure Enlive po BID, each supplement provides 350 kcal and 20 grams of protein  NUTRITION DIAGNOSIS:   Unintentional weight loss related to acute illness as evidenced by percent weight loss.  GOAL:   Patient will meet greater than or equal to 90% of their needs  MONITOR:   PO intake, Supplement acceptance, Labs, Weight trends, I & O's  REASON FOR ASSESSMENT:   Consult Assessment of nutrition requirement/status  ASSESSMENT:   74 year old female with significant past medical history for hypertension and bilateral lower extremity weakness who presented due to progressive weakness.  Her ambulatory dysfunction and lower extremity weakness had a rapid progression to the point where she had difficulty using her walker. She was referred to neurology as outpatient for the evaluation of possible parkinson's disease.  Patient working with therapies at time of RD visit. Will attempt to speak with pt at follow-up. Pt currently consuming 50-75% of meals today. Pt is drinking Ensure supplements, will continue.  Per weight records, pt has lost 9 lbs since 5/11 (6% wt loss x 1.5 months, significant for time frame).   Labs reviewed. Medications: Prosight MVI, Vitamin B-12, Thiamine, Lactated ringers infusion  NUTRITION - FOCUSED PHYSICAL EXAM:  Unable to complete.  Diet Order:   Diet Order            Diet Heart Room service appropriate? Yes; Fluid consistency: Thin  Diet effective now                 EDUCATION NEEDS:   No education needs have been identified at this time  Skin:  Skin Assessment: Reviewed RN Assessment  Last BM:  PTA  Height:   Ht Readings from Last 1 Encounters:  04/17/20 4\' 10"  (1.473 m)    Weight:   Wt Readings from Last 1 Encounters:  04/17/20 59 kg    Ideal Body Weight:     BMI:  Body mass index is 27.17 kg/m.  Estimated Nutritional Needs:   Kcal:  1500-1700  Protein:  70-80g  Fluid:   1.7L/day  Clayton Bibles, MS, RD, LDN Inpatient Clinical Dietitian Contact information available via Amion

## 2020-04-19 NOTE — Progress Notes (Signed)
Inpatient Rehabilitation-Admissions Coordinator   CIR consult received. Spoke with pt and her cousin. Discussed recommended rehab program and the differences between CIR and SNF. Both the pt and her cousin recognize the need for rehab prior to DC home and would like to pursue some sort of post acute rehab. Upon detailed discussion, discovered the pt will not have 24/7 A at DC and based on current functional level documented from PT and OT notes, I do anticipate that after an IP Rehab stay, the patient will still need physical assist upon DC home. As this is not available (confirmed with both the patient and her cousin), the patient will need a different rehab program. The patient is open to a slower-paced, longer term program, such as a SNF. She also recognizes she may not be able to tolerate the high intensity rehab provided in IP Rehab. I have notified TOC team and the family is aware of current recommendation for SNF. AC will not pursue CIR at this time.    I will sign off. Please call if questions.  Raechel Ache, OTR/L  Rehab Admissions Coordinator  [phone] 04/19/2020 3:42 PM

## 2020-04-19 NOTE — Progress Notes (Signed)
No void entire shift bladder scan 209cc will continue to encourage Patent to void

## 2020-04-19 NOTE — TOC Initial Note (Signed)
Transition of Care [facility]) - Initial/Assessment Note    Patient Details  Name: Diane Snow MRN: [id number] Date of Birth: 1945/11/05  [facility] CM/SW Contact:    Lennart Pall, LCSW Phone Number: 04/19/2020, 1:39 PM  Clinical Narrative:       Met today with pt and her cousin, Mardene Celeste as well as pt's sister-in-law, [name], on the phone.  We discussed pt's current care needs and all are agreed that pt would benefit from rehabilitation.  They are aware that referral has been placed for CIR and we discussed the difference between rehab provided at CIR vs SNF.   The primary concerns for pt at this time are that she does live alone and, while there is good family support, there is no one who could provide 24/7 coverage.  Discussed the concerns that, should patient go to CIR and then still need SNF, that insurance may not agree to cover.   Medical work up continues at this point.  Will continue to follow and assist with transition to next venue.             Expected Discharge Plan: [place] (vs SNF) Barriers to Discharge: Continued Medical Work up   Patient Goals and CMS Choice Patient states their goals for this hospitalization and ongoing recovery are:: Pt would hope to be able to return home, however, realistic that rehab is needed at this time. CMS Medicare.gov Compare Post Acute Care list provided to:: Patient Choice offered to / list presented to : Patient  Expected Discharge Plan and Services Expected Discharge Plan: [place] (vs SNF) In-house Referral: Clinical Social Work   Post Acute Care Choice: [facility], IP Rehab Living arrangements for the past 2 months: [place]                                      Prior Living Arrangements/Services Living arrangements for the past 2 months: Single Family Home Lives with:: Self Patient language and need for interpreter reviewed:: Yes Do you feel safe going back to the  place where you live?: No   not at this current functional level  Need for Family Participation in Patient Care: Yes (Comment) Care giver support system in place?: No (comment)   Criminal Activity/Legal Involvement Pertinent to Current Situation/Hospitalization: No - Comment as needed  Activities of Daily Living Home Assistive Devices/Equipment: Walker (specify type) ADL Screening (condition at time of admission) Patient's cognitive ability adequate to safely complete daily activities?: No Is the patient deaf or have difficulty hearing?: No Does the patient have difficulty seeing, even when wearing glasses/contacts?: No Does the patient have difficulty concentrating, remembering, or making decisions?: Yes Patient able to express need for assistance with ADLs?: Yes Does the patient have difficulty dressing or bathing?: Yes Independently performs ADLs?: No Communication: Independent Dressing (OT): Needs assistance Is this a change from baseline?: Pre-admission baseline Grooming: Needs assistance Is this a change from baseline?: Pre-admission baseline Feeding: Needs assistance Is this a change from baseline?: Pre-admission baseline Bathing: Needs assistance Is this a change from baseline?: Pre-admission baseline Toileting: Needs assistance Is this a change from baseline?: Pre-admission baseline In/Out Bed: Needs assistance Is this a change from baseline?: Pre-admission baseline Walks in Home: Needs assistance Is this a change from baseline?: Pre-admission baseline Does the patient have difficulty walking or climbing stairs?: Yes Weakness of Legs: Both Weakness of  Arms/Hands: Both  Permission Sought/Granted Permission sought to share information with : Family Supports    Share Information with NAME: Sherlean Foot @ [phone] or Zendayah Hardgrave @ [phone]           Emotional Assessment Appearance:: Appears stated age Attitude/Demeanor/Rapport: Gracious,  Lethargic Affect (typically observed): Accepting, Quiet, Pleasant Orientation: : Oriented to Self, Oriented to Place, Oriented to  Time, Oriented to Situation Alcohol / Substance Use: Not Applicable Psych Involvement: No (comment)  Admission diagnosis:  Dehydration [E86.0] Lower urinary tract infectious disease [N39.0] Weakness [R53.1] Generalized weakness [R53.1] Essential hypertension [I10] Conjunctivitis of both eyes, unspecified conjunctivitis type [H10.9] Rhabdomyolysis [M62.82] Patient Active Problem List   Diagnosis Date Noted  . Generalized weakness 04/18/2020  . AKI (acute kidney injury) ([place]) 04/18/2020  . Leg swelling 04/18/2020  . Rhabdomyolysis 04/18/2020  . Ambulatory dysfunction 04/18/2020  . Left-sided weakness 03/16/2020  . Weight loss 03/16/2020  . Gait instability 03/16/2020  . Dysthymia 03/16/2020  . Chronic pain of right knee 06/15/2019  . Unilateral primary osteoarthritis, right knee 06/15/2019  . Chronic pain of left knee 05/12/2018  . Unilateral primary osteoarthritis, left knee 05/12/2018  . Essential hypertension 11/16/2015  . Hx of adenomatous colonic polyps 09/27/2014   PCP:  Mack Hook, MD Pharmacy:   [facility] [place], [address] [place] [address] Phone: [phone] Fax: [phone]     Social Determinants of Health (SDOH) Interventions    Readmission Risk Interventions No flowsheet data found.

## 2020-04-19 NOTE — Progress Notes (Signed)
Inpatient Rehab Admissions Coordinator Note:   Per PT recommendations, pt was screened for CIR candidacy by Shann Medal, PT, DPT.  At this time we are recommending an inpatient rehab consult.  I will place an order per our protocol.  Please contact me with questions.   Shann Medal, PT, DPT [phone] 04/19/20 11:59 AM

## 2020-04-19 NOTE — Progress Notes (Addendum)
PROGRESS NOTE    Diane Snow  ZWC:[id number] DOB: [birth year] DOA: 04/17/2020 PCP: Mack Hook, MD    Brief Narrative:  Patient was admitted to the hospital with a working diagnosis of generalized weakness with ambulatory dysfunction, complicated with AKI and rhabdomyolysis.   74 year old female with significant past medical history for hypertension and bilateral lower extremity weakness who presented due to progressive weakness.  Her ambulatory dysfunction and lower extremity weakness had a rapid progression to the point where she had difficulty using her walker. She was referred to neurology as outpatient for the evaluation of possible parkinson's disease.   On the day of admission she was found down leaning backwards, over a chair, no loss of consciousness, but unable to stand up, for at least 8 H. Her family got into her house because she was not answering the phone. She lives alone, and apparently very poor oral intake.    She had outpatient work-up with brain MRI and also she is being referred to neurology due to possible Parkinson's disease. On her initial physical examination blood pressure 156/94, heart rate 79, respiratory rate of 15, oxygen saturation 99%.  Her lungs were clear to auscultation bilaterally, heart S1-S2, present rhythmic, her abdomen was soft, no lower extremity edema.  Her strength was 4/5 upper extremities, 3-4/5 lower extremities. Sodium 144, potassium 4.1, chloride 105, bicarb 23, glucose 146, BUN 29, creatinine 1.3, AST 156, ALT 35, CK 18,250, white count 10.6, hemoglobin 16.5, hematocrit 50.6, platelets 203.  SARS COVID-19 negative.  Urinalysis > 300 protein, specific gravity 1.018, 21-50 white cells, 0-5 red cells.  Urinary sodium 28. CT head with no acute changes.  CT cervical spine no acute changes.  Chest radiograph with hyperinflation, faint left atelectasis. EKG 80 bpm, normal axis, normal intervals, sinus rhythm, no ST segment or T wave  changes.  Patient has been placed on IV fluids and supportive medical care. Her renal function and urine output have been worsening, She continue to have lower extremity paresis, mainly proximal.   Assessment & Plan:   Principal Problem:   Rhabdomyolysis Active Problems:   Essential hypertension   Generalized weakness   AKI (acute kidney injury) ([place])   Leg swelling   Ambulatory dysfunction    1. Rhabdomyolysis (traumatic) complicated with AKI, and non anion gap metabolic acidosis. Patient with ambulatory dysfunction, recent fall, unable to stand for at least 8 H. Follow up Ck is 1,243, worsening renal function with decrease urine output 150 ml over last 24 H and rising cr at 2,17. K is at 3,6 and serum bicarbonate at 19, with Cl 111.   Change fluids to balanced electrolyte solutions with LR at 100 ml to prevent further hyperchloremic acidosis. Placed a foley catheter for accurate urine output monitoring.   Follow on renal panel in am, avoid hypotension and nephrotoxic medications.   2. Ambulatory dysfunction/ possible parkinson's disease. Patient with lower extremity weakness and cogwheel rigidity right upper extremity, decreased facial expression, but no tremors.  Today continue to have lower extremity paresis proximally, distal has 4./5 power. Left facial droop, (chronic). B 12 is 54,309   Old records personally reviewed brain MRI from 03/[facility]), with mild atrophy and mild to moderate gliosis. Will order thoracic and lumbar spine MRI, and will call neurology for consultation.   Continue thiamine, multivitamins and B12 supplementation.   I spoke with Dr. Amil Amen (her primary care). Patient with chronic ambulatory dysfunction but with rapid deterioration over last month. She lives alone and progressive deterioration  in her overall health.   Patient will need inpatient rehabilitation at discharge.   3. HTN. Stable blood pressure, 118/61 mmHg. Echocardiogram with preserved  LV systolic function.   4. Pyuria. Positive wbc in urine analysis but no urinary symptoms, urine culture with no growth, will continue to hold on antibiotic therapy, urine infection has been ruled out.   5. Moderate to severe calorie protein malnutrition. Consulted nutrition for recommendations, continue with ensure   Patient continue to be at high risk for worsening renal function.   Status is: Inpatient  Remains inpatient appropriate because:IV treatments appropriate due to intensity of illness or inability to take PO   Dispo: The patient is from: Home              Anticipated d/c is to: CIR              Anticipated d/c date is: 3 days              Patient currently is not medically stable to d/c.   DVT prophylaxis: Enoxaparin   Code Status:   full  Family Communication:  I spoke with patient's cousin  at the bedside, we talked in detail about patient's condition, plan of care and prognosis and all questions were addressed.   Consults: Neurology consulted.   Subjective: Patient continue to be very weak and deconditioned, she can move her proximal lower extremities, no nausea or vomiting, no chest pain. Had back pain in the past but not currently.   Objective: Vitals:   04/18/20 1746 04/18/20 2104 04/19/20 0438 04/19/20 1352  BP: (!) 130/94 131/84 133/75 118/61  Pulse: 78 75 69 69  Resp:  19 17 18   Temp: 98.3 F (36.8 C) 97.9 F (36.6 C) 98.5 F (36.9 C) 97.8 F (36.6 C)  TempSrc: Oral Oral Oral Axillary  SpO2: 100% 98% 98% 98%  Weight:      Height:        Intake/Output Summary (Last 24 hours) at 04/19/2020 1409 Last data filed at 04/19/2020 1243 Gross per 24 hour  Intake 1571.77 ml  Output 150 ml  Net 1421.77 ml   Filed Weights   04/17/20 2230  Weight: 59 kg    Examination:   General: Not in pain or dyspnea, deconditioned and ill looking appearing  Neurology: mild left facial droop, has 4/5 upper extremities and 1/5 proximal lower extremities/ distal  lower extremities 4/5. Decreased coordination on left upper extremity. No tremors.   E ENT: no pallor, no icterus, oral mucosa moist Cardiovascular: No JVD. S1-S2 present, rhythmic, no gallops, rubs, or murmurs. Non pitting ++  lower extremity edema. Pulmonary: positive breath sounds bilaterally, adequate air movement, no wheezing, rhonchi or rales. Gastrointestinal. Abdomen with, no organomegaly, non tender, no rebound or guarding Skin. No rashes Musculoskeletal: no joint deformities     Data Reviewed: I have personally reviewed following labs and imaging studies  CBC: Recent Labs  Lab 04/17/20 2020  WBC 10.6*  HGB 16.5*  HCT 50.6*  MCV 90.8  PLT 027   Basic Metabolic Panel: Recent Labs  Lab 04/12/20 1631 04/17/20 2020 04/18/20 1301 04/19/20 0346  NA 138 144 142 141  K 3.8 4.1 3.5 3.6  CL 101 105 108 111  CO2 23 23 21* 19*  GLUCOSE 83 146* 167* 103*  BUN 15 29* 42* 51*  CREATININE 0.89 1.33* 1.90* 2.17*  CALCIUM 9.3 9.4 8.5* 8.2*   GFR: Estimated Creatinine Clearance: 17.3 mL/min (A) (by C-G formula based on  SCr of 2.17 mg/dL (H)). Liver Function Tests: Recent Labs  Lab 04/12/20 1631 04/17/20 2020  AST 20 156*  ALT 7 35  ALKPHOS 73 70  BILITOT 0.4 0.9  PROT 7.3 8.6*  ALBUMIN 4.0 3.9   No results for input(s): LIPASE, AMYLASE in the last 168 hours. No results for input(s): AMMONIA in the last 168 hours. Coagulation Profile: No results for input(s): INR, PROTIME in the last 168 hours. Cardiac Enzymes: Recent Labs  Lab 04/18/20 0919 04/19/20 0346  CKTOTAL 18,250* 1,243*   BNP (last 3 results) No results for input(s): PROBNP in the last 8760 hours. HbA1C: No results for input(s): HGBA1C in the last 72 hours. CBG: No results for input(s): GLUCAP in the last 168 hours. Lipid Profile: No results for input(s): CHOL, HDL, LDLCALC, TRIG, CHOLHDL, LDLDIRECT in the last 72 hours. Thyroid Function Tests: No results for input(s): TSH, T4TOTAL, FREET4,  T3FREE, THYROIDAB in the last 72 hours. Anemia Panel: Recent Labs    04/19/20 0346  VITAMINB12 1,309*      Radiology Studies: I have reviewed all of the imaging during this hospital visit personally     Scheduled Meds: . aspirin EC  81 mg Oral Daily  . Chlorhexidine Gluconate Cloth  6 each Topical Daily  . enoxaparin (LOVENOX) injection  30 mg Subcutaneous Q24H  . feeding supplement (ENSURE ENLIVE)  237 mL Oral BID BM  . multivitamin  1 tablet Oral Daily  . sodium chloride flush  10-40 mL Intracatheter Q12H  . thiamine  100 mg Oral Daily  . vitamin B-12  1,000 mcg Oral Daily   Continuous Infusions: . sodium chloride    . sodium chloride 100 mL/hr at 04/19/20 1248     LOS: 1 day        Guillaume Weninger Gerome Apley, MD

## 2020-04-20 ENCOUNTER — Inpatient Hospital Stay (HOSPITAL_COMMUNITY): Payer: Medicare HMO

## 2020-04-20 DIAGNOSIS — L8990 Pressure ulcer of unspecified site, unspecified stage: Secondary | ICD-10-CM | POA: Insufficient documentation

## 2020-04-20 DIAGNOSIS — G8220 Paraplegia, unspecified: Secondary | ICD-10-CM

## 2020-04-20 LAB — BASIC METABOLIC PANEL
Anion gap: 7 (ref 5–15)
BUN: 43 mg/dL — ABNORMAL HIGH (ref 8–23)
CO2: 25 mmol/L (ref 22–32)
Calcium: 7.6 mg/dL — ABNORMAL LOW (ref 8.9–10.3)
Chloride: 107 mmol/L (ref 98–111)
Creatinine, Ser: 1.25 mg/dL — ABNORMAL HIGH (ref 0.44–1.00)
GFR calc Af Amer: 49 mL/min — ABNORMAL LOW (ref >60)
GFR calc non Af Amer: 42 mL/min — ABNORMAL LOW (ref >60)
Glucose, Bld: 92 mg/dL (ref 70–99)
Potassium: 3.3 mmol/L — ABNORMAL LOW (ref 3.5–5.1)
Sodium: 139 mmol/L (ref 135–145)

## 2020-04-20 MED ORDER — ENOXAPARIN SODIUM 40 MG/0.4ML ~~LOC~~ SOLN
40.0000 mg | SUBCUTANEOUS | Status: DC
  Administered 2020-04-21 – 2020-04-23 (×3): 40 mg via SUBCUTANEOUS
  Filled 2020-04-20 (×3): qty 0.4

## 2020-04-20 NOTE — Consult Note (Cosign Needed)
Neurology Consultation  Reason for Consult: Bilateral lower extremity weakness Referring Physician: Dr. Cordelia Poche  CC: Bilateral lower extremity weakness  History is obtained from: Patient and chart  HPI: Diane Snow is a 74 y.o. female with history of hypertension, colon polyps, arthritis and prolonged history of lower extremity weakness which is now worsened.  For this reason neurology was consulted.  Patient states that her lower extremity weakness started greater than a year ago.  She believes it started in the proximal aspect of her lower extremities and slowly extended down.  She feels as though her left leg is weaker than the right.  And over the last 6 months she feels as though she might have some decrease sensation in her lower extremities.  In addition over the last 6 months she started to drag her left leg and needing to use both hands to lift her legs up.  In further conversation she noted that she had fallen in the bathroom back in March.  At that time her gait significantly worsened to the point where she was given a walker.  On a regular basis, she states that she stays in her home, uses the walker to walk from one room to the other.  Majority of the time is laying or sitting down.  Again left leg is weaker than the right.  She also states that now she has noticed some right upper thigh numbness which only occurred a couple days ago.  Per notes her PCP noted some Parkinson-like features including tremor, hypomania, masked facies that concerned her for Parkinson's.  For that reason she has been referred to neurology for a further work-up.  Hospital course  MRI thoracic spine and lumbar spine -no acute abnormality of thoracic or lumbar spine.  Edema in the paraspinal muscle on the left side and lower thoracic in upper lumbar region.  This likely reflects muscle strain.  Mild degenerative changes without significant spinal canal or neuroforaminal stenosis at any level  CT head  shows-no acute intracranial abnormality.  No skull fracture.  Stable atrophy and chronic small vessel ischemia.  Subcutaneous soft tissue edema involving the left side of the face.  Diffuse fluid in the scalp, near circumferential, which may be subgaleal in location measuring up to 11 mm in thickness.  Unknown etiology.  Chart review (no previous neurological notes)  Work up that has been done: Labs of importance: Creatinine 2.7 on 6/22 now 125 Vitamin B12 1309 CRP 10.4 Sed rate 15 CK total initially 18,250 which is trended down to 1243 AST 156 ALT 35    Past Medical History:  Diagnosis Date  . Allergy   . Arthritis   . Hx of adenomatous colonic polyps 09/27/2014  . Hypertension     Family History  Problem Relation Age of Onset  . Kidney disease Mother        cause of death  . Hypertension Mother   . Heart disease Father        CHF cause of death  . Hypertension Father   . Breast cancer Maternal Aunt   . Heart disease Brother        CHF cause of death  . Gout Brother   . Colon cancer Neg Hx   . Rectal cancer Neg Hx   . Stomach cancer Neg Hx   . Esophageal cancer Neg Hx   . Pancreatic cancer Neg Hx     Social History:   reports that she quit smoking about 9 years  ago. Her smoking use included cigarettes. She has a 20.00 pack-year smoking history. She has never used smokeless tobacco. She reports current alcohol use. She reports that she does not use drugs.  Medications  Current Facility-Administered Medications:  .  0.9 %  sodium chloride infusion, 250 mL, Intravenous, PRN, Opyd, Ilene Qua, MD .  acetaminophen (TYLENOL) tablet 650 mg, 650 mg, Oral, Q6H PRN, 650 mg at 04/19/20 1623 **OR** acetaminophen (TYLENOL) suppository 650 mg, 650 mg, Rectal, Q6H PRN, Opyd, Timothy S, MD .  aspirin EC tablet 81 mg, 81 mg, Oral, Daily, Opyd, Ilene Qua, MD, 81 mg at 04/19/20 0920 .  Chlorhexidine Gluconate Cloth 2 % PADS 6 each, 6 each, Topical, Daily, Arrien, Jimmy Picket, MD,  6 each at 04/19/20 1236 .  enoxaparin (LOVENOX) injection 30 mg, 30 mg, Subcutaneous, Q24H, Opyd, Ilene Qua, MD, 30 mg at 04/19/20 0921 .  feeding supplement (ENSURE ENLIVE) (ENSURE ENLIVE) liquid 237 mL, 237 mL, Oral, BID BM, Arrien, Jimmy Picket, MD, 237 mL at 04/19/20 1624 .  lactated ringers infusion, , Intravenous, Continuous, Arrien, Jimmy Picket, MD, Last Rate: 100 mL/hr at 04/20/20 0107, New Bag at 04/20/20 0107 .  multivitamin (PROSIGHT) tablet 1 tablet, 1 tablet, Oral, Daily, Arrien, Jimmy Picket, MD, 1 tablet at 04/19/20 [phone] .  ondansetron (ZOFRAN) tablet 4 mg, 4 mg, Oral, Q6H PRN **OR** ondansetron (ZOFRAN) injection 4 mg, 4 mg, Intravenous, Q6H PRN, Opyd, Timothy S, MD .  senna-docusate (Senokot-S) tablet 1 tablet, 1 tablet, Oral, QHS PRN, Opyd, Timothy S, MD .  sodium chloride flush (NS) 0.9 % injection 10-40 mL, 10-40 mL, Intracatheter, Q12H, Arrien, Jimmy Picket, MD, 10 mL at 04/19/20 2212 .  sodium chloride flush (NS) 0.9 % injection 10-40 mL, 10-40 mL, Intracatheter, PRN, Arrien, Jimmy Picket, MD .  thiamine tablet 100 mg, 100 mg, Oral, Daily, Arrien, Jimmy Picket, MD, 100 mg at 04/19/20 0923 .  vitamin B-12 (CYANOCOBALAMIN) tablet 1,000 mcg, 1,000 mcg, Oral, Daily, Opyd, Ilene Qua, MD, 1,000 mcg at 04/19/20 0924  ROS:    General ROS: negative for - chills, fatigue, fever, night sweats, weight gain or weight loss Psychological ROS: negative for - behavioral disorder, hallucinations, memory difficulties, mood swings or suicidal ideation Ophthalmic ROS: negative for - blurry vision, double vision, eye pain or loss of vision ENT ROS: negative for - epistaxis, nasal discharge, oral lesions, sore throat, tinnitus or vertigo Allergy and Immunology ROS: negative for - hives or itchy/watery eyes Hematological and Lymphatic ROS: negative for - bleeding problems, bruising or swollen lymph nodes Endocrine ROS: negative for - galactorrhea, hair pattern changes,  polydipsia/polyuria or temperature intolerance Respiratory ROS: negative for - cough, hemoptysis, shortness of breath or wheezing Cardiovascular ROS: negative for - chest pain, dyspnea on exertion, edema or irregular heartbeat Gastrointestinal ROS: negative for - abdominal pain, diarrhea, hematemesis, nausea/vomiting or stool incontinence Genito-Urinary ROS: negative for - dysuria, hematuria, incontinence or urinary frequency/urgency Musculoskeletal ROS: positive for - joint pain, stiffness and LE weakness bilateral, --no pain with palpation of muscles Neurological ROS: as noted in HPI Dermatological ROS: negative for rash and skin lesion changes  Exam: Current vital signs: BP (!) 144/66 (BP Location: Left Arm)   Pulse 65   Temp 97.8 F (36.6 C) (Oral)   Resp 14   Ht '4\' 10"'  (1.473 m)   Wt 64.8 kg   SpO2 98%   BMI 29.86 kg/m  Vital signs in last 24 hours: Temp:  [97.8 F (36.6 C)-99 F (37.2 C)] 97.8 F (  36.6 C) (06/23 0430) Pulse Rate:  [65-70] 65 (06/23 0430) Resp:  [14-20] 14 (06/23 0430) BP: (118-144)/(59-75) 144/66 (06/23 0430) SpO2:  [94 %-98 %] 98 % (06/23 0430) Weight:  [64.8 kg] 64.8 kg (06/23 0500)   Constitutional: Appears well-developed and well-nourished.  Psych: Affect appropriate to situation Eyes: No scleral injection HENT: No OP obstrucion Head: Normocephalic.  Cardiovascular: Normal rate and regular rhythm.  Respiratory: Effort normal, non-labored breathing GI: Soft.  No distension. There is no tenderness.  Skin: WDI  Neuro: Cranial Nerves: II: visual fields grossly normal, pupils equal, round, reactive to light and accommodation III,IV, VI: ptosis right eye, extraocular muscles extra-ocular motions intact bilaterally V,VII: left facial droop, facial light touch sensation normal bilaterally VIII: hearing normal bilaterally IX,X: gag reflex present XI: trapezius strength/neck flexion strength normal bilaterally XII: tongue strength normal   Motor: Right : Upper extremity    Left:     Upper extremity 4/5 deltoid       4/5 deltoid 5/5 tricep      /5 tricep 5/5 biceps      5/5 biceps  5/5wrist flexion     5/5 wrist flexion 5/5 wrist extension     5/5 wrist extension 5/5 hand grip      5/5 hand grip --left wrist cogwheeling   Lower extremity     Lower extremity 0/5 hip flexor      0/5 hip flexor 0/5 hip adductors     0/5 hip adductors 0/5 hip abductors     0/5 hip abductors 0/5 quadricep      0/5 quadriceps  0/5 hamstrings     0/5 hamstrings 5/5 plantar flexion       5/5 plantar flexion 5/5 plantar extension     5/5 plantar extension  Significant immobility ob bilateral hips with wasting of bilateral quadriceps, edema in bilateral shins Sensory: Pinprick and light touch intact throughout, bilaterally Deep Tendon Reflexes:  Right: Upper Extremity   Left: Upper extremity   biceps (C-5 to C-6) 2/4   biceps (C-5 to C-6) 2/4 tricep (C7) 2/4    triceps (C7) 2/4 Brachioradialis (C6) 2/4  Brachioradialis (C6) 2/4  Lower Extremity Lower Extremity  quadriceps (L-2 to L-4) 0/4   quadriceps (L-2 to L-4) 2/4 Achilles (S1) 2/4   Achilles (S1) 2/4 Plantars: Right: downgoing   Left: downgoing Cerebellar: normal finger-to-nose  Labs I have reviewed labs in epic and the results pertinent to this consultation are:   CBC    Component Value Date/Time   WBC 10.6 (H) 04/17/2020 2020   RBC 5.57 (H) 04/17/2020 2020   HGB 16.5 (H) 04/17/2020 2020   HGB 13.3 03/22/2020 1458   HCT 50.6 (H) 04/17/2020 2020   HCT 39.4 03/22/2020 1458   PLT 203 04/17/2020 2020   PLT 196 03/22/2020 1458   MCV 90.8 04/17/2020 2020   MCV 87 03/22/2020 1458   MCH 29.6 04/17/2020 2020   MCHC 32.6 04/17/2020 2020   RDW 13.6 04/17/2020 2020   RDW 12.3 03/22/2020 1458   LYMPHSABS 1.5 03/22/2020 1458   EOSABS 0.0 03/22/2020 1458   BASOSABS 0.0 03/22/2020 1458    CMP     Component Value Date/Time   NA 139 04/20/2020 0752   NA 138 04/12/2020 1631    K 3.3 (L) 04/20/2020 0752   CL 107 04/20/2020 0752   CO2 25 04/20/2020 0752   GLUCOSE 92 04/20/2020 0752   BUN 43 (H) 04/20/2020 0752   BUN 15 04/12/2020 1631  CREATININE 1.25 (H) 04/20/2020 0752   CALCIUM 7.6 (L) 04/20/2020 0752   PROT 8.6 (H) 04/17/2020 2020   PROT 7.3 04/12/2020 1631   ALBUMIN 3.9 04/17/2020 2020   ALBUMIN 4.0 04/12/2020 1631   AST 156 (H) 04/17/2020 2020   ALT 35 04/17/2020 2020   ALKPHOS 70 04/17/2020 2020   BILITOT 0.9 04/17/2020 2020   BILITOT 0.4 04/12/2020 1631   GFRNONAA 42 (L) 04/20/2020 0752   GFRAA 49 (L) 04/20/2020 0752    Lipid Panel  No results found for: CHOL, TRIG, HDL, CHOLHDL, VLDL, LDLCALC, LDLDIRECT   Imaging I have reviewed the images obtained:  MRI thoracic spine and lumbar spine -no acute abnormality of thoracic or lumbar spine.  Edema in the paraspinal muscle on the left side and lower thoracic in upper lumbar region.  This likely reflects muscle strain.  Mild degenerative changes without significant spinal canal or neuroforaminal stenosis at any level  CT head shows-no acute intracranial abnormality.  No skull fracture.  Stable atrophy and chronic small vessel ischemia.  Subcutaneous soft tissue edema involving the left side of the face.  Diffuse fluid in the scalp, near circumferential, which may be subgaleal in location measuring up to 11 mm in thickness.  Unknown etiology.   Etta Quill PA-C [facility] [phone]  M-F  (9:00 am- 5:00 PM)  04/20/2020, 9:05 AM     Assessment:  This is a 74 year old female with greater than 1 year history of ambulatory difficulty.  After fall in March she had noticed a significant decline in her ambulation, requiring a walker and describe-shuffling steps.  On day of admission patient was found down leaning backwards, over chair unable to stand up for at least 8 hours.  Now suffering from significant weakness in her lower extremities, along with significantly elevated muscle tone  in her hips.  At this point I believe this is a acute on chronic issue.  I also believe given the fact that she suffered from rhabdomyolysis this very well could be the cause of her significant muscle weakness at this time.  The other differential would be a possible myositis.  Other possibility is the fact that patient has significant gait disturbance/deconditioning now post fall enhancing patient's bilateral lower extremity weakness.  Impression: -Rhabdomyolysis leading to severe increase in muscle weakness given her elevated AST, elevated CK, and being found for prolonged period of time -Bilateral lower extremity weakness, chronic with worsening after fall in March  Recommendations: -EMG nerve conduction would be helpful however unable to obtain in hospital.  If not improving while in hospital would definitely recommend this as an outpatient -Continue hydration for rhabdomyolysis, elevated CK, elevated AST -MRI of brain and C-spine -Physical therapy -If no improvement while in hospital may require a muscle biopsy however will hold off at this time   Dr. Lorraine Lax to see patient make further recommendations.

## 2020-04-20 NOTE — Progress Notes (Signed)
PT Cancellation Note  Patient Details Name: Diane Snow MRN: [id number] DOB: 07-27-1946   Cancelled Treatment:    Reason Eval/Treat Not Completed: Patient at procedure or test/unavailable Pt eating and then out for MRI.  Will f/u as able. Abran Richard, PT Acute Rehab Services Pager [phone] [facility] [place] 04/20/2020, 4:59 PM

## 2020-04-20 NOTE — Consult Note (Addendum)
Neurology Consultation  Reason for Consult: Bilateral lower extremity weakness Referring Physician: Dr. Cordelia Poche  CC: Bilateral lower extremity weakness  History is obtained from: Patient and chart  HPI: Diane Snow is a 74 y.o. female with history of hypertension, colon polyps, arthritis and prolonged history of lower extremity weakness which is now worsened.  For this reason neurology was consulted.  Patient states that her lower extremity weakness started greater than a year ago.  She believes it started in the proximal aspect of her lower extremities and slowly extended down.  She feels as though her left leg is weaker than the right.  And over the last 6 months she feels as though she might have some decrease sensation in her lower extremities.  In addition over the last 6 months she started to drag her left leg and needing to use both hands to lift her legs up.  In further conversation she noted that she had fallen in the bathroom back in March.  At that time her gait significantly worsened to the point where she was given a walker.  On a regular basis, she states that she stays in her home, uses the walker to walk from one room to the other.  Majority of the time is laying or sitting down.  Again left leg is weaker than the right.  She also states that now she has noticed some right upper thigh numbness which only occurred a couple days ago.  Per notes her PCP noted some Parkinson-like features including tremor, hypomania, masked facies that concerned her for Parkinson's.  For that reason she has been referred to neurology for a further work-up.  Hospital course  MRI thoracic spine and lumbar spine -no acute abnormality of thoracic or lumbar spine.  Edema in the paraspinal muscle on the left side and lower thoracic in upper lumbar region.  This likely reflects muscle strain.  Mild degenerative changes without significant spinal canal or neuroforaminal stenosis at any level  CT head  shows-no acute intracranial abnormality.  No skull fracture.  Stable atrophy and chronic small vessel ischemia.  Subcutaneous soft tissue edema involving the left side of the face.  Diffuse fluid in the scalp, near circumferential, which may be subgaleal in location measuring up to 11 mm in thickness.  Unknown etiology.  Chart review (no previous neurological notes)  Work up that has been done: Labs of importance: Creatinine 2.7 on 6/22 now 125 Vitamin B12 1309 CRP 10.4 Sed rate 15 CK total initially 18,250 which is trended down to 1243 AST 156 ALT 35    Past Medical History:  Diagnosis Date  . Allergy   . Arthritis   . Hx of adenomatous colonic polyps 09/27/2014  . Hypertension     Family History  Problem Relation Age of Onset  . Kidney disease Mother        cause of death  . Hypertension Mother   . Heart disease Father        CHF cause of death  . Hypertension Father   . Breast cancer Maternal Aunt   . Heart disease Brother        CHF cause of death  . Gout Brother   . Colon cancer Neg Hx   . Rectal cancer Neg Hx   . Stomach cancer Neg Hx   . Esophageal cancer Neg Hx   . Pancreatic cancer Neg Hx     Social History:   reports that she quit smoking about 9 years  ago. Her smoking use included cigarettes. She has a 20.00 pack-year smoking history. She has never used smokeless tobacco. She reports current alcohol use. She reports that she does not use drugs.  Medications  Current Facility-Administered Medications:  .  0.9 %  sodium chloride infusion, 250 mL, Intravenous, PRN, Opyd, Ilene Qua, MD .  acetaminophen (TYLENOL) tablet 650 mg, 650 mg, Oral, Q6H PRN, 650 mg at 04/19/20 1623 **OR** acetaminophen (TYLENOL) suppository 650 mg, 650 mg, Rectal, Q6H PRN, Opyd, Timothy S, MD .  aspirin EC tablet 81 mg, 81 mg, Oral, Daily, Opyd, Ilene Qua, MD, 81 mg at 04/19/20 0920 .  Chlorhexidine Gluconate Cloth 2 % PADS 6 each, 6 each, Topical, Daily, Arrien, Jimmy Picket, MD,  6 each at 04/19/20 1236 .  enoxaparin (LOVENOX) injection 30 mg, 30 mg, Subcutaneous, Q24H, Opyd, Ilene Qua, MD, 30 mg at 04/19/20 0921 .  feeding supplement (ENSURE ENLIVE) (ENSURE ENLIVE) liquid 237 mL, 237 mL, Oral, BID BM, Arrien, Jimmy Picket, MD, 237 mL at 04/19/20 1624 .  lactated ringers infusion, , Intravenous, Continuous, Arrien, Jimmy Picket, MD, Last Rate: 100 mL/hr at 04/20/20 0107, New Bag at 04/20/20 0107 .  multivitamin (PROSIGHT) tablet 1 tablet, 1 tablet, Oral, Daily, Arrien, Jimmy Picket, MD, 1 tablet at 04/19/20 [phone] .  ondansetron (ZOFRAN) tablet 4 mg, 4 mg, Oral, Q6H PRN **OR** ondansetron (ZOFRAN) injection 4 mg, 4 mg, Intravenous, Q6H PRN, Opyd, Timothy S, MD .  senna-docusate (Senokot-S) tablet 1 tablet, 1 tablet, Oral, QHS PRN, Opyd, Timothy S, MD .  sodium chloride flush (NS) 0.9 % injection 10-40 mL, 10-40 mL, Intracatheter, Q12H, Arrien, Jimmy Picket, MD, 10 mL at 04/19/20 2212 .  sodium chloride flush (NS) 0.9 % injection 10-40 mL, 10-40 mL, Intracatheter, PRN, Arrien, Jimmy Picket, MD .  thiamine tablet 100 mg, 100 mg, Oral, Daily, Arrien, Jimmy Picket, MD, 100 mg at 04/19/20 0923 .  vitamin B-12 (CYANOCOBALAMIN) tablet 1,000 mcg, 1,000 mcg, Oral, Daily, Opyd, Ilene Qua, MD, 1,000 mcg at 04/19/20 0924  ROS:    General ROS: negative for - chills, fatigue, fever, night sweats, weight gain or weight loss Psychological ROS: negative for - behavioral disorder, hallucinations, memory difficulties, mood swings or suicidal ideation Ophthalmic ROS: negative for - blurry vision, double vision, eye pain or loss of vision ENT ROS: negative for - epistaxis, nasal discharge, oral lesions, sore throat, tinnitus or vertigo Allergy and Immunology ROS: negative for - hives or itchy/watery eyes Hematological and Lymphatic ROS: negative for - bleeding problems, bruising or swollen lymph nodes Endocrine ROS: negative for - galactorrhea, hair pattern changes,  polydipsia/polyuria or temperature intolerance Respiratory ROS: negative for - cough, hemoptysis, shortness of breath or wheezing Cardiovascular ROS: negative for - chest pain, dyspnea on exertion, edema or irregular heartbeat Gastrointestinal ROS: negative for - abdominal pain, diarrhea, hematemesis, nausea/vomiting or stool incontinence Genito-Urinary ROS: negative for - dysuria, hematuria, incontinence or urinary frequency/urgency Musculoskeletal ROS: positive for - joint pain, stiffness and LE weakness bilateral, --no pain with palpation of muscles Neurological ROS: as noted in HPI Dermatological ROS: negative for rash and skin lesion changes  Exam: Current vital signs: BP (!) 144/66 (BP Location: Left Arm)   Pulse 65   Temp 97.8 F (36.6 C) (Oral)   Resp 14   Ht '4\' 10"'  (1.473 m)   Wt 64.8 kg   SpO2 98%   BMI 29.86 kg/m  Vital signs in last 24 hours: Temp:  [97.8 F (36.6 C)-99 F (37.2 C)] 97.8 F (  36.6 C) (06/23 0430) Pulse Rate:  [65-70] 65 (06/23 0430) Resp:  [14-20] 14 (06/23 0430) BP: (118-144)/(59-75) 144/66 (06/23 0430) SpO2:  [94 %-98 %] 98 % (06/23 0430) Weight:  [64.8 kg] 64.8 kg (06/23 0500)   Constitutional: Appears well-developed and well-nourished.  Psych: Affect appropriate to situation Eyes: No scleral injection HENT: No OP obstrucion Head: Normocephalic.  Cardiovascular: Normal rate and regular rhythm.  Respiratory: Effort normal, non-labored breathing GI: Soft.  No distension. There is no tenderness.  Skin: WDI  Neuro: Cranial Nerves: II: visual fields grossly normal, pupils equal, round, reactive to light and accommodation III,IV, VI: ptosis right eye, extraocular muscles extra-ocular motions intact bilaterally V,VII: left facial droop, facial light touch sensation normal bilaterally VIII: hearing normal bilaterally IX,X: gag reflex present XI: trapezius strength/neck flexion strength normal bilaterally XII: tongue strength normal   Motor: Right : Upper extremity    Left:     Upper extremity 4/5 deltoid       4/5 deltoid 5/5 tricep      /5 tricep 5/5 biceps      5/5 biceps  5/5wrist flexion     5/5 wrist flexion 5/5 wrist extension     5/5 wrist extension 5/5 hand grip      5/5 hand grip --left wrist cogwheeling   Lower extremity     Lower extremity 0/5 hip flexor      0/5 hip flexor 0/5 hip adductors     0/5 hip adductors 0/5 hip abductors     0/5 hip abductors 0/5 quadricep      0/5 quadriceps  0/5 hamstrings     0/5 hamstrings 5/5 plantar flexion       5/5 plantar flexion 5/5 plantar extension     5/5 plantar extension  Significant immobility ob bilateral hips with wasting of bilateral quadriceps, edema in bilateral shins Sensory: Pinprick and light touch intact throughout, bilaterally Deep Tendon Reflexes:  Right: Upper Extremity   Left: Upper extremity   biceps (C-5 to C-6) 2/4   biceps (C-5 to C-6) 2/4 tricep (C7) 2/4    triceps (C7) 2/4 Brachioradialis (C6) 2/4  Brachioradialis (C6) 2/4  Lower Extremity Lower Extremity  quadriceps (L-2 to L-4) 0/4   quadriceps (L-2 to L-4) 2/4 Achilles (S1) 2/4   Achilles (S1) 2/4 Plantars: Right: downgoing   Left: downgoing Cerebellar: normal finger-to-nose  Labs I have reviewed labs in epic and the results pertinent to this consultation are:   CBC    Component Value Date/Time   WBC 10.6 (H) 04/17/2020 2020   RBC 5.57 (H) 04/17/2020 2020   HGB 16.5 (H) 04/17/2020 2020   HGB 13.3 03/22/2020 1458   HCT 50.6 (H) 04/17/2020 2020   HCT 39.4 03/22/2020 1458   PLT 203 04/17/2020 2020   PLT 196 03/22/2020 1458   MCV 90.8 04/17/2020 2020   MCV 87 03/22/2020 1458   MCH 29.6 04/17/2020 2020   MCHC 32.6 04/17/2020 2020   RDW 13.6 04/17/2020 2020   RDW 12.3 03/22/2020 1458   LYMPHSABS 1.5 03/22/2020 1458   EOSABS 0.0 03/22/2020 1458   BASOSABS 0.0 03/22/2020 1458    CMP     Component Value Date/Time   NA 139 04/20/2020 0752   NA 138 04/12/2020 1631    K 3.3 (L) 04/20/2020 0752   CL 107 04/20/2020 0752   CO2 25 04/20/2020 0752   GLUCOSE 92 04/20/2020 0752   BUN 43 (H) 04/20/2020 0752   BUN 15 04/12/2020 1631  CREATININE 1.25 (H) 04/20/2020 0752   CALCIUM 7.6 (L) 04/20/2020 0752   PROT 8.6 (H) 04/17/2020 2020   PROT 7.3 04/12/2020 1631   ALBUMIN 3.9 04/17/2020 2020   ALBUMIN 4.0 04/12/2020 1631   AST 156 (H) 04/17/2020 2020   ALT 35 04/17/2020 2020   ALKPHOS 70 04/17/2020 2020   BILITOT 0.9 04/17/2020 2020   BILITOT 0.4 04/12/2020 1631   GFRNONAA 42 (L) 04/20/2020 0752   GFRAA 49 (L) 04/20/2020 0752    Lipid Panel  No results found for: CHOL, TRIG, HDL, CHOLHDL, VLDL, LDLCALC, LDLDIRECT   Imaging I have reviewed the images obtained:  MRI thoracic spine and lumbar spine -no acute abnormality of thoracic or lumbar spine.  Edema in the paraspinal muscle on the left side and lower thoracic in upper lumbar region.  This likely reflects muscle strain.  Mild degenerative changes without significant spinal canal or neuroforaminal stenosis at any level  CT head shows-no acute intracranial abnormality.  No skull fracture.  Stable atrophy and chronic small vessel ischemia.  Subcutaneous soft tissue edema involving the left side of the face.  Diffuse fluid in the scalp, near circumferential, which may be subgaleal in location measuring up to 11 mm in thickness.  Unknown etiology.   Etta Quill PA-C [facility] [phone]  M-F  (9:00 am- 5:00 PM)  04/20/2020, 9:05 AM     Assessment:  This is a 74 year old female with greater than 1 year history of ambulatory difficulty.  After fall in March she had noticed a significant decline in her ambulation, requiring a walker and describe-shuffling steps.  On day of admission patient was found down leaning backwards, over chair unable to stand up for at least 8 hours.  Now suffering from significant weakness in her lower extremities, along with significantly elevated muscle tone  in her hips.  At this point I believe this is a acute on chronic issue.        NEUROHOSPITALIST ADDENDUM Performed a face to face diagnostic evaluation.    74 year old female with history of hypertension, arthritis and chronic left leg weakness since 2019 presents to the emergency department after being found on the floor. Presented with rhabdomyolysis and AKI and on examination has significant weakness in bilateral lower extremity proximal greater than distal muscles.  Neurology consulted for further evaluation. Her gait difficulty apparently has been chronic and there was some plan for PCP to refer to a neurologist for possible parkinsonism.  The patient herself is a poor historian states that all she can recollect is being on the floor.  She also states that she has chronic left leg weakness since 2019 and drags her feet, but is much stronger than this can usually walk with a cane.  On examination, patient has left facial droop on cranial nerve testing.  She is alert and oriented x3.  Her motor exam: There is increased tone in the left upper extremity compared to the right.  Motor strength in the left lower extremity is approximately 1/5 strength in hip flexion and extension, adduction and abduction, approximately 2 - to 3/5 knee flexion extension and approximately 3+/4 to plantar and dorsiflexion.  In the right leg she has 2/5 strength in hip flexion, 1/5 to abduction and adduction, 2/5 to knee extension and flexion and 4/5 to plantar and dorsiflexion.  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  Other rare considerations include: Paraneoplastic syndrome, mononeuritis multiplex, polymyositis   Recommendations: --ESR, CRP --MRI of the brain and C-spine to evaluate for acute stroke or chronic stroke, compressive myelopathy -EMG nerve conduction would be helpful however unable to obtain in hospital.  If not improving while in hospital would definitely recommend this as an outpatient -Continue hydration for rhabdomyolysis, elevated CK, elevated AST -Physical therapy --CK trends up consider muscle biopsy  Ajahni Nay MD [facility] [phone]   If 7pm to 7am, please call on call as listed on AMION.

## 2020-04-20 NOTE — Progress Notes (Signed)
PROGRESS NOTE    Diane Snow  EHU:[id number] DOB: [birth year] DOA: 04/17/2020 PCP: Mack Hook, MD   Brief Narrative: Diane Snow is a 74 y.o. female with a history of hypertension, LE edema. She presented after being found down on a chair. She was found to have evidence of rhabdomyolysis and resultant AKI. She currently has persistent LE weakness.   Assessment & Plan:   Principal Problem:   Rhabdomyolysis Active Problems:   Essential hypertension   Generalized weakness   AKI (acute kidney injury) ([place])   Leg swelling   Ambulatory dysfunction   Rhabdomyolysis (non-traumatic) Patient found in awkward positioning at home. Patient was in this position for several hours, hanging of a chair sideways with her back on the seat. CK on admission of 18k and has not significantly improved with IV fluids. Associated AKI which has also improved. -IV fluids  AKI Baseline creatinine of 0.89. Creatinine of 1.33 on admission with a peak of 2.17. improving.  Lower extremity weakness Possibly in relation to rhabdomyolysis. However, there is concern for possible neurologic etiology. Thoracic and lumbar spine unrevealing for etiology. Neurology consulted and recommending MRI cervical spine and brain which are pending. -Neurology recommendations  Possibly Parkinson disease Patient with outpatient neurology follow-up in July.  Essential hypertension Mostly controlled.  Lower extremity swelling Initial concern for possible cardiac etiology. Transthoracic Echocardiogram significant for LVH and grade 1 diastolic dysfunction. According to patient, swelling is stable for many years. -Elevate legs as much as able  Pyuria No evidence of UTI. No symptoms.  Malnutrition Unknown severity.  -Continue Ensure  Pressure injury Left buttock, stage 2, POA    DVT prophylaxis: Lovenox Code Status:   Code Status: Full Code Family Communication: Daughter in law at bedside and cousin on  telephone Disposition Plan: Discharge to SNF ([facility] declined admission) in several days pending ability to ambulate and neurology recommendations   Consultants:   Neurology  Procedures:   TRANSTHORACIC ECHOCARDIOGRAM (04/18/2020) IMPRESSIONS    1. Hyperdynamic LV systolic function; moderate LVH; intracavitary  gradient of 2.3 m/s likely due to hyperdynamic function; moderate LVH;  grade 1 diastolic dysfunction.  2. Left ventricular ejection fraction, by estimation, is >75%. The left  ventricle has hyperdynamic function. The left ventricle has no regional  wall motion abnormalities. There is moderate left ventricular hypertrophy.  Left ventricular diastolic  parameters are consistent with Grade I diastolic dysfunction (impaired  relaxation).  3. Right ventricular systolic function is normal. The right ventricular  size is normal.  4. The mitral valve is normal in structure. No evidence of mitral valve  regurgitation. No evidence of mitral stenosis.  5. The aortic valve is tricuspid. Aortic valve regurgitation is not  visualized. Mild aortic valve sclerosis is present, with no evidence of  aortic valve stenosis.  6. The inferior vena cava is normal in size with greater than 50%  respiratory variability, suggesting right atrial pressure of 3 mmHg.   Antimicrobials:  Ceftriaxone   Subjective: Right thigh numbness that radiates to her leg. Numbness is medial  Objective: Vitals:   04/19/20 2030 04/20/20 0035 04/20/20 0430 04/20/20 0500  BP: 130/67 127/75 (!) 144/66   Pulse: 70 70 65   Resp: 16 14 14    Temp: 98.4 F (36.9 C) 98.4 F (36.9 C) 97.8 F (36.6 C)   TempSrc: Oral Oral Oral   SpO2: 96% 97% 98%   Weight:    64.8 kg  Height:        Intake/Output Summary (Last  24 hours) at 04/20/2020 1203 Last data filed at 04/20/2020 0513 Gross per 24 hour  Intake 2733.01 ml  Output 750 ml  Net 1983.01 ml   Filed Weights   04/17/20 2230 04/20/20 0500  Weight: 59 kg  64.8 kg    Examination:  General exam: Appears calm and comfortable Respiratory system: Clear to auscultation. Respiratory effort normal. Cardiovascular system: S1 & S2 heard, RRR. 2/6 systolic murmur Gastrointestinal system: Abdomen is nondistended, soft and nontender. No organomegaly or masses felt. Normal bowel sounds heard. Central nervous system: Alert and oriented. LE weakness and numbness Musculoskeletal: 2+ BLE edema. No calf tenderness Skin: No cyanosis. No rashes Psychiatry: Judgement and insight appear normal. Mood & affect appropriate.     Data Reviewed: I have personally reviewed following labs and imaging studies  CBC Lab Results  Component Value Date   WBC 10.6 (H) 04/17/2020   RBC 5.57 (H) 04/17/2020   HGB 16.5 (H) 04/17/2020   HCT 50.6 (H) 04/17/2020   MCV 90.8 04/17/2020   MCH 29.6 04/17/2020   PLT 203 04/17/2020   MCHC 32.6 04/17/2020   RDW 13.6 04/17/2020   LYMPHSABS 1.5 03/22/2020   EOSABS 0.0 03/22/2020   BASOSABS 0.0 92/33/0076     Last metabolic panel Lab Results  Component Value Date   NA 139 04/20/2020   K 3.3 (L) 04/20/2020   CL 107 04/20/2020   CO2 25 04/20/2020   BUN 43 (H) 04/20/2020   CREATININE 1.25 (H) 04/20/2020   GLUCOSE 92 04/20/2020   GFRNONAA 42 (L) 04/20/2020   GFRAA 49 (L) 04/20/2020   CALCIUM 7.6 (L) 04/20/2020   PROT 8.6 (H) 04/17/2020   ALBUMIN 3.9 04/17/2020   LABGLOB 3.3 04/12/2020   AGRATIO 1.2 04/12/2020   BILITOT 0.9 04/17/2020   ALKPHOS 70 04/17/2020   AST 156 (H) 04/17/2020   ALT 35 04/17/2020   ANIONGAP 7 04/20/2020    CBG (last 3)  No results for input(s): GLUCAP in the last 72 hours.   GFR: Estimated Creatinine Clearance: 31.5 mL/min (A) (by C-G formula based on SCr of 1.25 mg/dL (H)).  Coagulation Profile: No results for input(s): INR, PROTIME in the last 168 hours.  Recent Results (from the past 240 hour(s))  Urine culture     Status: None   Collection Time: 04/18/20 12:07 AM   Specimen:  Urine, Random  Result Value Ref Range Status   Specimen Description   Final    URINE, RANDOM Performed at [facility] [address], [place], [place] [address]    Special Requests   Final    NONE Performed at [facility], [place] [address] [place], [place] [address]    Culture   Final    NO GROWTH Performed at [facility], [place] [address], [place], [place] [address]    Report Status 04/19/2020 FINAL  Final  SARS Coronavirus 2 by RT PCR (hospital order, performed in [facility] hospital lab) Nasopharyngeal Nasopharyngeal Swab     Status: None   Collection Time: 04/18/20  2:05 AM   Specimen: Nasopharyngeal Swab  Result Value Ref Range Status   SARS Coronavirus 2 NEGATIVE NEGATIVE Final    Comment: (NOTE) SARS-CoV-2 target nucleic acids are NOT DETECTED.  The SARS-CoV-2 RNA is generally detectable in upper and lower respiratory specimens during the acute phase of infection. The lowest concentration of SARS-CoV-2 viral copies this assay can detect is 250 copies / mL. A negative result does not preclude SARS-CoV-2  infection and should not be used as the sole basis for treatment or other patient management decisions.  A negative result may occur with improper specimen collection / handling, submission of specimen other than nasopharyngeal swab, presence of viral mutation(s) within the areas targeted by this assay, and inadequate number of viral copies (<250 copies / mL). A negative result must be combined with clinical observations, patient history, and epidemiological information.  Fact Sheet for Patients:   StrictlyIdeas.no  Fact Sheet for Healthcare Providers: BankingDealers.co.za  This test is not yet approved or  cleared by the [place] FDA and has been authorized for detection and/or diagnosis of SARS-CoV-2 by FDA under an Emergency Use Authorization (EUA).  This EUA will  remain in effect (meaning this test can be used) for the duration of the COVID-19 declaration under Section 564(b)(1) of the Act, 21 U.S.C. section 360bbb-3(b)(1), unless the authorization is terminated or revoked sooner.  Performed at [facility], [place] [address] [place], [place] [address]         Radiology Studies: MR THORACIC SPINE WO CONTRAST  Result Date: 04/20/2020 CLINICAL DATA:  Myelopathy, acute, progressive. EXAM: MRI THORACIC AND LUMBAR SPINE WITHOUT CONTRAST TECHNIQUE: Multiplanar and multiecho pulse sequences of the thoracic and lumbar spine were obtained without intravenous contrast. COMPARISON:  None. FINDINGS: Study partially degraded by motion. MRI THORACIC SPINE FINDINGS Alignment:  Physiologic. Vertebrae: No fracture, evidence of discitis, or bone lesion. Endplate degenerative changes are noted at T9-10. Cord:  Normal signal and morphology. Paraspinal and other soft tissues: Edema in the paraspinal musculature on the left side in the lower thoracic and upper lumbar region. Disc levels: Small posterior disc protrusions and mild facet degenerative changes at T7-8, T8-9, T9-10 and T10-11 without significant spinal canal or neural foraminal stenosis. No significant disc bulge or herniation, spinal canal or neural foraminal stenosis and remaining thoracic levels. MRI LUMBAR SPINE FINDINGS Segmentation:  Standard. Alignment: Minimal anterolisthesis of L4 over L5 related to facet degeneration. Vertebrae: No fracture, evidence of discitis, or bone lesion. Hemangioma in the L1 vertebral body. Conus medullaris and cauda equina: Conus extends to the L1 level. Conus and cauda equina appear normal. Paraspinal and other soft tissues: Edema in the paraspinal musculature on the left side in the lower thoracic and upper lumbar region. 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 Disc levels: T12-L1: No spinal canal or neural foraminal  stenosis. L1-2: Facet degenerative changes without significant spinal canal or neural foraminal stenosis. L2-3: Facet degenerative changes without significant spinal canal or neural foraminal stenosis. L3-4: Shallow disc bulge, facet degenerative changes and ligamentum flavum redundancy without significant spinal canal or neural foraminal stenosis. L4-5: Shallow disc bulge, prominent facet degenerative changes, left greater than right, and ligamentum flavum redundancy resulting in mild spinal canal stenosis. No significant neural foraminal narrowing. L5-S1: Prominent facet degenerative changes. No significant spinal canal or neural foraminal stenosis IMPRESSION: 1. No acute abnormality of the thoracic or lumbar spine. 2. Edema in the paraspinal musculature on the left side in the lower thoracic and upper lumbar region. This likely reflects a muscle strain. 3. Mild degenerative changes without significant spinal canal or neural foraminal stenosis at any level. Electronically Signed   By: Pedro Earls M.D.   On: 04/20/2020 08:57   MR LUMBAR SPINE WO CONTRAST  Result Date: 04/20/2020 CLINICAL DATA:  Myelopathy, acute, progressive. EXAM: MRI THORACIC AND LUMBAR SPINE WITHOUT CONTRAST TECHNIQUE: Multiplanar and multiecho pulse  sequences of the thoracic and lumbar spine were obtained without intravenous contrast. COMPARISON:  None. FINDINGS: Study partially degraded by motion. MRI THORACIC SPINE FINDINGS Alignment:  Physiologic. Vertebrae: No fracture, evidence of discitis, or bone lesion. Endplate degenerative changes are noted at T9-10. Cord:  Normal signal and morphology. Paraspinal and other soft tissues: Edema in the paraspinal musculature on the left side in the lower thoracic and upper lumbar region. Disc levels: Small posterior disc protrusions and mild facet degenerative changes at T7-8, T8-9, T9-10 and T10-11 without significant spinal canal or neural foraminal stenosis. No significant disc  bulge or herniation, spinal canal or neural foraminal stenosis and remaining thoracic levels. MRI LUMBAR SPINE FINDINGS Segmentation:  Standard. Alignment: Minimal anterolisthesis of L4 over L5 related to facet degeneration. Vertebrae: No fracture, evidence of discitis, or bone lesion. Hemangioma in the L1 vertebral body. Conus medullaris and cauda equina: Conus extends to the L1 level. Conus and cauda equina appear normal. Paraspinal and other soft tissues: Edema in the paraspinal musculature on the left side in the lower thoracic and upper lumbar region. 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 Disc levels: T12-L1: No spinal canal or neural foraminal stenosis. L1-2: Facet degenerative changes without significant spinal canal or neural foraminal stenosis. L2-3: Facet degenerative changes without significant spinal canal or neural foraminal stenosis. L3-4: Shallow disc bulge, facet degenerative changes and ligamentum flavum redundancy without significant spinal canal or neural foraminal stenosis. L4-5: Shallow disc bulge, prominent facet degenerative changes, left greater than right, and ligamentum flavum redundancy resulting in mild spinal canal stenosis. No significant neural foraminal narrowing. L5-S1: Prominent facet degenerative changes. No significant spinal canal or neural foraminal stenosis IMPRESSION: 1. No acute abnormality of the thoracic or lumbar spine. 2. Edema in the paraspinal musculature on the left side in the lower thoracic and upper lumbar region. This likely reflects a muscle strain. 3. Mild degenerative changes without significant spinal canal or neural foraminal stenosis at any level. Electronically Signed   By: Pedro Earls M.D.   On: 04/20/2020 08:57   ECHOCARDIOGRAM COMPLETE  Result Date: 04/18/2020    ECHOCARDIOGRAM REPORT   Patient Name:   Diane Snow Date of Exam: 04/18/2020 Medical Rec #:  [id number]        Height:        58.0 in Accession #:    [id number]       Weight:       130.0 lb Date of Birth:  1945-11-21         BSA:          1.516 m Patient Age:    29 years         BP:           125/91 mmHg Patient Gender: F                HR:           78 bpm. Exam Location:  Inpatient Procedure: 2D Echo, Cardiac Doppler and Color Doppler Indications:    Leg Swelling 244481; Elevated Brain Natriuretic Peptide (BNP)                 Level 539767  History:        Patient has no prior history of Echocardiogram examinations.                 Risk Factors:Hypertension.  Sonographer:    Jonelle Sidle Dance Referring Phys: [phone]  TIMOTHY S OPYD IMPRESSIONS  1. Hyperdynamic LV systolic function; moderate LVH; intracavitary gradient of 2.3 m/s likely due to hyperdynamic function; moderate LVH; grade 1 diastolic dysfunction.  2. Left ventricular ejection fraction, by estimation, is >75%. The left ventricle has hyperdynamic function. The left ventricle has no regional wall motion abnormalities. There is moderate left ventricular hypertrophy. Left ventricular diastolic parameters are consistent with Grade I diastolic dysfunction (impaired relaxation).  3. Right ventricular systolic function is normal. The right ventricular size is normal.  4. The mitral valve is normal in structure. No evidence of mitral valve regurgitation. No evidence of mitral stenosis.  5. The aortic valve is tricuspid. Aortic valve regurgitation is not visualized. Mild aortic valve sclerosis is present, with no evidence of aortic valve stenosis.  6. The inferior vena cava is normal in size with greater than 50% respiratory variability, suggesting right atrial pressure of 3 mmHg. FINDINGS  Left Ventricle: Left ventricular ejection fraction, by estimation, is >75%. The left ventricle has hyperdynamic function. The left ventricle has no regional wall motion abnormalities. The left ventricular internal cavity size was normal in size. There is moderate left ventricular hypertrophy. Left  ventricular diastolic parameters are consistent with Grade I diastolic dysfunction (impaired relaxation). Right Ventricle: The right ventricular size is normal. Right ventricular systolic function is normal. Left Atrium: Left atrial size was normal in size. Right Atrium: Right atrial size was normal in size. Pericardium: There is no evidence of pericardial effusion. Mitral Valve: The mitral valve is normal in structure. Normal mobility of the mitral valve leaflets. No evidence of mitral valve regurgitation. No evidence of mitral valve stenosis. Tricuspid Valve: The tricuspid valve is normal in structure. Tricuspid valve regurgitation is trivial. No evidence of tricuspid stenosis. Aortic Valve: The aortic valve is tricuspid. Aortic valve regurgitation is not visualized. Mild aortic valve sclerosis is present, with no evidence of aortic valve stenosis. Pulmonic Valve: The pulmonic valve was normal in structure. Pulmonic valve regurgitation is not visualized. No evidence of pulmonic stenosis. Aorta: The aortic root is normal in size and structure. Venous: The inferior vena cava is normal in size with greater than 50% respiratory variability, suggesting right atrial pressure of 3 mmHg. IAS/Shunts: No atrial level shunt detected by color flow Doppler. Additional Comments: Hyperdynamic LV systolic function; moderate LVH; intracavitary gradient of 2.3 m/s likely due to hyperdynamic function; moderate LVH; grade 1 diastolic dysfunction.  LEFT VENTRICLE PLAX 2D LVIDd:         3.30 cm  Diastology LVIDs:         1.80 cm  LV e' lateral:   5.66 cm/s LV PW:         1.00 cm  LV E/e' lateral: 9.7 LV IVS:        1.00 cm  LV e' medial:    4.35 cm/s LVOT diam:     1.70 cm  LV E/e' medial:  12.6 LV SV:         42 LV SV Index:   27 LVOT Area:     2.27 cm  RIGHT VENTRICLE             IVC RV Basal diam:  2.50 cm     IVC diam: 1.40 cm RV S prime:     10.70 cm/s TAPSE (M-mode): 1.9 cm LEFT ATRIUM             Index       RIGHT ATRIUM  Index LA diam:        2.50 cm 1.65 cm/m  RA Area:     11.10 cm LA Vol (A2C):   27.1 ml 17.87 ml/m RA Volume:   22.50 ml  14.84 ml/m LA Vol (A4C):   36.4 ml 24.00 ml/m LA Biplane Vol: 33.5 ml 22.09 ml/m  AORTIC VALVE LVOT Vmax:   114.00 cm/s LVOT Vmean:  67.800 cm/s LVOT VTI:    0.183 m  AORTA Ao Root diam: 2.70 cm Ao Asc diam:  3.40 cm MITRAL VALVE MV Area (PHT): 2.24 cm    SHUNTS MV Decel Time: 338 msec    Systemic VTI:  0.18 m MV E velocity: 54.80 cm/s  Systemic Diam: 1.70 cm MV A velocity: 84.60 cm/s MV E/A ratio:  0.65 Kirk Ruths MD Electronically signed by Kirk Ruths MD Signature Date/Time: 04/18/2020/2:17:09 PM    Final    [place] EKG SITE RITE  Result Date: 04/18/2020 If Site Rite image not attached, placement could not be confirmed due to current cardiac rhythm.       Scheduled Meds: . aspirin EC  81 mg Oral Daily  . Chlorhexidine Gluconate Cloth  6 each Topical Daily  . enoxaparin (LOVENOX) injection  30 mg Subcutaneous Q24H  . feeding supplement (ENSURE ENLIVE)  237 mL Oral BID BM  . multivitamin  1 tablet Oral Daily  . sodium chloride flush  10-40 mL Intracatheter Q12H  . thiamine  100 mg Oral Daily  . vitamin B-12  1,000 mcg Oral Daily   Continuous Infusions: . sodium chloride    . lactated ringers 100 mL/hr at 04/20/20 0107     LOS: 2 days     Cordelia Poche, MD [facility] 04/20/2020, 12:03 PM  If 7PM-7AM, please contact night-coverage [email or web address]

## 2020-04-21 LAB — CBC
HCT: 32.4 % — ABNORMAL LOW (ref 36.0–46.0)
Hemoglobin: 10.6 g/dL — ABNORMAL LOW (ref 12.0–15.0)
MCH: 29.6 pg (ref 26.0–34.0)
MCHC: 32.7 g/dL (ref 30.0–36.0)
MCV: 90.5 fL (ref 80.0–100.0)
Platelets: 129 10*3/uL — ABNORMAL LOW (ref 150–400)
RBC: 3.58 MIL/uL — ABNORMAL LOW (ref 3.87–5.11)
RDW: 13.6 % (ref 11.5–15.5)
WBC: 6.3 10*3/uL (ref 4.0–10.5)
nRBC: 0 % (ref 0.0–0.2)

## 2020-04-21 LAB — BASIC METABOLIC PANEL
Anion gap: 11 (ref 5–15)
BUN: 23 mg/dL (ref 8–23)
CO2: 23 mmol/L (ref 22–32)
Calcium: 7.7 mg/dL — ABNORMAL LOW (ref 8.9–10.3)
Chloride: 105 mmol/L (ref 98–111)
Creatinine, Ser: 0.68 mg/dL (ref 0.44–1.00)
GFR calc Af Amer: >60 mL/min (ref >60)
GFR calc non Af Amer: >60 mL/min (ref >60)
Glucose, Bld: 92 mg/dL (ref 70–99)
Potassium: 3.3 mmol/L — ABNORMAL LOW (ref 3.5–5.1)
Sodium: 139 mmol/L (ref 135–145)

## 2020-04-21 LAB — SEDIMENTATION RATE: Sed Rate: 25 mm/hr — ABNORMAL HIGH (ref 0–22)

## 2020-04-21 LAB — CK: Total CK: 2159 U/L — ABNORMAL HIGH (ref 38–234)

## 2020-04-21 LAB — C-REACTIVE PROTEIN: CRP: 4.2 mg/dL — ABNORMAL HIGH (ref <1.0)

## 2020-04-21 MED ORDER — CALCIUM CARBONATE ANTACID 500 MG PO CHEW
1.0000 | CHEWABLE_TABLET | Freq: Three times a day (TID) | ORAL | Status: AC
  Administered 2020-04-21 – 2020-04-22 (×2): 200 mg via ORAL
  Filled 2020-04-21 (×3): qty 1

## 2020-04-21 NOTE — Progress Notes (Signed)
Interim Note   Reviewed MRI brain-no acute findings.  Motion degraded exam as she does have chronic white matter disease, so it is possible for patient to have remote right lacunar infarct. MRI C-spine does not show any evidence of demyelination or inflammation/cord compression.  CK slightly up at 2,000.  CRP trending down to around  4 from 10, AKI also improving  Impression Patient has complex neurological exam findings - left facial droop, left arm spasticity, bilateral lower extremity proximal muscle weakness significantly more than distal weakness, normal sensation in bilateral feet-reduced sensation to light touch over right thigh.  Differential diagnosis includes:  1. 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  2.  Mononeuritis multiplex, AIDP, paraneoplastic etc.    Plan:  -We will reassess clinical exam tomorrow to see if bilateral upper extremity strength is improving rhabdomyolysis resolves and reflexes remain intact. -Consideration include possible muscle biopsy,LP, autoimmune work-up, empiric steroids etc depending patient's examination tomorrow

## 2020-04-21 NOTE — Consult Note (Signed)
WOC Nurse Consult Note: Reason for Consult: pressure injuries Patient sustained fall at home; lying against chair for greater than 8 hours Wound type: 1. Stage 2 pressure injury; left buttock 2. Deep tissue pressure injury; mid vertebral column   Pressure Injury POA: Yes Measurement: 1. 2.5cm x 2.0cm x 0.1cm  2. 0.4cm x 3.5cm x 0.1cm  Wound bed: 1. Buttock: 100% pink, clean, partial thickness skin loss 2. Dark purple non blanchable skin in linear pattern consistent with pressure from edge of chair; 3 areas- of partial thickness skin loss along the linear lesion Drainage (amount, consistency, odor) scant at each site, no odor Periwound: intact  Dressing procedure/placement/frequency: Continue silicone foam to protect, insulate, provide moist wound healing. Change every 3 days. Turn and reposition every 2 hours.   Discussed POC with patient and bedside nurse.  Re consult if needed, will not follow at this time. Thanks  Kiannah Grunow [employer], RN,CWOCN, CNS, [place] [phone])

## 2020-04-21 NOTE — Progress Notes (Signed)
PROGRESS NOTE    Diane Snow  IOM:[id number] DOB: [birth year] DOA: 04/17/2020 PCP: Mack Hook, MD   Brief Narrative: Diane Snow is a 74 y.o. female with a history of hypertension, LE edema. She presented after being found down on a chair. She was found to have evidence of rhabdomyolysis and resultant AKI. She currently has persistent LE weakness.   Assessment & Plan:   Principal Problem:   Rhabdomyolysis Active Problems:   Essential hypertension   Generalized weakness   AKI (acute kidney injury) ([place])   Leg swelling   Ambulatory dysfunction   Pressure injury of skin   Rhabdomyolysis (non-traumatic) Patient found in awkward positioning at home. Patient was in this position for several hours, hanging of a chair sideways with her back on the seat. CK on admission of 18k and has not significantly improved with IV fluids. Associated AKI which has also improved. -IV fluids; LR fluids  AKI Baseline creatinine of 0.89. Creatinine of 1.33 on admission with a peak of 2.17. Improved and back to baseline.  Lower extremity weakness Possibly in relation to rhabdomyolysis. However, there is concern for possible neurologic etiology. Thoracic and lumbar spine unrevealing for etiology. Neurology consulted and recommending MRI cervical spine and brain significant for no acute stroke; some cervical stenosis. -Neurology recommendations: CK, ESR, CRP. Possible EMG as an outpatient. -PT recommendations  Possibly Parkinson disease Patient with outpatient neurology follow-up in July.  Essential hypertension Mostly controlled.  Lower extremity swelling Initial concern for possible cardiac etiology. Transthoracic Echocardiogram significant for LVH and grade 1 diastolic dysfunction. According to patient, swelling is stable for many years. -Elevate legs as much as able  Pyuria No evidence of UTI. No symptoms.  Malnutrition Unknown severity.  -Continue Ensure  Pressure  injury Left buttock, stage 2, POA    DVT prophylaxis: Lovenox Code Status:   Code Status: Full Code Family Communication: Cousin on telephone Disposition Plan: Discharge to SNF (CIR declined admission) in several days pending ability to ambulate and neurology recommendations   Consultants:   Neurology  Procedures:   TRANSTHORACIC ECHOCARDIOGRAM (04/18/2020) IMPRESSIONS    1. Hyperdynamic LV systolic function; moderate LVH; intracavitary  gradient of 2.3 m/s likely due to hyperdynamic function; moderate LVH;  grade 1 diastolic dysfunction.  2. Left ventricular ejection fraction, by estimation, is >75%. The left  ventricle has hyperdynamic function. The left ventricle has no regional  wall motion abnormalities. There is moderate left ventricular hypertrophy.  Left ventricular diastolic  parameters are consistent with Grade I diastolic dysfunction (impaired  relaxation).  3. Right ventricular systolic function is normal. The right ventricular  size is normal.  4. The mitral valve is normal in structure. No evidence of mitral valve  regurgitation. No evidence of mitral stenosis.  5. The aortic valve is tricuspid. Aortic valve regurgitation is not  visualized. Mild aortic valve sclerosis is present, with no evidence of  aortic valve stenosis.  6. The inferior vena cava is normal in size with greater than 50%  respiratory variability, suggesting right atrial pressure of 3 mmHg.   Antimicrobials:  Ceftriaxone   Subjective: Numbness improved. Still extremely weak.  Objective: Vitals:   04/20/20 0430 04/20/20 0500 04/20/20 2022 04/21/20 0603  BP: (!) 144/66  130/67 (!) 153/77  Pulse: 65  79 63  Resp: _0 Temp: 97.8 F (36.6 C)  98.4 F (36.9 C) 98.3 F (36.8 C)  TempSrc: Oral  Oral Oral  SpO2: 98%  98% 98%  Weight:  64.8 kg    Height:        Intake/Output Summary (Last 24 hours) at 04/21/2020 1142 Last data filed at 04/21/2020 0900 Gross per 24 hour   Intake 2489.63 ml  Output 1400 ml  Net 1089.63 ml   Filed Weights   04/17/20 2230 04/20/20 0500  Weight: 59 kg 64.8 kg    Examination:  General exam: Appears calm and comfortable Respiratory system: Clear to auscultation. Respiratory effort normal Cardiovascular system: S1 & S2 heard, RRR. 1/6 systolic murmur Gastrointestinal system: Abdomen is nondistended, soft and nontender. No organomegaly or masses felt. Normal bowel sounds heard. Central nervous system: Alert and oriented. BLE weakness with 2/5 strength bilaterally. Equal sensation of BLE. Musculoskeletal: 1+ LE edema. No calf tenderness Skin: No cyanosis. No rashes Psychiatry: Judgement and insight appear normal. Blunt affect    Data Reviewed: I have personally reviewed following labs and imaging studies  CBC Lab Results  Component Value Date   WBC 6.3 04/21/2020   RBC 3.58 (L) 04/21/2020   HGB 10.6 (L) 04/21/2020   HCT 32.4 (L) 04/21/2020   MCV 90.5 04/21/2020   MCH 29.6 04/21/2020   PLT 129 (L) 04/21/2020   MCHC 32.7 04/21/2020   RDW 13.6 04/21/2020   LYMPHSABS 1.5 03/22/2020   EOSABS 0.0 03/22/2020   BASOSABS 0.0 66/44/0347     Last metabolic panel Lab Results  Component Value Date   NA 139 04/21/2020   K 3.3 (L) 04/21/2020   CL 105 04/21/2020   CO2 23 04/21/2020   BUN 23 04/21/2020   CREATININE 0.68 04/21/2020   GLUCOSE 92 04/21/2020   GFRNONAA >60 04/21/2020   GFRAA >60 04/21/2020   CALCIUM 7.7 (L) 04/21/2020   PROT 8.6 (H) 04/17/2020   ALBUMIN 3.9 04/17/2020   LABGLOB 3.3 04/12/2020   AGRATIO 1.2 04/12/2020   BILITOT 0.9 04/17/2020   ALKPHOS 70 04/17/2020   AST 156 (H) 04/17/2020   ALT 35 04/17/2020   ANIONGAP 11 04/21/2020    CBG (last 3)  No results for input(s): GLUCAP in the last 72 hours.   GFR: Estimated Creatinine Clearance: 49.2 mL/min (by C-G formula based on SCr of 0.68 mg/dL).  Coagulation Profile: No results for input(s): INR, PROTIME in the last 168 hours.  Recent  Results (from the past 240 hour(s))  Urine culture     Status: None   Collection Time: 04/18/20 12:07 AM   Specimen: Urine, Random  Result Value Ref Range Status   Specimen Description   Final    URINE, RANDOM Performed at [facility] [address] [place], [place] [address]    Special Requests   Final    NONE Performed at [facility], [place] [address], [place], [place] [address]    Culture   Final    NO GROWTH Performed at [facility], [place] [address], [place], [place] [address]    Report Status 04/19/2020 FINAL  Final  SARS Coronavirus 2 by RT PCR (hospital order, performed in [facility] hospital lab) Nasopharyngeal Nasopharyngeal Swab     Status: None   Collection Time: 04/18/20  2:05 AM   Specimen: Nasopharyngeal Swab  Result Value Ref Range Status   SARS Coronavirus 2 NEGATIVE NEGATIVE Final    Comment: (NOTE) SARS-CoV-2 target nucleic acids are NOT DETECTED.  The SARS-CoV-2 RNA is generally detectable in upper and lower respiratory specimens during the acute phase of infection. The lowest concentration of SARS-CoV-2 viral copies this assay can  detect is 250 copies / mL. A negative result does not preclude SARS-CoV-2 infection and should not be used as the sole basis for treatment or other patient management decisions.  A negative result may occur with improper specimen collection / handling, submission of specimen other than nasopharyngeal swab, presence of viral mutation(s) within the areas targeted by this assay, and inadequate number of viral copies (<250 copies / mL). A negative result must be combined with clinical observations, patient history, and epidemiological information.  Fact Sheet for Patients:   StrictlyIdeas.no  Fact Sheet for Healthcare Providers: BankingDealers.co.za  This test is not yet approved or  cleared by the [place] FDA and has been  authorized for detection and/or diagnosis of SARS-CoV-2 by FDA under an Emergency Use Authorization (EUA).  This EUA will remain in effect (meaning this test can be used) for the duration of the COVID-19 declaration under Section 564(b)(1) of the Act, 21 U.S.C. section 360bbb-3(b)(1), unless the authorization is terminated or revoked sooner.  Performed at [facility], [place] [address], [place], [place] [address]         Radiology Studies: MR BRAIN WO CONTRAST  Result Date: 04/20/2020 CLINICAL DATA:  Stroke, follow-up. Progressive bilateral lower extremity weakness. EXAM: MRI HEAD WITHOUT CONTRAST TECHNIQUE: Multiplanar, multiecho pulse sequences of the brain and surrounding structures were obtained without intravenous contrast. COMPARISON:  Head CT 04/17/2020 FINDINGS: Multiple sequences are moderately motion degraded. Brain: There is no evidence of acute infarct, intracranial hemorrhage, mass, midline shift, or extra-axial fluid collection. Patchy T2 hyperintensities in the cerebral white matter bilaterally are nonspecific but compatible with mild-to-moderate chronic small vessel ischemic disease. Mild cerebral atrophy is within normal limits for age. Vascular: Major intracranial vascular flow voids are preserved. Skull and upper cervical spine: No suspicious marrow lesion. Sinuses/Orbits: Unremarkable orbits. Trace mucosal thickening in the maxillary sinuses. Clear mastoid air cells. Other: None. IMPRESSION: 1. No acute intracranial abnormality. 2. Mild-to-moderate chronic small vessel ischemic disease. Electronically Signed   By: Logan Bores M.D.   On: 04/20/2020 16:59   MR CERVICAL SPINE WO CONTRAST  Result Date: 04/20/2020 CLINICAL DATA:  Ataxia, cervical spine trauma. Bilateral lower extremity weakness. EXAM: MRI CERVICAL SPINE WITHOUT CONTRAST TECHNIQUE: Multiplanar, multisequence MR imaging of the cervical spine was performed. No intravenous contrast was administered.  COMPARISON:  Cervical spine CT 04/17/2020 FINDINGS: The study is moderately motion degraded. Alignment: No significant listhesis is evident within limitations of motion artifact. Vertebrae: No fracture, suspicious osseous lesion, or significant marrow edema. Cord: Limited assessment due to motion. No gross cord signal abnormality. Posterior Fossa, vertebral arteries, paraspinal tissues: Unremarkable. Disc levels: C2-3: Negative. C3-4: Disc bulging and moderate left facet arthrosis result in mild spinal stenosis and borderline to mild left neural foraminal stenosis. C4-5: Broad-based posterior disc osteophyte complex and mild facet arthrosis result in moderate spinal stenosis with mild cord flattening and mild-to-moderate bilateral neural foraminal stenosis. C5-6: Broad-based posterior disc osteophyte complex, infolding of the ligamentum flavum, and mild facet arthrosis result in moderate spinal stenosis and mild right neural foraminal stenosis. C6-7: Mild disc bulging results in mild spinal stenosis without significant neural foraminal stenosis. C7-T1: Partially calcified central to right paracentral disc protrusion without evidence of significant stenosis. IMPRESSION: 1. Motion degraded examination. 2. Multilevel cervical disc degeneration with moderate spinal stenosis at C4-5 and C5-6. 3. Mild spinal stenosis at C3-4 and C6-7. 4. No gross spinal cord signal abnormality. Electronically Signed   By: Logan Bores M.D.   On: 04/20/2020 16:53  MR THORACIC SPINE WO CONTRAST  Result Date: 04/20/2020 CLINICAL DATA:  Myelopathy, acute, progressive. EXAM: MRI THORACIC AND LUMBAR SPINE WITHOUT CONTRAST TECHNIQUE: Multiplanar and multiecho pulse sequences of the thoracic and lumbar spine were obtained without intravenous contrast. COMPARISON:  None. FINDINGS: Study partially degraded by motion. MRI THORACIC SPINE FINDINGS Alignment:  Physiologic. Vertebrae: No fracture, evidence of discitis, or bone lesion. Endplate  degenerative changes are noted at T9-10. Cord:  Normal signal and morphology. Paraspinal and other soft tissues: Edema in the paraspinal musculature on the left side in the lower thoracic and upper lumbar region. Disc levels: Small posterior disc protrusions and mild facet degenerative changes at T7-8, T8-9, T9-10 and T10-11 without significant spinal canal or neural foraminal stenosis. No significant disc bulge or herniation, spinal canal or neural foraminal stenosis and remaining thoracic levels. MRI LUMBAR SPINE FINDINGS Segmentation:  Standard. Alignment: Minimal anterolisthesis of L4 over L5 related to facet degeneration. Vertebrae: No fracture, evidence of discitis, or bone lesion. Hemangioma in the L1 vertebral body. Conus medullaris and cauda equina: Conus extends to the L1 level. Conus and cauda equina appear normal. Paraspinal and other soft tissues: Edema in the paraspinal musculature on the left side in the lower thoracic and upper lumbar region. 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 Disc levels: T12-L1: No spinal canal or neural foraminal stenosis. L1-2: Facet degenerative changes without significant spinal canal or neural foraminal stenosis. L2-3: Facet degenerative changes without significant spinal canal or neural foraminal stenosis. L3-4: Shallow disc bulge, facet degenerative changes and ligamentum flavum redundancy without significant spinal canal or neural foraminal stenosis. L4-5: Shallow disc bulge, prominent facet degenerative changes, left greater than right, and ligamentum flavum redundancy resulting in mild spinal canal stenosis. No significant neural foraminal narrowing. L5-S1: Prominent facet degenerative changes. No significant spinal canal or neural foraminal stenosis IMPRESSION: 1. No acute abnormality of the thoracic or lumbar spine. 2. Edema in the paraspinal musculature on the left side in the lower thoracic and upper lumbar region. This  likely reflects a muscle strain. 3. Mild degenerative changes without significant spinal canal or neural foraminal stenosis at any level. Electronically Signed   By: Pedro Earls M.D.   On: 04/20/2020 08:57   MR LUMBAR SPINE WO CONTRAST  Result Date: 04/20/2020 CLINICAL DATA:  Myelopathy, acute, progressive. EXAM: MRI THORACIC AND LUMBAR SPINE WITHOUT CONTRAST TECHNIQUE: Multiplanar and multiecho pulse sequences of the thoracic and lumbar spine were obtained without intravenous contrast. COMPARISON:  None. FINDINGS: Study partially degraded by motion. MRI THORACIC SPINE FINDINGS Alignment:  Physiologic. Vertebrae: No fracture, evidence of discitis, or bone lesion. Endplate degenerative changes are noted at T9-10. Cord:  Normal signal and morphology. Paraspinal and other soft tissues: Edema in the paraspinal musculature on the left side in the lower thoracic and upper lumbar region. Disc levels: Small posterior disc protrusions and mild facet degenerative changes at T7-8, T8-9, T9-10 and T10-11 without significant spinal canal or neural foraminal stenosis. No significant disc bulge or herniation, spinal canal or neural foraminal stenosis and remaining thoracic levels. MRI LUMBAR SPINE FINDINGS Segmentation:  Standard. Alignment: Minimal anterolisthesis of L4 over L5 related to facet degeneration. Vertebrae: No fracture, evidence of discitis, or bone lesion. Hemangioma in the L1 vertebral body. Conus medullaris and cauda equina: Conus extends to the L1 level. Conus and cauda equina appear normal. Paraspinal and other soft tissues: Edema in the paraspinal musculature on the left side in the lower thoracic  and upper lumbar region. 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 Disc levels: T12-L1: No spinal canal or neural foraminal stenosis. L1-2: Facet degenerative changes without significant spinal canal or neural foraminal stenosis. L2-3: Facet degenerative  changes without significant spinal canal or neural foraminal stenosis. L3-4: Shallow disc bulge, facet degenerative changes and ligamentum flavum redundancy without significant spinal canal or neural foraminal stenosis. L4-5: Shallow disc bulge, prominent facet degenerative changes, left greater than right, and ligamentum flavum redundancy resulting in mild spinal canal stenosis. No significant neural foraminal narrowing. L5-S1: Prominent facet degenerative changes. No significant spinal canal or neural foraminal stenosis IMPRESSION: 1. No acute abnormality of the thoracic or lumbar spine. 2. Edema in the paraspinal musculature on the left side in the lower thoracic and upper lumbar region. This likely reflects a muscle strain. 3. Mild degenerative changes without significant spinal canal or neural foraminal stenosis at any level. Electronically Signed   By: Pedro Earls M.D.   On: 04/20/2020 08:57   DG Foot 2 Views Left  Result Date: 04/20/2020 CLINICAL DATA:  Toe pain pain at third digit EXAM: LEFT FOOT - 2 VIEW COMPARISON:  09/10/2018 FINDINGS: No acute displaced fracture or malalignment. Chronic distal fifth toe deformity with prior partial resection of the metatarsal. No osseous erosive change or bony destruction. IMPRESSION: No acute osseous abnormality. Chronic appearing deformity involving the fifth digit distally. Electronically Signed   By: Donavan Foil M.D.   On: 04/20/2020 15:30        Scheduled Meds: . aspirin EC  81 mg Oral Daily  . calcium carbonate  1 tablet Oral TID WC  . Chlorhexidine Gluconate Cloth  6 each Topical Daily  . enoxaparin (LOVENOX) injection  40 mg Subcutaneous Q24H  . feeding supplement (ENSURE ENLIVE)  237 mL Oral BID BM  . multivitamin  1 tablet Oral Daily  . sodium chloride flush  10-40 mL Intracatheter Q12H  . thiamine  100 mg Oral Daily  . vitamin B-12  1,000 mcg Oral Daily   Continuous Infusions: . sodium chloride    . lactated ringers  100 mL/hr at 04/21/20 3646     LOS: 3 days     Cordelia Poche, MD [facility] 04/21/2020, 11:42 AM  If 7PM-7AM, please contact night-coverage [email or web address]

## 2020-04-21 NOTE — Progress Notes (Signed)
Physical Therapy Treatment Patient Details Name: Natacia Chaisson MRN: [id number] DOB: [birth year] Today's Date: 04/21/2020    History of Present Illness 74 year old female with significant past medical history for hypertension and bilateral lower extremity weakness who presented due to progressive weakness.  Her ambulatory dysfunction and lower extremity weakness had a rapid progression to the point where she had difficulty using her walker. She was referred to neurology as outpatient for the evaluation of possible parkinson's disease.    PT Comments    Daughter present during session.  Assisted OOB to [facility] then to recliner. General bed mobility comments: profoundly weak and required + 2 Total Assist to roll and perform supine to sit.  Very rigid.  Severe posterior RIGHT lean.  Unable to sit EOB without assist.  Rigidity throughout and includes cervical mvmt, B UE's and B LE's. General transfer comment: First assisted from elevated bed to [facility] via "Bear Hug" as pt was unable to use B UE's enough to grasp walker. Pt required Total Assist to stand and complete 1/4 turn.  Once on lower level BSC, Therapist was unable to assist pt off.  NT called to room to assist using stedy to move pt to recliner.  Pt <5% effort.  Profound weakness.  Rec nursing use Maxi Move to assist back to bed.  Pad placed in recliner under pt.General Gait Details: unable to attempt due to poor transfer ability. Pt will need ST Rehab at SNF prior to safely returning home.   Follow Up Recommendations  SNF     Equipment Recommendations       Recommendations for Other Services       Precautions / Restrictions Precautions Precautions: Fall Restrictions Weight Bearing Restrictions: No    Mobility  Bed Mobility Overal bed mobility: Needs Assistance   Rolling: Total assist;+2 for physical assistance;+2 for safety/equipment         General bed mobility comments: profoundly weak and required + 2 Total Assist to roll and  perform supine to sit.  Very rigid.  Severe posterior RIGHT lean.  Unable to sit EOB without assist.  Rigidity throughout and includes cervical mvmt, B UE's and B LE's.  Transfers                 General transfer comment: First assisted from elevated bed to [facility] via "Bear Hug" as pt was unable to use B UE's enough to grasp walker. Pt required Total Assist to stand and complete 1/4 turn.  Once on lower level BSC, Therapist was unable to assist pt off.  NT called to room to assist using stedy to move pt to recliner.  Pt <5% effort.  Profound weakness.  Rec nursing use Maxi Move to assist back to bed.  Pad placed in recliner under pt.  Ambulation/Gait             General Gait Details: unable to attempt due to poor transfer ability   Stairs             Wheelchair Mobility    Modified Rankin (Stroke Patients Only)       Balance                                            Cognition Arousal/Alertness: Awake/alert Behavior During Therapy: WFL for tasks assessed/performed Overall Cognitive Status: Within Functional Limits for tasks assessed  General Comments: AxO x 3 slightly delayed/slow      Exercises      General Comments        Pertinent Vitals/Pain      Home Living                      Prior Function            PT Goals (current goals can now be found in the care plan section) Progress towards PT goals: Progressing toward goals    Frequency    Min 2X/week      PT Plan Current plan remains appropriate    Co-evaluation              AM-PAC PT "6 Clicks" Mobility   Outcome Measure  Help needed turning from your back to your side while in a flat bed without using bedrails?: Total Help needed moving from lying on your back to sitting on the side of a flat bed without using bedrails?: Total Help needed moving to and from a bed to a chair (including a wheelchair)?:  Total Help needed standing up from a chair using your arms (e.g., wheelchair or bedside chair)?: Total Help needed to walk in hospital room?: Total Help needed climbing 3-5 steps with a railing? : Total 6 Click Score: 6    End of Session Equipment Utilized During Treatment: Gait belt Activity Tolerance: Treatment limited secondary to medical complications (Comment) Patient left: in chair;with call bell/phone within reach;with family/visitor present Nurse Communication: Mobility status PT Visit Diagnosis: Other abnormalities of gait and mobility (R26.89);Other symptoms and signs involving the nervous system (R29.898)     Time: 1350-1420 PT Time Calculation (min) (ACUTE ONLY): 30 min  Charges:  $Therapeutic Activity: 23-37 mins                     Rica Koyanagi  PTA Acute  Rehabilitation Services Pager      [phone] Office      [phone]

## 2020-04-21 NOTE — TOC Transition Note (Signed)
Transition of Care [facility]) - CM/SW Discharge Note   Patient Details  Name: Diane Snow MRN: [id number] Date of Birth: 1946/10/26  [facility] CM/SW Contact:  Lynnell Catalan, RN Phone Number: 04/21/2020, 3:53 PM   Clinical Narrative:    Pt was declined by CIR. This CM spoke with both pt and cousin at bedside to inform of next steps. New PT eval from today is recommending SNF. Pt and cousin agree to SNF placement. FL2 faxed out and pt is not managed by [facility] so insurance auth will have to be done by chosen snf facility. Pt first choice for snf is [facility]. Bed offers will be given to pt when available. TOC will continue to follow.     Barriers to Discharge: Continued Medical Work up   Patient Goals and CMS Choice Patient states their goals for this hospitalization and ongoing recovery are:: Pt would hope to be able to return home, however, realistic that rehab is needed at this time. CMS Medicare.gov Compare Post Acute Care list provided to:: Patient Choice offered to / list presented to : Patient  Discharge Placement                       Discharge Plan and Services In-house Referral: Clinical Social Work   Post Acute Care Choice: [place], IP Rehab                               Social Determinants of Health (SDOH) Interventions     Readmission Risk Interventions No flowsheet data found.

## 2020-04-21 NOTE — NC FL2 (Signed)
[place] LEVEL OF CARE SCREENING TOOL     IDENTIFICATION  Patient Name: Diane Snow Birthdate: [birth year] Sex: female Admission Date (Current Location): 04/17/2020  [facility] and [place] Number:  Herbalist and Address:  [facility],  [place], [place]      Provider Number: [id number]  Attending Physician Name and Address:  Mariel Aloe, MD  Relative Name and Phone Number:       Current Level of Care: Hospital Recommended Level of Care: [place] Prior Approval Number:    Date Approved/Denied:   PASRR Number:    Discharge Plan: SNF    Current Diagnoses: Patient Active Problem List   Diagnosis Date Noted   Pressure injury of skin 04/20/2020   Generalized weakness 04/18/2020   AKI (acute kidney injury) ([place]) 04/18/2020   Leg swelling 04/18/2020   Rhabdomyolysis 04/18/2020   Ambulatory dysfunction 04/18/2020   Left-sided weakness 03/16/2020   Weight loss 03/16/2020   Gait instability 03/16/2020   Dysthymia 03/16/2020   Chronic pain of right knee 06/15/2019   Unilateral primary osteoarthritis, right knee 06/15/2019   Chronic pain of left knee 05/12/2018   Unilateral primary osteoarthritis, left knee 05/12/2018   Essential hypertension 11/16/2015   Hx of adenomatous colonic polyps 09/27/2014    Orientation RESPIRATION BLADDER Height & Weight     Self, Time, Situation, Place  Normal Continent Weight: 64.8 kg Height:  4\' 10"  (147.3 cm)  BEHAVIORAL SYMPTOMS/MOOD NEUROLOGICAL BOWEL NUTRITION STATUS      Continent Diet (Heart healthy)  AMBULATORY STATUS COMMUNICATION OF NEEDS Skin   Extensive Assist Verbally Other (Comment) (Blister to buttocks)                       Personal Care Assistance Level of Assistance  Bathing, Dressing Bathing Assistance: Maximum assistance   Dressing Assistance: Maximum assistance     Functional Limitations Info              [place]  PT (By licensed PT), OT (By licensed OT)     PT Frequency: 5 x weekly OT Frequency: 5 x weekly            Contractures Contractures Info: Not present    Additional Factors Info  Code Status, Allergies Code Status Info: Full Allergies Info: Aspirin           Current Medications (04/21/2020):  This is the current hospital active medication list Current Facility-Administered Medications  Medication Dose Route Frequency Provider Last Rate Last Admin   0.9 %  sodium chloride infusion  250 mL Intravenous PRN Opyd, Ilene Qua, MD       acetaminophen (TYLENOL) tablet 650 mg  650 mg Oral Q6H PRN Opyd, Ilene Qua, MD   650 mg at 04/21/20 1240   Or   acetaminophen (TYLENOL) suppository 650 mg  650 mg Rectal Q6H PRN Opyd, Ilene Qua, MD       aspirin EC tablet 81 mg  81 mg Oral Daily Opyd, Ilene Qua, MD   81 mg at 04/21/20 0957   calcium carbonate (TUMS - dosed in mg elemental calcium) chewable tablet 200 mg of elemental calcium  1 tablet Oral TID WC Mariel Aloe, MD   200 mg of elemental calcium at 04/21/20 1239   Chlorhexidine Gluconate Cloth 2 % PADS 6 each  6 each Topical Daily Tawni Millers, MD   6 each at 04/21/20 [phone]  enoxaparin (LOVENOX) injection 40 mg  40 mg Subcutaneous Q24H Minda Ditto, RPH   40 mg at 04/21/20 0957   feeding supplement (ENSURE ENLIVE) (ENSURE ENLIVE) liquid 237 mL  237 mL Oral BID BM Arrien, Jimmy Picket, MD   237 mL at 04/21/20 3005   lactated ringers infusion   Intravenous Continuous Arrien, Jimmy Picket, MD 100 mL/hr at 04/21/20 [phone] Rate Verify at 04/21/20 1117   multivitamin (PROSIGHT) tablet 1 tablet  1 tablet Oral Daily Arrien, Jimmy Picket, MD   1 tablet at 04/21/20 0957   ondansetron (ZOFRAN) tablet 4 mg  4 mg Oral Q6H PRN Opyd, Ilene Qua, MD       Or   ondansetron (ZOFRAN) injection 4 mg  4 mg Intravenous Q6H PRN Opyd, Ilene Qua, MD       senna-docusate (Senokot-S) tablet 1  tablet  1 tablet Oral QHS PRN Opyd, Ilene Qua, MD       sodium chloride flush (NS) 0.9 % injection 10-40 mL  10-40 mL Intracatheter Q12H Arrien, Jimmy Picket, MD   10 mL at 04/20/20 0954   sodium chloride flush (NS) 0.9 % injection 10-40 mL  10-40 mL Intracatheter PRN Arrien, Jimmy Picket, MD       thiamine tablet 100 mg  100 mg Oral Daily Arrien, Jimmy Picket, MD   100 mg at 04/21/20 0957   vitamin B-12 (CYANOCOBALAMIN) tablet 1,000 mcg  1,000 mcg Oral Daily Opyd, Ilene Qua, MD   1,000 mcg at 04/21/20 3567     Discharge Medications: Please see discharge summary for a list of discharge medications.  Relevant Imaging Results:  Relevant Lab Results:   Additional Information [id number]  Ayan Heffington, Marjie Skiff, RN

## 2020-04-22 ENCOUNTER — Inpatient Hospital Stay (HOSPITAL_COMMUNITY): Payer: Medicare HMO

## 2020-04-22 DIAGNOSIS — M7989 Other specified soft tissue disorders: Secondary | ICD-10-CM

## 2020-04-22 DIAGNOSIS — M6282 Rhabdomyolysis: Secondary | ICD-10-CM

## 2020-04-22 LAB — CBC
HCT: 31.7 % — ABNORMAL LOW (ref 36.0–46.0)
Hemoglobin: 10.4 g/dL — ABNORMAL LOW (ref 12.0–15.0)
MCH: 29.4 pg (ref 26.0–34.0)
MCHC: 32.8 g/dL (ref 30.0–36.0)
MCV: 89.5 fL (ref 80.0–100.0)
Platelets: 146 10*3/uL — ABNORMAL LOW (ref 150–400)
RBC: 3.54 MIL/uL — ABNORMAL LOW (ref 3.87–5.11)
RDW: 13.6 % (ref 11.5–15.5)
WBC: 6.1 10*3/uL (ref 4.0–10.5)
nRBC: 0 % (ref 0.0–0.2)

## 2020-04-22 LAB — COMPREHENSIVE METABOLIC PANEL
ALT: 34 U/L (ref 0–44)
AST: 65 U/L — ABNORMAL HIGH (ref 15–41)
Albumin: 2.4 g/dL — ABNORMAL LOW (ref 3.5–5.0)
Alkaline Phosphatase: 42 U/L (ref 38–126)
Anion gap: 7 (ref 5–15)
BUN: 15 mg/dL (ref 8–23)
CO2: 29 mmol/L (ref 22–32)
Calcium: 7.9 mg/dL — ABNORMAL LOW (ref 8.9–10.3)
Chloride: 102 mmol/L (ref 98–111)
Creatinine, Ser: 0.61 mg/dL (ref 0.44–1.00)
GFR calc Af Amer: >60 mL/min (ref >60)
GFR calc non Af Amer: >60 mL/min (ref >60)
Glucose, Bld: 100 mg/dL — ABNORMAL HIGH (ref 70–99)
Potassium: 3.2 mmol/L — ABNORMAL LOW (ref 3.5–5.1)
Sodium: 138 mmol/L (ref 135–145)
Total Bilirubin: 0.8 mg/dL (ref 0.3–1.2)
Total Protein: 5.3 g/dL — ABNORMAL LOW (ref 6.5–8.1)

## 2020-04-22 LAB — MYOGLOBIN, URINE: Myoglobin, Ur: 650 ng/mL — ABNORMAL HIGH (ref 0–13)

## 2020-04-22 LAB — SEDIMENTATION RATE: Sed Rate: 43 mm/hr — ABNORMAL HIGH (ref 0–22)

## 2020-04-22 LAB — CK: Total CK: 1249 U/L — ABNORMAL HIGH (ref 38–234)

## 2020-04-22 LAB — C-REACTIVE PROTEIN: CRP: 3.1 mg/dL — ABNORMAL HIGH (ref <1.0)

## 2020-04-22 MED ORDER — POTASSIUM CHLORIDE CRYS ER 20 MEQ PO TBCR
40.0000 meq | EXTENDED_RELEASE_TABLET | ORAL | Status: AC
  Administered 2020-04-22 (×2): 40 meq via ORAL
  Filled 2020-04-22 (×2): qty 2

## 2020-04-22 NOTE — Progress Notes (Signed)
Right lower extremity venous duplex has been completed. Preliminary results can be found in CV Proc through chart review.   04/22/20 10:21 AM Diane Snow RVT

## 2020-04-22 NOTE — Progress Notes (Signed)
Reason for consult: Bilateral lower extremity weakness  Subjective: Patient awake and alert, sitting in her chair.  Still has significant weakness in both lower legs  ROS: negative except above   Examination  Vital signs in last 24 hours: Temp:  [97.9 F (36.6 C)-98.7 F (37.1 C)] 98.6 F (37 C) (06/25 1409) Pulse Rate:  [70-92] 92 (06/25 1409) Resp:  [14-16] 16 (06/25 1409) BP: (140-154)/(69-84) 140/84 (06/25 1409) SpO2:  [97 %-100 %] 97 % (06/25 1409)  General: lying in be CVS: pulse-normal rate and rhythm RS: breathing comfortably Extremities: normal   Neuro: MS: Alert, oriented, follows commands CN: pupils equal and reactive,  EOMI, face symmetric, tongue midline, normal sensation over face, Motor: 5 out of 5 strength in both upper extremities, lower extremities 1 x 5 strength in bilateral hip extensors, flexors, adductors and abductors.  2/5 strength in bilateral knee flexion, 1/5 bilateral knee extension.  3-4/5 bilateral plantar flexion and extension. Increased tone left upper extremity. Reflexes: 2+ reflexes bilateral biceps and triceps, 1+ reflex in the left patella, absent in the right patella, absent ankle reflexes. Sensory: Symmetric and intact throughout. Coordination: Finger-to-nose bilaterally no dysmetria Gait: not tested  Basic Metabolic Panel: Recent Labs  Lab 04/18/20 1301 04/18/20 1301 04/19/20 0346 04/19/20 0346 04/20/20 0752 04/21/20 0823 04/22/20 0751  NA 142  --  141  --  139 139 138  K 3.5  --  3.6  --  3.3* 3.3* 3.2*  CL 108  --  111  --  107 105 102  CO2 21*  --  19*  --  25 23 29   GLUCOSE 167*  --  103*  --  92 92 100*  BUN 42*  --  51*  --  43* 23 15  CREATININE 1.90*  --  2.17*  --  1.25* 0.68 0.61  CALCIUM 8.5*   < > 8.2*   < > 7.6* 7.7* 7.9*   < > = values in this interval not displayed.    CBC: Recent Labs  Lab 04/17/20 2020 04/21/20 0823 04/22/20 0531  WBC 10.6* 6.3 6.1  HGB 16.5* 10.6* 10.4*  HCT 50.6* 32.4* 31.7*  MCV  90.8 90.5 89.5  PLT 203 129* 146*     Coagulation Studies: No results for input(s): LABPROT, INR in the last 72 hours.  Reviewed MRI brain-no acute findings.  Motion degraded exam as she does have chronic white matter disease, so it is possible for patient to have remote right lacunar infarct. MRI C-spine does not show any evidence of demyelination or inflammation/cord compression.    CK trending downwards and kidney function has normalized.   ASSESSMENT AND PLAN  74 year old female with history of hypertension, arthritis and chronic left leg weakness since 2019 presents to the emergency department after being found on leaning backwards in a chair for prolonged period of time-approximately 18 hours. Presented with rhabdomyolysis and AKI and on examination has significant weakness in bilateral lower extremity proximal greater than distal muscles.  Neurology consulted for further evaluation. Her gait difficulty apparently has been chronic and there was some plan for PCP to refer to a neurologist for possible parkinsonism.  The patient herself is a poor historian states that all she can recollect is being on the floor.  She also states that she has chronic left leg weakness since 2019 and drags her feet, but is much stronger than this can usually walk with a cane.  Patient has complex neurological exam findings - left facial droop-no longer  as prominent, left arm spasticity, bilateral lower extremity proximal muscle weakness significantly more than distal weakness, normal sensation in bilateral feet-reduced sensation to light touch over right thigh (no longer present today)    Bilateral leg weakness/rhabdomyolysis: Proximal muscle weakness greater than distal muscles favors myopathy rather than neuropathy/AIDP which usually affects distal muscles initially.  Also sensation mostly intact.   Muscle weakness may be due to rhabdomyolysis, less likely chronic myopathy/myositis as CK trending  downwards and renal function is improved.  Left side upper extremity increased tone may be due to cogwheel for Parkinson's disease versus remote lacunar infarct.  Right thigh paresthesia, mostly resolved: Likely due to compression of lateral femoral cutaneous nerve  Recurrent falls:  Possibly from Parkinson's disease-family states she has shuffling gait.  Plan -Consider Sinemet 25/100 3 times daily for possible Parkinson's disease -Continue to trend CK, will defer muscle biopsy for now.  If patient's weakness continues to worsen or CK starts to trend up again-then consider work-up for inflammatory/autoimmune myositis. -Aggressive PT/OT  Spent > 35 minutes in evaluation and treatment of this patient including discussion with family members.  Karena Addison Tynisha Ogan [facility] Pager Number [phone] For questions after 7pm please refer to AMION to reach the Neurologist on call

## 2020-04-22 NOTE — Care Management Important Message (Signed)
Important Message  Patient Details IM Letter given to Marney Doctor RN Case Manager to present to the Patient Name: Phala Schraeder MRN: [id number] Date of Birth: 1946-07-16   Medicare Important Message Given:  Yes     Kerin Salen 04/22/2020, 9:57 AM

## 2020-04-22 NOTE — TOC Progression Note (Addendum)
Transition of Care [facility]) - Progression Note    Patient Details  Name: Diane Snow MRN: [id number] Date of Birth: 1945/11/08  [facility] CM/SW Contact  Adreena Willits, Marjie Skiff, RN Phone Number: 04/22/2020, 1:07 PM  Clinical Narrative:    Bed offers given to pt and family. [facility] chosen. [facility] liaison contacted to start insurance auth with [facility]. Will await insurance auth. Passr# [id number] A. TOC will continue to follow.   Expected Discharge Plan: [place] Barriers to Discharge: Continued Medical Work up  Expected Discharge Plan and Services Expected Discharge Plan: [place] In-house Referral: Clinical Social Work   Post Acute Care Choice: [place] arrangements for the past 2 months: Single Family Home                                       Social Determinants of Health (SDOH) Interventions    Readmission Risk Interventions No flowsheet data found.

## 2020-04-22 NOTE — Progress Notes (Signed)
PROGRESS NOTE    Alin Hutchins  DTO:[id number] DOB: 10/12/1946 DOA: 04/17/2020 PCP: Mack Hook, MD   Brief Narrative: Diane Snow is a 74 y.o. female with a history of hypertension, LE edema. She presented after being found down on a chair. She was found to have evidence of rhabdomyolysis and resultant AKI. She currently has persistent LE weakness.   Assessment & Plan:   Principal Problem:   Rhabdomyolysis Active Problems:   Essential hypertension   Generalized weakness   AKI (acute kidney injury) ([place])   Leg swelling   Ambulatory dysfunction   Pressure injury of skin   Rhabdomyolysis (non-traumatic) Patient found in awkward positioning at home. Patient was in this position for several hours, hanging of a chair sideways with her back on the seat. CK on admission of 18k and has not significantly improved with IV fluids. Associated AKI which has also improved. -Discontinue IV fluids  AKI Baseline creatinine of 0.89. Creatinine of 1.33 on admission with a peak of 2.17. Improved and back to baseline.  Lower extremity weakness Possibly in relation to rhabdomyolysis. However, there is concern for possible neurologic etiology. Thoracic and lumbar spine unrevealing for etiology. Neurology consulted and recommending MRI cervical spine and brain significant for no acute stroke; some cervical stenosis. -Neurology recommendations: Considering muscle biopsy, recommendations pending today -PT recommendations: SNF  Possibly Parkinson disease Patient with outpatient neurology follow-up in July.  Essential hypertension Mostly controlled.  Lower extremity swelling Initial concern for possible cardiac etiology. Transthoracic Echocardiogram significant for LVH and grade 1 diastolic dysfunction. According to patient, swelling is stable for many years. Right leg appears much more swollen -Elevate legs as much as able -R LE Duplex to rule out DVT  Pyuria No evidence of UTI.  No symptoms.  Malnutrition Unknown severity.  -Continue Ensure  Pressure injury Left buttock, stage 2, POA    DVT prophylaxis: Lovenox Code Status:   Code Status: Full Code Family Communication: Cousin on telephone (10 minutes) Disposition Plan: Discharge to SNF ([facility]IR declined admission) in several days pending ability to ambulate and neurology recommendations   Consultants:   Neurology  Procedures:   TRANSTHORACIC ECHOCARDIOGRAM (04/18/2020) IMPRESSIONS    1. Hyperdynamic LV systolic function; moderate LVH; intracavitary  gradient of 2.3 m/s likely due to hyperdynamic function; moderate LVH;  grade 1 diastolic dysfunction.  2. Left ventricular ejection fraction, by estimation, is >75%. The left  ventricle has hyperdynamic function. The left ventricle has no regional  wall motion abnormalities. There is moderate left ventricular hypertrophy.  Left ventricular diastolic parameters are consistent with Grade I diastolic dysfunction (impaired  relaxation).  3. Right ventricular systolic function is normal. The right ventricular  size is normal.  4. The mitral valve is normal in structure. No evidence of mitral valve  regurgitation. No evidence of mitral stenosis.  5. The aortic valve is tricuspid. Aortic valve regurgitation is not  visualized. Mild aortic valve sclerosis is present, with no evidence of  aortic valve stenosis.  6. The inferior vena cava is normal in size with greater than 50%  respiratory variability, suggesting right atrial pressure of 3 mmHg.   Antimicrobials:  Ceftriaxone   Subjective: Patient still having weakness in her legs. Increased swelling of right leg.  Objective: Vitals:   04/21/20 0603 04/21/20 1425 04/21/20 2115 04/22/20 0612  BP: (!) 153/77 138/79 140/69 (!) 154/77  Pulse: 63 79 70 70  Resp: 14 18 14 14   Temp: 98.3 F (36.8 C) 98.6 F (37 C) 98.7  F (37.1 C) 97.9 F (36.6 C)  TempSrc: Oral Oral Oral Oral  SpO2: 98% 100%  100% 97%  Weight:      Height:        Intake/Output Summary (Last 24 hours) at 04/22/2020 1032 Last data filed at 04/22/2020 2297 Gross per 24 hour  Intake 1800 ml  Output 900 ml  Net 900 ml   Filed Weights   04/17/20 2230 04/20/20 0500  Weight: 59 kg 64.8 kg    Examination:  General exam: Appears calm and comfortable Respiratory system: Clear to auscultation. Respiratory effort normal. Cardiovascular system: S1 & S2 heard, RRR. No murmurs, rubs, gallops or clicks. Gastrointestinal system: Abdomen is nondistended, soft and nontender. No organomegaly or masses felt. Normal bowel sounds heard. Central nervous system: Alert and oriented. 1-2/5 LE weakness Musculoskeletal: R>L LE edema. No calf tenderness Skin: No cyanosis. No rashes Psychiatry: Judgement and insight appear normal. Mood & affect appropriate.      Data Reviewed: I have personally reviewed following labs and imaging studies  CBC Lab Results  Component Value Date   WBC 6.1 04/22/2020   RBC 3.54 (L) 04/22/2020   HGB 10.4 (L) 04/22/2020   HCT 31.7 (L) 04/22/2020   MCV 89.5 04/22/2020   MCH 29.4 04/22/2020   PLT 146 (L) 04/22/2020   MCHC 32.8 04/22/2020   RDW 13.6 04/22/2020   LYMPHSABS 1.5 03/22/2020   EOSABS 0.0 03/22/2020   BASOSABS 0.0 98/92/1194     Last metabolic panel Lab Results  Component Value Date   NA 138 04/22/2020   K 3.2 (L) 04/22/2020   CL 102 04/22/2020   CO2 29 04/22/2020   BUN 15 04/22/2020   CREATININE 0.61 04/22/2020   GLUCOSE 100 (H) 04/22/2020   GFRNONAA >60 04/22/2020   GFRAA >60 04/22/2020   CALCIUM 7.9 (L) 04/22/2020   PROT 5.3 (L) 04/22/2020   ALBUMIN 2.4 (L) 04/22/2020   LABGLOB 3.3 04/12/2020   AGRATIO 1.2 04/12/2020   BILITOT 0.8 04/22/2020   ALKPHOS 42 04/22/2020   AST 65 (H) 04/22/2020   ALT 34 04/22/2020   ANIONGAP 7 04/22/2020    CBG (last 3)  No results for input(s): GLUCAP in the last 72 hours.   GFR: Estimated Creatinine Clearance: 49.2 mL/min (by  C-G formula based on SCr of 0.61 mg/dL).  Coagulation Profile: No results for input(s): INR, PROTIME in the last 168 hours.  Recent Results (from the past 240 hour(s))  Urine culture     Status: None   Collection Time: 04/18/20 12:07 AM   Specimen: Urine, Random  Result Value Ref Range Status   Specimen Description   Final    URINE, RANDOM Performed at [facility] [address] [place], [place] [address]    Special Requests   Final    NONE Performed at [facility], [place] [address], [place], [place] [address]    Culture   Final    NO GROWTH Performed at [facility], [place] [address], [place], [place] [address]    Report Status 04/19/2020 FINAL  Final  SARS Coronavirus 2 by RT PCR (hospital order, performed in [facility] hospital lab) Nasopharyngeal Nasopharyngeal Swab     Status: None   Collection Time: 04/18/20  2:05 AM   Specimen: Nasopharyngeal Swab  Result Value Ref Range Status   SARS Coronavirus 2 NEGATIVE NEGATIVE Final    Comment: (NOTE) SARS-CoV-2 target nucleic acids are NOT DETECTED.  The SARS-CoV-2 RNA is generally  detectable in upper and lower respiratory specimens during the acute phase of infection. The lowest concentration of SARS-CoV-2 viral copies this assay can detect is 250 copies / mL. A negative result does not preclude SARS-CoV-2 infection and should not be used as the sole basis for treatment or other patient management decisions.  A negative result may occur with improper specimen collection / handling, submission of specimen other than nasopharyngeal swab, presence of viral mutation(s) within the areas targeted by this assay, and inadequate number of viral copies (<250 copies / mL). A negative result must be combined with clinical observations, patient history, and epidemiological information.  Fact Sheet for Patients:   StrictlyIdeas.no  Fact Sheet for Healthcare  Providers: BankingDealers.co.za  This test is not yet approved or  cleared by the [place] FDA and has been authorized for detection and/or diagnosis of SARS-CoV-2 by FDA under an Emergency Use Authorization (EUA).  This EUA will remain in effect (meaning this test can be used) for the duration of the COVID-19 declaration under Section 564(b)(1) of the Act, 21 U.S.C. section 360bbb-3(b)(1), unless the authorization is terminated or revoked sooner.  Performed at [facility], [place] [address], [place], [place] [address]         Radiology Studies: MR BRAIN WO CONTRAST  Result Date: 04/20/2020 CLINICAL DATA:  Stroke, follow-up. Progressive bilateral lower extremity weakness. EXAM: MRI HEAD WITHOUT CONTRAST TECHNIQUE: Multiplanar, multiecho pulse sequences of the brain and surrounding structures were obtained without intravenous contrast. COMPARISON:  Head CT 04/17/2020 FINDINGS: Multiple sequences are moderately motion degraded. Brain: There is no evidence of acute infarct, intracranial hemorrhage, mass, midline shift, or extra-axial fluid collection. Patchy T2 hyperintensities in the cerebral white matter bilaterally are nonspecific but compatible with mild-to-moderate chronic small vessel ischemic disease. Mild cerebral atrophy is within normal limits for age. Vascular: Major intracranial vascular flow voids are preserved. Skull and upper cervical spine: No suspicious marrow lesion. Sinuses/Orbits: Unremarkable orbits. Trace mucosal thickening in the maxillary sinuses. Clear mastoid air cells. Other: None. IMPRESSION: 1. No acute intracranial abnormality. 2. Mild-to-moderate chronic small vessel ischemic disease. Electronically Signed   By: Logan Bores M.D.   On: 04/20/2020 16:59   MR CERVICAL SPINE WO CONTRAST  Result Date: 04/20/2020 CLINICAL DATA:  Ataxia, cervical spine trauma. Bilateral lower extremity weakness. EXAM: MRI CERVICAL SPINE  WITHOUT CONTRAST TECHNIQUE: Multiplanar, multisequence MR imaging of the cervical spine was performed. No intravenous contrast was administered. COMPARISON:  Cervical spine CT 04/17/2020 FINDINGS: The study is moderately motion degraded. Alignment: No significant listhesis is evident within limitations of motion artifact. Vertebrae: No fracture, suspicious osseous lesion, or significant marrow edema. Cord: Limited assessment due to motion. No gross cord signal abnormality. Posterior Fossa, vertebral arteries, paraspinal tissues: Unremarkable. Disc levels: C2-3: Negative. C3-4: Disc bulging and moderate left facet arthrosis result in mild spinal stenosis and borderline to mild left neural foraminal stenosis. C4-5: Broad-based posterior disc osteophyte complex and mild facet arthrosis result in moderate spinal stenosis with mild cord flattening and mild-to-moderate bilateral neural foraminal stenosis. C5-6: Broad-based posterior disc osteophyte complex, infolding of the ligamentum flavum, and mild facet arthrosis result in moderate spinal stenosis and mild right neural foraminal stenosis. C6-7: Mild disc bulging results in mild spinal stenosis without significant neural foraminal stenosis. C7-T1: Partially calcified central to right paracentral disc protrusion without evidence of significant stenosis. IMPRESSION: 1. Motion degraded examination. 2. Multilevel cervical disc degeneration with moderate spinal stenosis at C4-5 and C5-6. 3. Mild spinal stenosis at C3-4  and C6-7. 4. No gross spinal cord signal abnormality. Electronically Signed   By: Logan Bores M.D.   On: 04/20/2020 16:53   DG Foot 2 Views Left  Result Date: 04/20/2020 CLINICAL DATA:  Toe pain pain at third digit EXAM: LEFT FOOT - 2 VIEW COMPARISON:  09/10/2018 FINDINGS: No acute displaced fracture or malalignment. Chronic distal fifth toe deformity with prior partial resection of the metatarsal. No osseous erosive change or bony destruction.  IMPRESSION: No acute osseous abnormality. Chronic appearing deformity involving the fifth digit distally. Electronically Signed   By: Donavan Foil M.D.   On: 04/20/2020 15:30   VAS [place] LOWER EXTREMITY VENOUS (DVT)  Result Date: 04/22/2020  Lower Venous DVTStudy Indications: Swelling.  Risk Factors: None identified. Limitations: Poor ultrasound/tissue interface. Comparison Study: No prior studies. Performing Technologist: Oliver Hum RVT  Examination Guidelines: A complete evaluation includes B-mode imaging, spectral Doppler, color Doppler, and power Doppler as needed of all accessible portions of each vessel. Bilateral testing is considered an integral part of a complete examination. Limited examinations for reoccurring indications may be performed as noted. The reflux portion of the exam is performed with the patient in reverse Trendelenburg.  +---------+---------------+---------+-----------+----------+--------------+ RIGHT    CompressibilityPhasicitySpontaneityPropertiesThrombus Aging +---------+---------------+---------+-----------+----------+--------------+ CFV      Full           Yes      Yes                                 +---------+---------------+---------+-----------+----------+--------------+ SFJ      Full                                                        +---------+---------------+---------+-----------+----------+--------------+ FV Prox  Full                                                        +---------+---------------+---------+-----------+----------+--------------+ FV Mid   Full                                                        +---------+---------------+---------+-----------+----------+--------------+ FV DistalFull                                                        +---------+---------------+---------+-----------+----------+--------------+ PFV      Full                                                         +---------+---------------+---------+-----------+----------+--------------+ POP      Full           Yes      Yes                                 +---------+---------------+---------+-----------+----------+--------------+  PTV      Full                                                        +---------+---------------+---------+-----------+----------+--------------+ PERO     Full                                                        +---------+---------------+---------+-----------+----------+--------------+   +----+---------------+---------+-----------+----------+--------------+ LEFTCompressibilityPhasicitySpontaneityPropertiesThrombus Aging +----+---------------+---------+-----------+----------+--------------+ CFV Full           Yes      Yes                                 +----+---------------+---------+-----------+----------+--------------+     Summary: RIGHT: - There is no evidence of deep vein thrombosis in the lower extremity.  - No cystic structure found in the popliteal fossa.  LEFT: - No evidence of common femoral vein obstruction.  *See table(s) above for measurements and observations.    Preliminary         Scheduled Meds: . aspirin EC  81 mg Oral Daily  . calcium carbonate  1 tablet Oral TID WC  . Chlorhexidine Gluconate Cloth  6 each Topical Daily  . enoxaparin (LOVENOX) injection  40 mg Subcutaneous Q24H  . feeding supplement (ENSURE ENLIVE)  237 mL Oral BID BM  . multivitamin  1 tablet Oral Daily  . sodium chloride flush  10-40 mL Intracatheter Q12H  . thiamine  100 mg Oral Daily  . vitamin B-12  1,000 mcg Oral Daily   Continuous Infusions: . sodium chloride    . lactated ringers 100 mL/hr at 04/22/20 0603     LOS: 4 days     Cordelia Poche, MD [facility] 04/22/2020, 10:32 AM  If 7PM-7AM, please contact night-coverage [email or web address]

## 2020-04-23 DIAGNOSIS — G250 Essential tremor: Secondary | ICD-10-CM | POA: Diagnosis not present

## 2020-04-23 DIAGNOSIS — S21209A Unspecified open wound of unspecified back wall of thorax without penetration into thoracic cavity, initial encounter: Secondary | ICD-10-CM | POA: Diagnosis not present

## 2020-04-23 DIAGNOSIS — M7989 Other specified soft tissue disorders: Secondary | ICD-10-CM | POA: Diagnosis not present

## 2020-04-23 DIAGNOSIS — D649 Anemia, unspecified: Secondary | ICD-10-CM | POA: Diagnosis not present

## 2020-04-23 DIAGNOSIS — R4189 Other symptoms and signs involving cognitive functions and awareness: Secondary | ICD-10-CM | POA: Diagnosis not present

## 2020-04-23 DIAGNOSIS — S21209D Unspecified open wound of unspecified back wall of thorax without penetration into thoracic cavity, subsequent encounter: Secondary | ICD-10-CM | POA: Diagnosis not present

## 2020-04-23 DIAGNOSIS — R799 Abnormal finding of blood chemistry, unspecified: Secondary | ICD-10-CM | POA: Diagnosis not present

## 2020-04-23 DIAGNOSIS — M6281 Muscle weakness (generalized): Secondary | ICD-10-CM | POA: Diagnosis not present

## 2020-04-23 DIAGNOSIS — Z780 Asymptomatic menopausal state: Secondary | ICD-10-CM | POA: Diagnosis not present

## 2020-04-23 DIAGNOSIS — M2550 Pain in unspecified joint: Secondary | ICD-10-CM | POA: Diagnosis not present

## 2020-04-23 DIAGNOSIS — E611 Iron deficiency: Secondary | ICD-10-CM | POA: Diagnosis not present

## 2020-04-23 DIAGNOSIS — S31829D Unspecified open wound of left buttock, subsequent encounter: Secondary | ICD-10-CM | POA: Diagnosis not present

## 2020-04-23 DIAGNOSIS — R8281 Pyuria: Secondary | ICD-10-CM | POA: Diagnosis not present

## 2020-04-23 DIAGNOSIS — L8992 Pressure ulcer of unspecified site, stage 2: Secondary | ICD-10-CM | POA: Diagnosis not present

## 2020-04-23 DIAGNOSIS — R262 Difficulty in walking, not elsewhere classified: Secondary | ICD-10-CM | POA: Diagnosis not present

## 2020-04-23 DIAGNOSIS — R7309 Other abnormal glucose: Secondary | ICD-10-CM | POA: Diagnosis not present

## 2020-04-23 DIAGNOSIS — L8946 Pressure-induced deep tissue damage of contiguous site of back, buttock and hip: Secondary | ICD-10-CM | POA: Diagnosis not present

## 2020-04-23 DIAGNOSIS — M50221 Other cervical disc displacement at C4-C5 level: Secondary | ICD-10-CM | POA: Diagnosis not present

## 2020-04-23 DIAGNOSIS — Z20828 Contact with and (suspected) exposure to other viral communicable diseases: Secondary | ICD-10-CM | POA: Diagnosis not present

## 2020-04-23 DIAGNOSIS — M47812 Spondylosis without myelopathy or radiculopathy, cervical region: Secondary | ICD-10-CM | POA: Diagnosis not present

## 2020-04-23 DIAGNOSIS — E039 Hypothyroidism, unspecified: Secondary | ICD-10-CM | POA: Diagnosis not present

## 2020-04-23 DIAGNOSIS — Z20822 Contact with and (suspected) exposure to covid-19: Secondary | ICD-10-CM | POA: Diagnosis not present

## 2020-04-23 DIAGNOSIS — M79676 Pain in unspecified toe(s): Secondary | ICD-10-CM | POA: Diagnosis not present

## 2020-04-23 DIAGNOSIS — N179 Acute kidney failure, unspecified: Secondary | ICD-10-CM | POA: Diagnosis not present

## 2020-04-23 DIAGNOSIS — E559 Vitamin D deficiency, unspecified: Secondary | ICD-10-CM | POA: Diagnosis not present

## 2020-04-23 DIAGNOSIS — I10 Essential (primary) hypertension: Secondary | ICD-10-CM | POA: Diagnosis not present

## 2020-04-23 DIAGNOSIS — M6282 Rhabdomyolysis: Secondary | ICD-10-CM | POA: Diagnosis not present

## 2020-04-23 DIAGNOSIS — E785 Hyperlipidemia, unspecified: Secondary | ICD-10-CM | POA: Diagnosis not present

## 2020-04-23 DIAGNOSIS — F331 Major depressive disorder, recurrent, moderate: Secondary | ICD-10-CM | POA: Diagnosis not present

## 2020-04-23 DIAGNOSIS — E46 Unspecified protein-calorie malnutrition: Secondary | ICD-10-CM | POA: Diagnosis not present

## 2020-04-23 DIAGNOSIS — Z03818 Encounter for observation for suspected exposure to other biological agents ruled out: Secondary | ICD-10-CM | POA: Diagnosis not present

## 2020-04-23 DIAGNOSIS — S31829A Unspecified open wound of left buttock, initial encounter: Secondary | ICD-10-CM | POA: Diagnosis not present

## 2020-04-23 DIAGNOSIS — R609 Edema, unspecified: Secondary | ICD-10-CM | POA: Diagnosis not present

## 2020-04-23 DIAGNOSIS — L89159 Pressure ulcer of sacral region, unspecified stage: Secondary | ICD-10-CM | POA: Diagnosis not present

## 2020-04-23 DIAGNOSIS — L602 Onychogryphosis: Secondary | ICD-10-CM | POA: Diagnosis not present

## 2020-04-23 DIAGNOSIS — M25551 Pain in right hip: Secondary | ICD-10-CM | POA: Diagnosis not present

## 2020-04-23 DIAGNOSIS — N882 Stricture and stenosis of cervix uteri: Secondary | ICD-10-CM | POA: Diagnosis not present

## 2020-04-23 DIAGNOSIS — R5381 Other malaise: Secondary | ICD-10-CM | POA: Diagnosis not present

## 2020-04-23 DIAGNOSIS — R41841 Cognitive communication deficit: Secondary | ICD-10-CM | POA: Diagnosis not present

## 2020-04-23 DIAGNOSIS — E538 Deficiency of other specified B group vitamins: Secondary | ICD-10-CM | POA: Diagnosis not present

## 2020-04-23 DIAGNOSIS — R29898 Other symptoms and signs involving the musculoskeletal system: Secondary | ICD-10-CM | POA: Diagnosis not present

## 2020-04-23 DIAGNOSIS — R531 Weakness: Secondary | ICD-10-CM | POA: Diagnosis not present

## 2020-04-23 DIAGNOSIS — M4802 Spinal stenosis, cervical region: Secondary | ICD-10-CM | POA: Diagnosis not present

## 2020-04-23 DIAGNOSIS — M1711 Unilateral primary osteoarthritis, right knee: Secondary | ICD-10-CM | POA: Diagnosis not present

## 2020-04-23 DIAGNOSIS — N390 Urinary tract infection, site not specified: Secondary | ICD-10-CM | POA: Diagnosis not present

## 2020-04-23 DIAGNOSIS — M85832 Other specified disorders of bone density and structure, left forearm: Secondary | ICD-10-CM | POA: Diagnosis not present

## 2020-04-23 DIAGNOSIS — M1712 Unilateral primary osteoarthritis, left knee: Secondary | ICD-10-CM | POA: Diagnosis not present

## 2020-04-23 DIAGNOSIS — Z7401 Bed confinement status: Secondary | ICD-10-CM | POA: Diagnosis not present

## 2020-04-23 DIAGNOSIS — L02818 Cutaneous abscess of other sites: Secondary | ICD-10-CM | POA: Diagnosis not present

## 2020-04-23 DIAGNOSIS — M48061 Spinal stenosis, lumbar region without neurogenic claudication: Secondary | ICD-10-CM | POA: Diagnosis not present

## 2020-04-23 DIAGNOSIS — R229 Localized swelling, mass and lump, unspecified: Secondary | ICD-10-CM | POA: Diagnosis not present

## 2020-04-23 DIAGNOSIS — R2681 Unsteadiness on feet: Secondary | ICD-10-CM | POA: Diagnosis not present

## 2020-04-23 DIAGNOSIS — R269 Unspecified abnormalities of gait and mobility: Secondary | ICD-10-CM | POA: Diagnosis not present

## 2020-04-23 DIAGNOSIS — R2689 Other abnormalities of gait and mobility: Secondary | ICD-10-CM | POA: Diagnosis not present

## 2020-04-23 DIAGNOSIS — M50223 Other cervical disc displacement at C6-C7 level: Secondary | ICD-10-CM | POA: Diagnosis not present

## 2020-04-23 LAB — CK: Total CK: 862 U/L — ABNORMAL HIGH (ref 38–234)

## 2020-04-23 LAB — MAGNESIUM: Magnesium: 1.8 mg/dL (ref 1.7–2.4)

## 2020-04-23 LAB — SARS CORONAVIRUS 2 (TAT 6-24 HRS): SARS Coronavirus 2: NEGATIVE

## 2020-04-23 MED ORDER — DICLOFENAC SODIUM 1 % EX GEL
4.0000 g | Freq: Four times a day (QID) | CUTANEOUS | Status: DC
  Administered 2020-04-23: 4 g via TOPICAL
  Filled 2020-04-23: qty 100

## 2020-04-23 MED ORDER — PROSIGHT PO TABS: 1.0000 | ORAL_TABLET | Freq: Every day | ORAL | Status: AC

## 2020-04-23 MED ORDER — ENSURE ENLIVE PO LIQD: 237.0000 mL | Freq: Two times a day (BID) | ORAL | Status: AC

## 2020-04-23 MED ORDER — THIAMINE HCL 100 MG PO TABS: 100.0000 mg | ORAL_TABLET | Freq: Every day | ORAL | Status: AC

## 2020-04-23 NOTE — Discharge Summary (Signed)
Physician Discharge Summary  Diane Snow YWV:[id number] DOB: 05/16/1946 DOA: 04/17/2020  PCP: Mack Hook, MD  Admit date: 04/17/2020 Discharge date: 04/23/2020  Admitted From: Home Disposition: SNF  Recommendations for Outpatient Follow-up:  1. Follow up with PCP in 1 week 2. Follow up with neurology with regard to concern for Parkinson disease 3. Please follow up on the following pending results: None   Discharge Condition: Stable CODE STATUS: Full code Diet recommendation: Heart healthy   Brief/Interim Summary:  Admission HPI written by Tawni Millers, MD   Chief Complaint: Weakness   HPI: Diane Snow is a 74 y.o. female with medical history significant for hypertension and bilateral lower extremity weakness, presenting to the emergency department for evaluation of progressive weakness.  Patient complains of general weakness that is most notable in the bilateral lower extremities but worse on the left.  This has been going on for at least a year now but it has been worsening to the point where she can no longer ambulate with her walker as she had been.  There was no fall or trauma preceding the worsening and she had been evaluated with MRI that was negative for acute findings in March.  She also reported a resting tremor, has been noted to have stiff joints, masked facies, and gait abnormality that raised concern for Parkinson's and she is being referred to neurology by her PCP for evaluation of this.  Patient also reports some bilateral lower extremity swelling that developed over the past month or 2, has not improved despite stopping her amlodipine, and has not improved with Lasix either.  She denies headache, change in vision or hearing, fevers, chills, chest pain, dysuria, flank pain, or hematuria.  ED Course: Upon arrival to the ED, patient is found to be afebrile, saturating well on room air, and with stable blood pressure.  EKG features sinus rhythm  and chest x-rays negative for acute cardiopulmonary disease.  Noncontrast head CT is negative for acute intracranial abnormality and there is no acute fracture or subluxation on cervical spine CT.  Chemistry panel is notable for creatinine 1.33, up from 0.89 a few days earlier.  AST is elevated to 156.  She has a new mild polycythemia and mild leukocytosis.  BNP is elevated 271.  Patient was given a liter of saline and 1 g IV Rocephin in the ED.  COVID-19 screening test has not yet resulted.   Hospital course:  Rhabdomyolysis (non-traumatic) Patient found in awkward positioning at home. Patient was in this position for several hours, hanging of a chair sideways with her back on the seat. CK on admission of 18k and has not significantly improved with IV fluids. Associated AKI which has also improved. -Discontinue IV fluids  AKI Baseline creatinine of 0.89. Creatinine of 1.33 on admission with a peak of 2.17. Improved and back to baseline.  Lower extremity weakness Possibly in relation to rhabdomyolysis. However, there is concern for possible neurologic etiology. Thoracic and lumbar spine unrevealing for etiology. Neurology consulted and recommending MRI cervical spine and brain significant for no acute stroke; some cervical stenosis. Possibility this is secondary to rhabdomyolysis vs Parkinson disease. Neurology recommended to consider starting Sinemet 25/100 TID, however patient declined at this moment. If patient's rehab is hindered by spasticity, could consider starting this regimen and watching for side effects. Patient has neurology follow-up with an outpatient physician prior to admission.  Possibly Parkinson disease Patient with outpatient neurology follow-up in July.  Essential hypertension Mostly controlled.  Lower extremity swelling Initial concern for possible cardiac etiology. Transthoracic Echocardiogram significant for LVH and grade 1 diastolic dysfunction. According to  patient, swelling is stable for many years. Right leg appears much more swollen. RLE duplex was negative for DVT. Paitent was on lasix as an outpatient. Can continue on discharge.  Pyuria No evidence of UTI. No symptoms.  Malnutrition Unknown severity. Continue Ensure, multivitamins.  Pressure injury  Pressure Injury 04/19/20 Buttocks Left Stage 2 -  Partial thickness loss of dermis presenting as a shallow open injury with a red, pink wound bed without slough. skin tear, one large w two surrounding tears (Active)  04/19/20 1849  Location: Buttocks  Location Orientation: Left  Staging: Stage 2 -  Partial thickness loss of dermis presenting as a shallow open injury with a red, pink wound bed without slough.  Wound Description (Comments): skin tear, one large w two surrounding tears  Present on Admission: Yes (present on transfer to unit)     Pressure Injury 04/21/20 Vertebral column Medial Deep Tissue Pressure Injury - Purple or maroon localized area of discolored intact skin or blood-filled blister due to damage of underlying soft tissue from pressure and/or shear. assessment per [place] nurse; (Active)  04/21/20 0950  Location: Vertebral column  Location Orientation: Medial  Staging: Deep Tissue Pressure Injury - Purple or maroon localized area of discolored intact skin or blood-filled blister due to damage of underlying soft tissue from pressure and/or shear.  Wound Description (Comments): assessment per [place]; recognized at the time of admission as abrasion  Present on Admission: Yes    Dressing/care instructions below.    Discharge Diagnoses:  Principal Problem:   Rhabdomyolysis Active Problems:   Essential hypertension   Generalized weakness   AKI (acute kidney injury) ([place])   Leg swelling   Ambulatory dysfunction   Pressure injury of skin    Discharge Instructions  Discharge Instructions    Diet - low sodium heart healthy   Complete by: As directed    Discharge  wound care:   Complete by: As directed    Comments: Silicone foam dressings to the mid back and left buttock wound; change every 3 days. Assess under dressings each shift for any acute changes in the wounds.   Face-to-face encounter (required for Medicare/Medicaid patients)   Complete by: As directed    I Mirna Mires certify that this patient is under my care and that I, or a nurse practitioner or physician's assistant working with me, had a face-to-face encounter that meets the physician face-to-face encounter requirements with this patient on 04/17/2020. The encounter with the patient was in whole, or in part for the following medical condition(s) which is the primary reason for home health care (List medical condition): generalized weakness, deconditioning, impaired mobility   The encounter with the patient was in whole, or in part, for the following medical condition, which is the primary reason for home health care: generalized weakness, deconditioning, impaired mobility   I certify that, based on my findings, the following services are medically necessary home health services:  Nursing Physical therapy     Reason for Medically Necessary Home Health Services:  Skilled Nursing- Change/Decline in Patient Status Therapy- Instruction on use of Assistive Device for Ambulation on all Surfaces     My clinical findings support the need for the above services: Unable to leave home safely without assistance and/or assistive device   Further, I certify that my clinical findings support that this patient is homebound due  to: Unable to leave home safely without assistance   Home Health   Complete by: As directed    To provide the following care/treatments:  PT [place] work     Increase activity slowly   Complete by: As directed      Allergies as of 04/23/2020      Reactions   Aspirin Other (See Comments)   More than an 81 mg asa makes pt gag      Medication List    TAKE  these medications   aspirin 81 MG tablet Take 81 mg by mouth daily.   diclofenac sodium 1 % Gel Commonly known as: Voltaren Apply 4 g topically 4 (four) times daily.   feeding supplement (ENSURE ENLIVE) Liqd Take 237 mLs by mouth 2 (two) times daily between meals.   furosemide 20 MG tablet Commonly known as: LASIX Take 20 mg by mouth daily.   metoprolol succinate 50 MG 24 hr tablet Commonly known as: TOPROL-XL Take 1 tablet (50 mg total) by mouth daily. Take with or immediately following a meal.   multivitamin Tabs tablet Take 1 tablet by mouth daily. Start taking on: April 24, 2020   potassium chloride 10 MEQ tablet Commonly known as: KLOR-CON Take 10 mEq by mouth daily.   thiamine 100 MG tablet Take 1 tablet (100 mg total) by mouth daily. Start taking on: April 24, 2020   vitamin B-12 1000 MCG tablet Commonly known as: CYANOCOBALAMIN Take 1 tablet (1,000 mcg total) by mouth daily.            Discharge Care Instructions  (From admission, onward)         Start     Ordered   04/23/20 0000  Discharge wound care:       Comments: Comments: Silicone foam dressings to the mid back and left buttock wound; change every 3 days. Assess under dressings each shift for any acute changes in the wounds.   04/23/20 1141          Allergies  Allergen Reactions  . Aspirin Other (See Comments)    More than an 81 mg asa makes pt gag    Consultations:  Neurology   Procedures/Studies: CT HEAD WO CONTRAST  Result Date: 04/17/2020 CLINICAL DATA:  Headache, post traumatic Patient reports weakness. EXAM: CT HEAD WITHOUT CONTRAST TECHNIQUE: Contiguous axial images were obtained from the base of the skull through the vertex without intravenous contrast. COMPARISON:  Head CT 01/12/2020 FINDINGS: Brain: No intracranial hemorrhage, mass effect, or midline shift. Generalized atrophy and chronic small vessel ischemia, stable from prior. 3 No hydrocephalus. The basilar cisterns are  patent. No evidence of territorial infarct or acute ischemia. No extra-axial or intracranial fluid collection. Vascular: Atherosclerosis of skullbase vasculature without hyperdense vessel or abnormal calcification. Skull: No fracture or focal lesion. Sinuses/Orbits: Mucosal thickening throughout ethmoid air cells and involving bilateral maxillary sinuses, chronic but slightly progressed from prior. No sinus fluid level. Mastoid air cells are clear. Other: Subcutaneous soft tissue edema involving the left side of the face. There is diffuse fluid in the scalp which may be subgaleal measuring up to 11 mm. IMPRESSION: 1. No acute intracranial abnormality. No skull fracture. 2. Stable atrophy and chronic small vessel ischemia. 3. Subcutaneous soft tissue edema involving the left side of the face. Diffuse fluid in the scalp, near circumferential, which may be subgaleal in location measuring up to 11 mm in thickness. This is of unknown etiology. Electronically Signed   By:  Keith Rake M.D.   On: 04/17/2020 21:54   CT CERVICAL SPINE WO CONTRAST  Result Date: 04/17/2020 CLINICAL DATA:  Neck trauma, blunt Patient reports generalized weakness. EXAM: CT CERVICAL SPINE WITHOUT CONTRAST TECHNIQUE: Multidetector CT imaging of the cervical spine was performed without intravenous contrast. Multiplanar CT image reconstructions were also generated. COMPARISON:  Cervical spine CT 01/12/2020 FINDINGS: Alignment: Broad-based rightward curvature, not seen on prior. Minor retrolisthesis of C4 on C5, C5 on C6 and C7 on T1, unchanged. Skull base and vertebrae: No acute fracture. No primary bone lesion or focal pathologic process. Soft tissues and spinal canal: No prevertebral fluid or swelling. No visible canal hematoma. Disc levels: Stable degree of diffuse degenerative disc disease and facet hypertrophy. Right neural foraminal stenosis at C4-C5 is unchanged. Upper chest: No acute or unexpected findings. Other: Carotid  calcifications. IMPRESSION: 1. No acute fracture or subluxation of the cervical spine. 2. Broad-based rightward curvature of the cervical spine is new from prior, may be positioning or related to muscle spasm. 3. Multilevel degenerative disc disease and facet hypertrophy is unchanged from prior. Electronically Signed   By: Keith Rake M.D.   On: 04/17/2020 21:48   MR BRAIN WO CONTRAST  Result Date: 04/20/2020 CLINICAL DATA:  Stroke, follow-up. Progressive bilateral lower extremity weakness. EXAM: MRI HEAD WITHOUT CONTRAST TECHNIQUE: Multiplanar, multiecho pulse sequences of the brain and surrounding structures were obtained without intravenous contrast. COMPARISON:  Head CT 04/17/2020 FINDINGS: Multiple sequences are moderately motion degraded. Brain: There is no evidence of acute infarct, intracranial hemorrhage, mass, midline shift, or extra-axial fluid collection. Patchy T2 hyperintensities in the cerebral white matter bilaterally are nonspecific but compatible with mild-to-moderate chronic small vessel ischemic disease. Mild cerebral atrophy is within normal limits for age. Vascular: Major intracranial vascular flow voids are preserved. Skull and upper cervical spine: No suspicious marrow lesion. Sinuses/Orbits: Unremarkable orbits. Trace mucosal thickening in the maxillary sinuses. Clear mastoid air cells. Other: None. IMPRESSION: 1. No acute intracranial abnormality. 2. Mild-to-moderate chronic small vessel ischemic disease. Electronically Signed   By: Logan Bores M.D.   On: 04/20/2020 16:59   MR CERVICAL SPINE WO CONTRAST  Result Date: 04/20/2020 CLINICAL DATA:  Ataxia, cervical spine trauma. Bilateral lower extremity weakness. EXAM: MRI CERVICAL SPINE WITHOUT CONTRAST TECHNIQUE: Multiplanar, multisequence MR imaging of the cervical spine was performed. No intravenous contrast was administered. COMPARISON:  Cervical spine CT 04/17/2020 FINDINGS: The study is moderately motion degraded.  Alignment: No significant listhesis is evident within limitations of motion artifact. Vertebrae: No fracture, suspicious osseous lesion, or significant marrow edema. Cord: Limited assessment due to motion. No gross cord signal abnormality. Posterior Fossa, vertebral arteries, paraspinal tissues: Unremarkable. Disc levels: C2-3: Negative. C3-4: Disc bulging and moderate left facet arthrosis result in mild spinal stenosis and borderline to mild left neural foraminal stenosis. C4-5: Broad-based posterior disc osteophyte complex and mild facet arthrosis result in moderate spinal stenosis with mild cord flattening and mild-to-moderate bilateral neural foraminal stenosis. C5-6: Broad-based posterior disc osteophyte complex, infolding of the ligamentum flavum, and mild facet arthrosis result in moderate spinal stenosis and mild right neural foraminal stenosis. C6-7: Mild disc bulging results in mild spinal stenosis without significant neural foraminal stenosis. C7-T1: Partially calcified central to right paracentral disc protrusion without evidence of significant stenosis. IMPRESSION: 1. Motion degraded examination. 2. Multilevel cervical disc degeneration with moderate spinal stenosis at C4-5 and C5-6. 3. Mild spinal stenosis at C3-4 and C6-7. 4. No gross spinal cord signal abnormality. Electronically Signed   By:  Logan Bores M.D.   On: 04/20/2020 16:53   MR THORACIC SPINE WO CONTRAST  Result Date: 04/20/2020 CLINICAL DATA:  Myelopathy, acute, progressive. EXAM: MRI THORACIC AND LUMBAR SPINE WITHOUT CONTRAST TECHNIQUE: Multiplanar and multiecho pulse sequences of the thoracic and lumbar spine were obtained without intravenous contrast. COMPARISON:  None. FINDINGS: Study partially degraded by motion. MRI THORACIC SPINE FINDINGS Alignment:  Physiologic. Vertebrae: No fracture, evidence of discitis, or bone lesion. Endplate degenerative changes are noted at T9-10. Cord:  Normal signal and morphology. Paraspinal and  other soft tissues: Edema in the paraspinal musculature on the left side in the lower thoracic and upper lumbar region. Disc levels: Small posterior disc protrusions and mild facet degenerative changes at T7-8, T8-9, T9-10 and T10-11 without significant spinal canal or neural foraminal stenosis. No significant disc bulge or herniation, spinal canal or neural foraminal stenosis and remaining thoracic levels. MRI LUMBAR SPINE FINDINGS Segmentation:  Standard. Alignment: Minimal anterolisthesis of L4 over L5 related to facet degeneration. Vertebrae: No fracture, evidence of discitis, or bone lesion. Hemangioma in the L1 vertebral body. Conus medullaris and cauda equina: Conus extends to the L1 level. Conus and cauda equina appear normal. Paraspinal and other soft tissues: Edema in the paraspinal musculature on the left side in the lower thoracic and upper lumbar region. 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 Disc levels: T12-L1: No spinal canal or neural foraminal stenosis. L1-2: Facet degenerative changes without significant spinal canal or neural foraminal stenosis. L2-3: Facet degenerative changes without significant spinal canal or neural foraminal stenosis. L3-4: Shallow disc bulge, facet degenerative changes and ligamentum flavum redundancy without significant spinal canal or neural foraminal stenosis. L4-5: Shallow disc bulge, prominent facet degenerative changes, left greater than right, and ligamentum flavum redundancy resulting in mild spinal canal stenosis. No significant neural foraminal narrowing. L5-S1: Prominent facet degenerative changes. No significant spinal canal or neural foraminal stenosis IMPRESSION: 1. No acute abnormality of the thoracic or lumbar spine. 2. Edema in the paraspinal musculature on the left side in the lower thoracic and upper lumbar region. This likely reflects a muscle strain. 3. Mild degenerative changes without significant spinal  canal or neural foraminal stenosis at any level. Electronically Signed   By: Pedro Earls M.D.   On: 04/20/2020 08:57   MR LUMBAR SPINE WO CONTRAST  Result Date: 04/20/2020 CLINICAL DATA:  Myelopathy, acute, progressive. EXAM: MRI THORACIC AND LUMBAR SPINE WITHOUT CONTRAST TECHNIQUE: Multiplanar and multiecho pulse sequences of the thoracic and lumbar spine were obtained without intravenous contrast. COMPARISON:  None. FINDINGS: Study partially degraded by motion. MRI THORACIC SPINE FINDINGS Alignment:  Physiologic. Vertebrae: No fracture, evidence of discitis, or bone lesion. Endplate degenerative changes are noted at T9-10. Cord:  Normal signal and morphology. Paraspinal and other soft tissues: Edema in the paraspinal musculature on the left side in the lower thoracic and upper lumbar region. Disc levels: Small posterior disc protrusions and mild facet degenerative changes at T7-8, T8-9, T9-10 and T10-11 without significant spinal canal or neural foraminal stenosis. No significant disc bulge or herniation, spinal canal or neural foraminal stenosis and remaining thoracic levels. MRI LUMBAR SPINE FINDINGS Segmentation:  Standard. Alignment: Minimal anterolisthesis of L4 over L5 related to facet degeneration. Vertebrae: No fracture, evidence of discitis, or bone lesion. Hemangioma in the L1 vertebral body. Conus medullaris and cauda equina: Conus extends to the L1 level. Conus and cauda equina appear normal. Paraspinal and other soft tissues: Edema in  the paraspinal musculature on the left side in the lower thoracic and upper lumbar region. 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 Disc levels: T12-L1: No spinal canal or neural foraminal stenosis. L1-2: Facet degenerative changes without significant spinal canal or neural foraminal stenosis. L2-3: Facet degenerative changes without significant spinal canal or neural foraminal stenosis. L3-4: Shallow disc  bulge, facet degenerative changes and ligamentum flavum redundancy without significant spinal canal or neural foraminal stenosis. L4-5: Shallow disc bulge, prominent facet degenerative changes, left greater than right, and ligamentum flavum redundancy resulting in mild spinal canal stenosis. No significant neural foraminal narrowing. L5-S1: Prominent facet degenerative changes. No significant spinal canal or neural foraminal stenosis IMPRESSION: 1. No acute abnormality of the thoracic or lumbar spine. 2. Edema in the paraspinal musculature on the left side in the lower thoracic and upper lumbar region. This likely reflects a muscle strain. 3. Mild degenerative changes without significant spinal canal or neural foraminal stenosis at any level. Electronically Signed   By: Pedro Earls M.D.   On: 04/20/2020 08:57   DG Chest Port 1 View  Result Date: 04/17/2020 CLINICAL DATA:  Increased weakness x1 day. EXAM: PORTABLE CHEST 1 VIEW COMPARISON:  None. FINDINGS: There is no evidence of acute infiltrate, pleural effusion or pneumothorax. The heart size and mediastinal contours are within normal limits. There is mild calcification of the aortic arch. Multilevel degenerative changes seen throughout the thoracic spine. IMPRESSION: No active disease. Electronically Signed   By: Virgina Norfolk M.D.   On: 04/17/2020 20:44   DG Foot 2 Views Left  Result Date: 04/20/2020 CLINICAL DATA:  Toe pain pain at third digit EXAM: LEFT FOOT - 2 VIEW COMPARISON:  09/10/2018 FINDINGS: No acute displaced fracture or malalignment. Chronic distal fifth toe deformity with prior partial resection of the metatarsal. No osseous erosive change or bony destruction. IMPRESSION: No acute osseous abnormality. Chronic appearing deformity involving the fifth digit distally. Electronically Signed   By: Donavan Foil M.D.   On: 04/20/2020 15:30   ECHOCARDIOGRAM COMPLETE  Result Date: 04/18/2020    ECHOCARDIOGRAM REPORT    Patient Name:   Diane Snow Date of Exam: 04/18/2020 Medical Rec #:  [id number]        Height:       58.0 in Accession #:    [id number]       Weight:       130.0 lb Date of Birth:  19-Oct-1946         BSA:          1.516 m Patient Age:    56 years         BP:           125/91 mmHg Patient Gender: F                HR:           78 bpm. Exam Location:  Inpatient Procedure: 2D Echo, Cardiac Doppler and Color Doppler Indications:    Leg Swelling [id number]; Elevated Brain Natriuretic Peptide (BNP)                 Level 009233  History:        Patient has no prior history of Echocardiogram examinations.                 Risk Factors:Hypertension.  Sonographer:    Jonelle Sidle Dance Referring Phys: [id number] [place]  1.  Hyperdynamic LV systolic function; moderate LVH; intracavitary gradient of 2.3 m/s likely due to hyperdynamic function; moderate LVH; grade 1 diastolic dysfunction.  2. Left ventricular ejection fraction, by estimation, is >75%. The left ventricle has hyperdynamic function. The left ventricle has no regional wall motion abnormalities. There is moderate left ventricular hypertrophy. Left ventricular diastolic parameters are consistent with Grade I diastolic dysfunction (impaired relaxation).  3. Right ventricular systolic function is normal. The right ventricular size is normal.  4. The mitral valve is normal in structure. No evidence of mitral valve regurgitation. No evidence of mitral stenosis.  5. The aortic valve is tricuspid. Aortic valve regurgitation is not visualized. Mild aortic valve sclerosis is present, with no evidence of aortic valve stenosis.  6. The inferior vena cava is normal in size with greater than 50% respiratory variability, suggesting right atrial pressure of 3 mmHg. FINDINGS  Left Ventricle: Left ventricular ejection fraction, by estimation, is >75%. The left ventricle has hyperdynamic function. The left ventricle has no regional wall motion abnormalities. The left  ventricular internal cavity size was normal in size. There is moderate left ventricular hypertrophy. Left ventricular diastolic parameters are consistent with Grade I diastolic dysfunction (impaired relaxation). Right Ventricle: The right ventricular size is normal. Right ventricular systolic function is normal. Left Atrium: Left atrial size was normal in size. Right Atrium: Right atrial size was normal in size. Pericardium: There is no evidence of pericardial effusion. Mitral Valve: The mitral valve is normal in structure. Normal mobility of the mitral valve leaflets. No evidence of mitral valve regurgitation. No evidence of mitral valve stenosis. Tricuspid Valve: The tricuspid valve is normal in structure. Tricuspid valve regurgitation is trivial. No evidence of tricuspid stenosis. Aortic Valve: The aortic valve is tricuspid. Aortic valve regurgitation is not visualized. Mild aortic valve sclerosis is present, with no evidence of aortic valve stenosis. Pulmonic Valve: The pulmonic valve was normal in structure. Pulmonic valve regurgitation is not visualized. No evidence of pulmonic stenosis. Aorta: The aortic root is normal in size and structure. Venous: The inferior vena cava is normal in size with greater than 50% respiratory variability, suggesting right atrial pressure of 3 mmHg. IAS/Shunts: No atrial level shunt detected by color flow Doppler. Additional Comments: Hyperdynamic LV systolic function; moderate LVH; intracavitary gradient of 2.3 m/s likely due to hyperdynamic function; moderate LVH; grade 1 diastolic dysfunction.  LEFT VENTRICLE PLAX 2D LVIDd:         3.30 cm  Diastology LVIDs:         1.80 cm  LV e' lateral:   5.66 cm/s LV PW:         1.00 cm  LV E/e' lateral: 9.7 LV IVS:        1.00 cm  LV e' medial:    4.35 cm/s LVOT diam:     1.70 cm  LV E/e' medial:  12.6 LV SV:         42 LV SV Index:   27 LVOT Area:     2.27 cm  RIGHT VENTRICLE             IVC RV Basal diam:  2.50 cm     IVC diam: 1.40 cm  RV S prime:     10.70 cm/s TAPSE (M-mode): 1.9 cm LEFT ATRIUM             Index       RIGHT ATRIUM           Index LA diam:  2.50 cm 1.65 cm/m  RA Area:     11.10 cm LA Vol (A2C):   27.1 ml 17.87 ml/m RA Volume:   22.50 ml  14.84 ml/m LA Vol (A4C):   36.4 ml 24.00 ml/m LA Biplane Vol: 33.5 ml 22.09 ml/m  AORTIC VALVE LVOT Vmax:   114.00 cm/s LVOT Vmean:  67.800 cm/s LVOT VTI:    0.183 m  AORTA Ao Root diam: 2.70 cm Ao Asc diam:  3.40 cm MITRAL VALVE MV Area (PHT): 2.24 cm    SHUNTS MV Decel Time: 338 msec    Systemic VTI:  0.18 m MV E velocity: 54.80 cm/s  Systemic Diam: 1.70 cm MV A velocity: 84.60 cm/s MV E/A ratio:  0.65 Kirk Ruths MD Electronically signed by Kirk Ruths MD Signature Date/Time: 04/18/2020/2:17:09 PM    Final    VAS [place] LOWER EXTREMITY VENOUS (DVT)  Result Date: 04/23/2020  Lower Venous DVTStudy Indications: Swelling.  Risk Factors: None identified. Limitations: Poor ultrasound/tissue interface. Comparison Study: No prior studies. Performing Technologist: Oliver Hum RVT  Examination Guidelines: A complete evaluation includes B-mode imaging, spectral Doppler, color Doppler, and power Doppler as needed of all accessible portions of each vessel. Bilateral testing is considered an integral part of a complete examination. Limited examinations for reoccurring indications may be performed as noted. The reflux portion of the exam is performed with the patient in reverse Trendelenburg.  +---------+---------------+---------+-----------+----------+--------------+ RIGHT    CompressibilityPhasicitySpontaneityPropertiesThrombus Aging +---------+---------------+---------+-----------+----------+--------------+ CFV      Full           Yes      Yes                                 +---------+---------------+---------+-----------+----------+--------------+ SFJ      Full                                                         +---------+---------------+---------+-----------+----------+--------------+ FV Prox  Full                                                        +---------+---------------+---------+-----------+----------+--------------+ FV Mid   Full                                                        +---------+---------------+---------+-----------+----------+--------------+ FV DistalFull                                                        +---------+---------------+---------+-----------+----------+--------------+ PFV      Full                                                        +---------+---------------+---------+-----------+----------+--------------+  POP      Full           Yes      Yes                                 +---------+---------------+---------+-----------+----------+--------------+ PTV      Full                                                        +---------+---------------+---------+-----------+----------+--------------+ PERO     Full                                                        +---------+---------------+---------+-----------+----------+--------------+   +----+---------------+---------+-----------+----------+--------------+ LEFTCompressibilityPhasicitySpontaneityPropertiesThrombus Aging +----+---------------+---------+-----------+----------+--------------+ CFV Full           Yes      Yes                                 +----+---------------+---------+-----------+----------+--------------+     Summary: RIGHT: - There is no evidence of deep vein thrombosis in the lower extremity.  - No cystic structure found in the popliteal fossa.  LEFT: - No evidence of common femoral vein obstruction.  *See table(s) above for measurements and observations. Electronically signed by Deitra Mayo MD on 04/23/2020 at 6:25:09 AM.    Final    [place] EKG SITE RITE  Result Date: 04/18/2020 If Site Rite image not attached, placement could not be  confirmed due to current cardiac rhythm.     TRANSTHORACIC ECHOCARDIOGRAM (04/18/2020) IMPRESSIONS    1. Hyperdynamic LV systolic function; moderate LVH; intracavitary  gradient of 2.3 m/s likely due to hyperdynamic function; moderate LVH;  grade 1 diastolic dysfunction.  2. Left ventricular ejection fraction, by estimation, is >75%. The left  ventricle has hyperdynamic function. The left ventricle has no regional  wall motion abnormalities. There is moderate left ventricular hypertrophy.  Left ventricular diastolic parameters are consistent with Grade I diastolic dysfunction (impaired  relaxation).  3. Right ventricular systolic function is normal. The right ventricular  size is normal.  4. The mitral valve is normal in structure. No evidence of mitral valve  regurgitation. No evidence of mitral stenosis.  5. The aortic valve is tricuspid. Aortic valve regurgitation is not  visualized. Mild aortic valve sclerosis is present, with no evidence of  aortic valve stenosis.  6. The inferior vena cava is normal in size with greater than 50%  respiratory variability, suggesting right atrial pressure of 3 mmHg.    Subjective: No issues overnight. Still with LE weakness.  Discharge Exam: Vitals:   04/22/20 2017 04/23/20 0507  BP: (!) 146/71 (!) 146/62  Pulse: 77 65  Resp: 17 14  Temp: 97.8 F (36.6 C) 98.3 F (36.8 C)  SpO2: 97% 98%   Vitals:   04/22/20 1409 04/22/20 2017 04/23/20 0500 04/23/20 0507  BP: 140/84 (!) 146/71  (!) 146/62  Pulse: 92 77  65  Resp: 16 17  14   Temp: 98.6 F (37 C) 97.8 F (36.6 C)  98.3 F (36.8 C)  TempSrc: Oral Oral  Oral  SpO2: 97% 97%  98%  Weight:   70.1 kg   Height:        General: Pt is alert, awake, not in acute distress Cardiovascular: RRR, S1/S2 +, no rubs, no gallops Respiratory: CTA bilaterally, no wheezing, no rhonchi Abdominal: Soft, NT, ND, bowel sounds + Extremities: bilateral LE edema, no cyanosis    The results  of significant diagnostics from this hospitalization (including imaging, microbiology, ancillary and laboratory) are listed below for reference.     Microbiology: Recent Results (from the past 240 hour(s))  Urine culture     Status: None   Collection Time: 04/18/20 12:07 AM   Specimen: Urine, Random  Result Value Ref Range Status   Specimen Description   Final    URINE, RANDOM Performed at [place] [address] [place], [place] [address]    Special Requests   Final    NONE Performed at [facility], [place] [address] [place], [place] [address]    Culture   Final    NO GROWTH Performed at [facility], [place] [address] [place], [place] [address]    Report Status 04/19/2020 FINAL  Final  SARS Coronavirus 2 by RT PCR (hospital order, performed in [facility] hospital lab) Nasopharyngeal Nasopharyngeal Swab     Status: None   Collection Time: 04/18/20  2:05 AM   Specimen: Nasopharyngeal Swab  Result Value Ref Range Status   SARS Coronavirus 2 NEGATIVE NEGATIVE Final    Comment: (NOTE) SARS-CoV-2 target nucleic acids are NOT DETECTED.  The SARS-CoV-2 RNA is generally detectable in upper and lower respiratory specimens during the acute phase of infection. The lowest concentration of SARS-CoV-2 viral copies this assay can detect is 250 copies / mL. A negative result does not preclude SARS-CoV-2 infection and should not be used as the sole basis for treatment or other patient management decisions.  A negative result may occur with improper specimen collection / handling, submission of specimen other than nasopharyngeal swab, presence of viral mutation(s) within the areas targeted by this assay, and inadequate number of viral copies (<250 copies / mL). A negative result must be combined with clinical observations, patient history, and epidemiological information.  Fact Sheet for Patients:    StrictlyIdeas.no  Fact Sheet for Healthcare Providers: BankingDealers.co.za  This test is not yet approved or  cleared by the [place] FDA and has been authorized for detection and/or diagnosis of SARS-CoV-2 by FDA under an Emergency Use Authorization (EUA).  This EUA will remain in effect (meaning this test can be used) for the duration of the COVID-19 declaration under Section 564(b)(1) of the Act, 21 U.S.C. section 360bbb-3(b)(1), unless the authorization is terminated or revoked sooner.  Performed at [facility], [place] [address], [place], [place] [address]   SARS CORONAVIRUS 2 (TAT 6-24 HRS) Nasopharyngeal Nasopharyngeal Swab     Status: None   Collection Time: 04/22/20  5:54 PM   Specimen: Nasopharyngeal Swab  Result Value Ref Range Status   SARS Coronavirus 2 NEGATIVE NEGATIVE Final    Comment: (NOTE) SARS-CoV-2 target nucleic acids are NOT DETECTED.  The SARS-CoV-2 RNA is generally detectable in upper and lower respiratory specimens during the acute phase of infection. Negative results do not preclude SARS-CoV-2 infection, do not rule out co-infections with other pathogens, and should not be used as the sole basis for treatment or other patient management decisions. Negative results must be combined with clinical  observations, patient history, and epidemiological information. The expected result is Negative.  Fact Sheet for Patients: SugarRoll.be  Fact Sheet for Healthcare Providers: https://www.woods-mathews.com/  This test is not yet approved or cleared by the [place] FDA and  has been authorized for detection and/or diagnosis of SARS-CoV-2 by FDA under an Emergency Use Authorization (EUA). This EUA will remain  in effect (meaning this test can be used) for the duration of the COVID-19 declaration under Se ction 564(b)(1) of the Act, 21  U.S.C. section 360bbb-3(b)(1), unless the authorization is terminated or revoked sooner.  Performed at [facility], [place] [address], [place],  [address]      Labs: BNP (last 3 results) Recent Labs    04/17/20 2025  BNP 527.7*   Basic Metabolic Panel: Recent Labs  Lab 04/18/20 1301 04/19/20 0346 04/20/20 0752 04/21/20 0823 04/22/20 0751 04/23/20 0554  NA 142 141 139 139 138  --   K 3.5 3.6 3.3* 3.3* 3.2*  --   CL 108 111 107 105 102  --   CO2 21* 19* 25 23 29   --   GLUCOSE 167* 103* 92 92 100*  --   BUN 42* 51* 43* 23 15  --   CREATININE 1.90* 2.17* 1.25* 0.68 0.61  --   CALCIUM 8.5* 8.2* 7.6* 7.7* 7.9*  --   MG  --   --   --   --   --  1.8   Liver Function Tests: Recent Labs  Lab 04/17/20 2020 04/22/20 0751  AST 156* 65*  ALT 35 34  ALKPHOS 70 42  BILITOT 0.9 0.8  PROT 8.6* 5.3*  ALBUMIN 3.9 2.4*   No results for input(s): LIPASE, AMYLASE in the last 168 hours. No results for input(s): AMMONIA in the last 168 hours. CBC: Recent Labs  Lab 04/17/20 2020 04/21/20 0823 04/22/20 0531  WBC 10.6* 6.3 6.1  HGB 16.5* 10.6* 10.4*  HCT 50.6* 32.4* 31.7*  MCV 90.8 90.5 89.5  PLT 203 129* 146*   Cardiac Enzymes: Recent Labs  Lab 04/18/20 0919 04/19/20 0346 04/21/20 0827 04/22/20 0751 04/23/20 0554  CKTOTAL 18,250* 1,243* 2,159* 1,249* 862*   BNP: Invalid input(s): POCBNP CBG: No results for input(s): GLUCAP in the last 168 hours. D-Dimer No results for input(s): DDIMER in the last 72 hours. Hgb A1c No results for input(s): HGBA1C in the last 72 hours. Lipid Profile No results for input(s): CHOL, HDL, LDLCALC, TRIG, CHOLHDL, LDLDIRECT in the last 72 hours. Thyroid function studies No results for input(s): TSH, T4TOTAL, T3FREE, THYROIDAB in the last 72 hours.  Invalid input(s): FREET3 Anemia work up No results for input(s): VITAMINB12, FOLATE, FERRITIN, TIBC, IRON, RETICCTPCT in the last 72 hours. Urinalysis    Component Value  Date/Time   COLORURINE AMBER (A) 04/17/2020 0007   APPEARANCEUR CLOUDY (A) 04/17/2020 0007   LABSPEC 1.018 04/17/2020 0007   PHURINE 6.0 04/17/2020 0007   GLUCOSEU 50 (A) 04/17/2020 0007   HGBUR LARGE (A) 04/17/2020 0007   BILIRUBINUR NEGATIVE 04/17/2020 0007   KETONESUR 20 (A) 04/17/2020 0007   PROTEINUR >=300 (A) 04/17/2020 0007   NITRITE NEGATIVE 04/17/2020 0007   LEUKOCYTESUR NEGATIVE 04/17/2020 0007   Sepsis Labs Invalid input(s): PROCALCITONIN,  WBC,  LACTICIDVEN Microbiology Recent Results (from the past 240 hour(s))  Urine culture     Status: None   Collection Time: 04/18/20 12:07 AM   Specimen: Urine, Random  Result Value Ref Range Status   Specimen Description   Final  URINE, RANDOM Performed at [facility], [place] [address] [place], [place] [address]    Special Requests   Final    NONE Performed at [facility], [place] [address] [place], [place] [address]    Culture   Final    NO GROWTH Performed at [facility], [place] [address] [place], [place] [address]    Report Status 04/19/2020 FINAL  Final  SARS Coronavirus 2 by RT PCR (hospital order, performed in [facility] hospital lab) Nasopharyngeal Nasopharyngeal Swab     Status: None   Collection Time: 04/18/20  2:05 AM   Specimen: Nasopharyngeal Swab  Result Value Ref Range Status   SARS Coronavirus 2 NEGATIVE NEGATIVE Final    Comment: (NOTE) SARS-CoV-2 target nucleic acids are NOT DETECTED.  The SARS-CoV-2 RNA is generally detectable in upper and lower respiratory specimens during the acute phase of infection. The lowest concentration of SARS-CoV-2 viral copies this assay can detect is 250 copies / mL. A negative result does not preclude SARS-CoV-2 infection and should not be used as the sole basis for treatment or other patient management decisions.  A negative result may occur with improper specimen collection / handling, submission of specimen other than  nasopharyngeal swab, presence of viral mutation(s) within the areas targeted by this assay, and inadequate number of viral copies (<250 copies / mL). A negative result must be combined with clinical observations, patient history, and epidemiological information.  Fact Sheet for Patients:   StrictlyIdeas.no  Fact Sheet for Healthcare Providers: BankingDealers.co.za  This test is not yet approved or  cleared by the [place] FDA and has been authorized for detection and/or diagnosis of SARS-CoV-2 by FDA under an Emergency Use Authorization (EUA).  This EUA will remain in effect (meaning this test can be used) for the duration of the COVID-19 declaration under Section 564(b)(1) of the Act, 21 U.S.C. section 360bbb-3(b)(1), unless the authorization is terminated or revoked sooner.  Performed at [facility], [place] [address], [place], [place] [address]   SARS CORONAVIRUS 2 (TAT 6-24 HRS) Nasopharyngeal Nasopharyngeal Swab     Status: None   Collection Time: 04/22/20  5:54 PM   Specimen: Nasopharyngeal Swab  Result Value Ref Range Status   SARS Coronavirus 2 NEGATIVE NEGATIVE Final    Comment: (NOTE) SARS-CoV-2 target nucleic acids are NOT DETECTED.  The SARS-CoV-2 RNA is generally detectable in upper and lower respiratory specimens during the acute phase of infection. Negative results do not preclude SARS-CoV-2 infection, do not rule out co-infections with other pathogens, and should not be used as the sole basis for treatment or other patient management decisions. Negative results must be combined with clinical observations, patient history, and epidemiological information. The expected result is Negative.  Fact Sheet for Patients: SugarRoll.be  Fact Sheet for Healthcare Providers: https://www.woods-mathews.com/  This test is not yet approved or cleared by the [place] FDA and  has been authorized for detection and/or diagnosis of SARS-CoV-2 by FDA under an Emergency Use Authorization (EUA). This EUA will remain  in effect (meaning this test can be used) for the duration of the COVID-19 declaration under Se ction 564(b)(1) of the Act, 21 U.S.C. section 360bbb-3(b)(1), unless the authorization is terminated or revoked sooner.  Performed at [facility], [place] [address] [place], [place] [address]      Time coordinating discharge: 35 minutes  SIGNED:   Cordelia Poche, MD [facility] 04/23/2020, 11:41 AM

## 2020-04-23 NOTE — Progress Notes (Signed)
Report called to Nunzio Cory at [facility].

## 2020-04-23 NOTE — TOC Transition Note (Signed)
Transition of Care [facility]) - CM/SW Discharge Note   Patient Details  Name: Tyjae Shvartsman MRN: [id number] Date of Birth: 04/15/1946  Transition of Care [facility] CM/SW Contact:  Lennart Pall, LCSW Phone Number: 04/23/2020, 10:53 AM   Clinical Narrative:   Have received insurance auth and pt medically cleared for transfer to [facility] today.   [facility] to transport.  No further TOC needs.    Final next level of care: Skilled Nursing Facility Barriers to Discharge: Barriers Resolved   Patient Goals and CMS Choice Patient states their goals for this hospitalization and ongoing recovery are:: Pt would hope to be able to return home, however, realistic that rehab is needed at this time. CMS Medicare.gov Compare Post Acute Care list provided to:: Patient Choice offered to / list presented to : Patient  Discharge Placement              Patient chooses bed at: [facility] Patient to be transferred to facility by: [facility] Name of family member notified: cousin, Mardene Celeste Patient and family notified of of transfer: 04/23/20  Discharge Plan and Services In-house Referral: Clinical Social Work   Post Acute Care Choice: [place], [facility] Rehab                               Social Determinants of Health (SDOH) Interventions     Readmission Risk Interventions No flowsheet data found.

## 2020-04-25 DIAGNOSIS — L8992 Pressure ulcer of unspecified site, stage 2: Secondary | ICD-10-CM | POA: Diagnosis not present

## 2020-04-25 DIAGNOSIS — M6282 Rhabdomyolysis: Secondary | ICD-10-CM | POA: Diagnosis not present

## 2020-04-25 DIAGNOSIS — R2689 Other abnormalities of gait and mobility: Secondary | ICD-10-CM | POA: Diagnosis not present

## 2020-04-25 DIAGNOSIS — M6281 Muscle weakness (generalized): Secondary | ICD-10-CM | POA: Diagnosis not present

## 2020-04-25 DIAGNOSIS — R8281 Pyuria: Secondary | ICD-10-CM | POA: Diagnosis not present

## 2020-04-25 DIAGNOSIS — I10 Essential (primary) hypertension: Secondary | ICD-10-CM | POA: Diagnosis not present

## 2020-04-25 DIAGNOSIS — E46 Unspecified protein-calorie malnutrition: Secondary | ICD-10-CM | POA: Diagnosis not present

## 2020-04-25 DIAGNOSIS — R609 Edema, unspecified: Secondary | ICD-10-CM | POA: Diagnosis not present

## 2020-04-25 DIAGNOSIS — N179 Acute kidney failure, unspecified: Secondary | ICD-10-CM | POA: Diagnosis not present

## 2020-04-27 DIAGNOSIS — E46 Unspecified protein-calorie malnutrition: Secondary | ICD-10-CM | POA: Diagnosis not present

## 2020-04-27 DIAGNOSIS — L89159 Pressure ulcer of sacral region, unspecified stage: Secondary | ICD-10-CM | POA: Diagnosis not present

## 2020-04-27 DIAGNOSIS — R29898 Other symptoms and signs involving the musculoskeletal system: Secondary | ICD-10-CM | POA: Diagnosis not present

## 2020-04-27 DIAGNOSIS — M48061 Spinal stenosis, lumbar region without neurogenic claudication: Secondary | ICD-10-CM | POA: Diagnosis not present

## 2020-04-29 DIAGNOSIS — L89159 Pressure ulcer of sacral region, unspecified stage: Secondary | ICD-10-CM | POA: Diagnosis not present

## 2020-04-29 DIAGNOSIS — L02818 Cutaneous abscess of other sites: Secondary | ICD-10-CM | POA: Diagnosis not present

## 2020-04-29 DIAGNOSIS — E46 Unspecified protein-calorie malnutrition: Secondary | ICD-10-CM | POA: Diagnosis not present

## 2020-05-03 ENCOUNTER — Other Ambulatory Visit: Payer: Medicare HMO | Admitting: Clinical

## 2020-05-03 DIAGNOSIS — S31829A Unspecified open wound of left buttock, initial encounter: Secondary | ICD-10-CM | POA: Diagnosis not present

## 2020-05-03 DIAGNOSIS — L8946 Pressure-induced deep tissue damage of contiguous site of back, buttock and hip: Secondary | ICD-10-CM | POA: Diagnosis not present

## 2020-05-03 DIAGNOSIS — S21209A Unspecified open wound of unspecified back wall of thorax without penetration into thoracic cavity, initial encounter: Secondary | ICD-10-CM | POA: Diagnosis not present

## 2020-05-05 DIAGNOSIS — M6282 Rhabdomyolysis: Secondary | ICD-10-CM | POA: Diagnosis not present

## 2020-05-05 DIAGNOSIS — E46 Unspecified protein-calorie malnutrition: Secondary | ICD-10-CM | POA: Diagnosis not present

## 2020-05-05 DIAGNOSIS — R2689 Other abnormalities of gait and mobility: Secondary | ICD-10-CM | POA: Diagnosis not present

## 2020-05-05 DIAGNOSIS — L8992 Pressure ulcer of unspecified site, stage 2: Secondary | ICD-10-CM | POA: Diagnosis not present

## 2020-05-05 DIAGNOSIS — I10 Essential (primary) hypertension: Secondary | ICD-10-CM | POA: Diagnosis not present

## 2020-05-10 DIAGNOSIS — S21209D Unspecified open wound of unspecified back wall of thorax without penetration into thoracic cavity, subsequent encounter: Secondary | ICD-10-CM | POA: Diagnosis not present

## 2020-05-10 DIAGNOSIS — S31829D Unspecified open wound of left buttock, subsequent encounter: Secondary | ICD-10-CM | POA: Diagnosis not present

## 2020-05-11 DIAGNOSIS — M6282 Rhabdomyolysis: Secondary | ICD-10-CM | POA: Diagnosis not present

## 2020-05-11 DIAGNOSIS — L8992 Pressure ulcer of unspecified site, stage 2: Secondary | ICD-10-CM | POA: Diagnosis not present

## 2020-05-11 DIAGNOSIS — I10 Essential (primary) hypertension: Secondary | ICD-10-CM | POA: Diagnosis not present

## 2020-05-11 DIAGNOSIS — N179 Acute kidney failure, unspecified: Secondary | ICD-10-CM | POA: Diagnosis not present

## 2020-05-11 DIAGNOSIS — E46 Unspecified protein-calorie malnutrition: Secondary | ICD-10-CM | POA: Diagnosis not present

## 2020-05-13 DIAGNOSIS — E46 Unspecified protein-calorie malnutrition: Secondary | ICD-10-CM | POA: Diagnosis not present

## 2020-05-13 DIAGNOSIS — R4189 Other symptoms and signs involving cognitive functions and awareness: Secondary | ICD-10-CM | POA: Diagnosis not present

## 2020-05-13 DIAGNOSIS — I10 Essential (primary) hypertension: Secondary | ICD-10-CM | POA: Diagnosis not present

## 2020-05-13 DIAGNOSIS — L89159 Pressure ulcer of sacral region, unspecified stage: Secondary | ICD-10-CM | POA: Diagnosis not present

## 2020-05-19 ENCOUNTER — Ambulatory Visit: Payer: Medicare HMO | Admitting: Neurology

## 2020-05-19 ENCOUNTER — Encounter: Payer: Self-pay | Admitting: Neurology

## 2020-05-19 ENCOUNTER — Other Ambulatory Visit: Payer: Self-pay

## 2020-05-19 VITALS — BP 152/80 | HR 58

## 2020-05-19 DIAGNOSIS — R269 Unspecified abnormalities of gait and mobility: Secondary | ICD-10-CM

## 2020-05-19 DIAGNOSIS — R531 Weakness: Secondary | ICD-10-CM | POA: Insufficient documentation

## 2020-05-19 NOTE — Progress Notes (Signed)
HISTORICAL  Diane Snow is a 74 year old female, seen in request by her primary care physician Mack Hook, for evaluation of weakness, gait abnormality, she is accompanied by her cousin Diane Snow at today's visit on May 19, 2020.  I reviewed and summarized the referring note.  Past medical history Hypertension  She retired at [employer] at age 40, lives alone, her cousin Diane Snow has interaction with her seeing her regularly multiple times in a week, patient has been active all her life, actively involved in her community and church service,  Since summer 2020, she noticed gradual onset left-sided weakness, involving left arm, left leg, but denies pain, denies sensory loss, she began to cane, but still have independent life, driving, cooking, cleaning home, and visiting her community members and church member regularly, she contributed her left side weakness, gait abnormality to her left knee pain, which she receiving therapy, and the knee joint injection regularly.  She denies bowel and bladder incontinence,  She talked with her cousin Diane Snow multiple times during the week, last time she was at her baseline was on April 14, 2020, she was talking clear minded, but noticed to be lethargic, on June 20, Sunday, she was not answering her phone, on Diane Snow check on her on Sunday evening 5 PM, she was found slumped in her chair, hyperextending of her neck, confused, talking slowly, look like she has been in that position for a while, paramedic was called,  I reviewed hospital admission record from June 20 to 26 2021, most striking abnormality has significantly elevated CPK level up to 18 K, which was quickly normalized to 862 upon discharge needs aggressive hydration, there was also of acute renal failure creatinine up to 1.33, normalized to baseline 0.89, mild elevated AST of 156, BNP was elevated 271,  There was also noticeable left buttock region stage II pressure ulcer, partial  thickness loss of dermis present as shallow open wound with a red-pink wound bed, 1 large with 2 surrounding tears, there was also pressure ulcer along vertical:, Purple area of localized discolored intact skin, blood-filled blister due to damage of underlying soft tissue from pressure,  She was diagnosed with rhabdomyolysis, pressure injury of the skin,  She was also seen by neuro hospitalist, I personally reviewed MRI of cervical spine: Multilevel degenerative changes, congenital narrowing of canal, moderate multilevel spinal stenosis at C4-5, C5-6, no cord signal abnormality  MRI of the brain showed mild to moderate supratentorium small vessel disease, no acute abnormality  MRI of lumbar, edema in the paraspinal musculature on the left side mid and lower thoracic, and upper lumbar region, likely reflecting muscle strain, multilevel degenerative changes, no significant canal or foraminal stenosis  MRI of thoracic spine no significant canal stenosis   There was a concern of parkinsonian features, have suggested Sinemet 25/100 mg 3 times a day, without improvement,  Since the hospital admission, she is no longer ambulatory, she is currently at [facility] rehabilitation center, is concerned about with decreased functional ability, potential long-term placement issues.  Echocardiogram showed mild left ventricular hypertrophy, grade 1 diastolic dysfunction,  Laboratory evaluations in June 2021: Magnesium was normal 1.8, CPK was 18250, on April 18, 2020, 2159, 1249, 862, C-reactive protein 3.1, ESR 43, urine myoglobin was 658 elevated, vitamin B12 1309, normal TSH   REVIEW OF SYSTEMS: Full 14 system review of systems performed and notable only for as above All other review of systems were negative.  ALLERGIES: No Active Allergies  HOME MEDICATIONS: Current Outpatient Medications  Medication Sig Dispense Refill  . aspirin 81 MG tablet Take 81 mg by mouth daily.    . diclofenac sodium  (VOLTAREN) 1 % GEL Apply 4 g topically 4 (four) times daily. 1 Tube 2  . feeding supplement, ENSURE ENLIVE, (ENSURE ENLIVE) LIQD Take 237 mLs by mouth 2 (two) times daily between meals.    . furosemide (LASIX) 20 MG tablet Take 20 mg by mouth daily.    . metoprolol succinate (TOPROL-XL) 50 MG 24 hr tablet Take 1 tablet (50 mg total) by mouth daily. Take with or immediately following a meal. 30 tablet 11  . multivitamin (PROSIGHT) TABS tablet Take 1 tablet by mouth daily.    . potassium chloride (K-DUR,KLOR-CON) 10 MEQ tablet Take 10 mEq by mouth daily.  4  . thiamine 100 MG tablet Take 1 tablet (100 mg total) by mouth daily.    . vitamin B-12 (CYANOCOBALAMIN) 1000 MCG tablet Take 1 tablet (1,000 mcg total) by mouth daily.     No current facility-administered medications for this visit.    PAST MEDICAL HISTORY: Past Medical History:  Diagnosis Date  . Abnormal gait   . Allergy   . Arthritis   . Hx of adenomatous colonic polyps 09/27/2014  . Hypertension   . Osteoarthritis   . Rhabdomyolysis   . Weakness     PAST SURGICAL HISTORY: Past Surgical History:  Procedure Laterality Date  . BREAST BIOPSY Left 1976  . BREAST EXCISIONAL BIOPSY Left   . COLONOSCOPY    . FOOT SURGERY Left   . PARTIAL HYSTERECTOMY     fibroid tumors    FAMILY HISTORY: Family History  Problem Relation Age of Onset  . Kidney disease Mother        cause of death  . Hypertension Mother   . Stroke Mother   . Heart disease Father        CHF cause of death  . Hypertension Father   . Pneumonia Father   . Breast cancer Maternal Aunt   . Heart disease Brother        CHF cause of death  . Gout Brother   . Colon cancer Neg Hx   . Rectal cancer Neg Hx   . Stomach cancer Neg Hx   . Esophageal cancer Neg Hx   . Pancreatic cancer Neg Hx     SOCIAL HISTORY: Social History   Socioeconomic History  . Marital status: Single    Spouse name: Not on file  . Number of children: 0  . Years of education: 25    . Highest education level: High school graduate  Occupational History  . Occupation: Retired  Tobacco Use  . Smoking status: Former Smoker    Packs/day: 1.00    Years: 20.00    Pack years: 20.00    Types: Cigarettes    Quit date: 03/30/2011    Years since quitting: 9.1  . Smokeless tobacco: Never Used  Vaping Use  . Vaping Use: Never used  Substance and Sexual Activity  . Alcohol use: Yes    Alcohol/week: 0.0 standard drinks    Comment: may drink one beer per day  . Drug use: No  . Sexual activity: Not on file  Other Topics Concern  . Not on file  Social History Narrative   Normally, she lives alone. She is currently at [facility] for rehab and PT.   Right-handed.   One cup caffeine per day.   Social Determinants of Radio broadcast assistant  Strain: Low Risk   . Difficulty of Paying Living Expenses: Not hard at all  Food Insecurity: No Food Insecurity  . Worried About Charity fundraiser in the Last Year: Never true  . Ran Out of Food in the Last Year: Never true  Transportation Needs: No Transportation Needs  . Lack of Transportation (Medical): No  . Lack of Transportation (Non-Medical): No  Physical Activity:   . Days of Exercise per Week:   . Minutes of Exercise per Session:   Stress: No Stress Concern Present  . Feeling of Stress : Not at all  Social Connections: Unknown  . Frequency of Communication with Friends and Family: More than three times a week  . Frequency of Social Gatherings with Friends and Family: Twice a week  . Attends Religious Services: More than 4 times per year  . Active Member of Clubs or Organizations: Yes  . Attends Archivist Meetings: More than 4 times per year  . Marital Status: Not on file  Intimate Partner Violence: Not At Risk  . Fear of Current or Ex-Partner: No  . Emotionally Abused: No  . Physically Abused: No  . Sexually Abused: No     PHYSICAL EXAM   Vitals:   05/19/20 0816  BP: (!) 152/80  Pulse: (!) 58    Not recorded     There is no height or weight on file to calculate BMI.  PHYSICAL EXAMNIATION:  Gen: NAD, conversant, well nourised, well groomed                     Cardiovascular: Regular rate rhythm, no peripheral edema, warm, nontender. Eyes: Conjunctivae clear without exudates or hemorrhage Neck: Supple, no carotid bruits. Pulmonary: Clear to auscultation bilaterally   NEUROLOGICAL EXAM:  MENTAL STATUS: Speech/cognition: Awake, alert, oriented to history taking, and casual conversation, soft voice, slow reaction time,   CRANIAL NERVES: CN II: Visual fields are full to confrontation. Pupils are round equal and briskly reactive to light. CN III, IV, VI: extraocular movement are normal. No ptosis. CN V: Facial sensation is intact to light touch CN VII: Face is symmetric, she has mild to moderate bilateral eye closure, cheek puff weakness CN VIII: Hearing is normal to causal conversation. CN IX, X: Phonation is normal. CN XI: Head turning and shoulder shrug are intact  MOTOR: She has a mild neck flexor weakness, mild bilateral shoulder abduction, external rotation weakness, left worse than right, weak left grip, fixation of left arm on rapid rotating movement, significant bilateral lower extremity muscle weakness, with normal muscle tones, no rigidity noted, bradykinesia is proportional to muscle weakness,  R/L: Hip flexion 3-/3, knee flexion 3/3+, knee extension 3/3, ankle dorsiflexion3/3, ankle plantarflexion4/4, she also complains of right knee pain with passive movement  REFLEXES: Reflexes are trace and symmetric at the biceps, triceps, absent at knees and  ankles. Plantar responses are flexor.  SENSORY: Intact to light touch, pinprick and vibratory sensation are intact in fingers and toes.  COORDINATION: There is no trunk or limb dysmetria noted.  GAIT/STANCE: Deferred   DIAGNOSTIC DATA (LABS, IMAGING, TESTING) - I reviewed patient records, labs, notes,  testing and imaging myself where available.   ASSESSMENT AND PLAN  Genever Hentges is a 74 y.o. female   Progressive gait abnormality since early 2020, left worse than right, no sensory loss, Acute worsening leading to hospital admission on June 20-26 2021, was elevated CPK up to 18,000, UTI, acute renal failure, quick normalization  of CPK with IV fluid, antibiotic treatment. But weakness, lower extremity more than upper extremity, no longer ambulatory, also has evidence of bulbar weakness  Extensive neuro imaging study showed multilevel cervical degenerative disc disease, mild to moderate spinal stenosis at C3-4, C6-7, there was no cord signal abnormality,  MRI of the brain showed mild supratentorium small vessel disease, no significant abnormality on the MRI of thoracic and lumbar spine to explain her difficulties,  Differentiation diagnosis include central nervous system degenerative disorder, but there was no typical Parkinson's features, intrinsic muscle disease, could not rule out possibility of neuromuscular junctional disorder  EMG nerve conduction study  Laboratory evaluations  Continue physical therapy  Also discussed with her cousin Diane Snow about the potential need for long-term care due to her mobility,  Marcial Pacas, M.D. Ph.D.  [facility] [address], [place], [place] [address] Ph: [phone] Fax: [phone]  CC:  Mack Hook, MD [address] [place],  [place] [address]

## 2020-05-20 ENCOUNTER — Ambulatory Visit: Payer: Medicare HMO | Admitting: Internal Medicine

## 2020-05-23 ENCOUNTER — Ambulatory Visit (INDEPENDENT_AMBULATORY_CARE_PROVIDER_SITE_OTHER): Payer: Medicare HMO | Admitting: Neurology

## 2020-05-23 ENCOUNTER — Other Ambulatory Visit: Payer: Self-pay

## 2020-05-23 ENCOUNTER — Telehealth: Payer: Self-pay | Admitting: Neurology

## 2020-05-23 ENCOUNTER — Ambulatory Visit: Payer: Medicare HMO | Admitting: Neurology

## 2020-05-23 DIAGNOSIS — R269 Unspecified abnormalities of gait and mobility: Secondary | ICD-10-CM

## 2020-05-23 DIAGNOSIS — R531 Weakness: Secondary | ICD-10-CM

## 2020-05-23 DIAGNOSIS — N882 Stricture and stenosis of cervix uteri: Secondary | ICD-10-CM

## 2020-05-23 NOTE — Telephone Encounter (Signed)
The patient is currently at [facility] [phone]). Per vo by Dr. Krista Blue, the patient should be started on vitamin D3, 1000 units per day. I have left this information on the nursing supervisor's physician voicemail Rise Mu, RN). I have also followed up this call by faxing signed orders to her attention at [phone]. Our number was provided to call back with any questions. I also returned the call to the patient's family to left them know this has been completed.

## 2020-05-23 NOTE — Procedures (Signed)
Full Name: Diane Snow Gender: Female MRN #: [id number] Date of Birth: 05-Oct-1946    Visit Date: 05/23/2020 09:40 Age: 74 Years Examining Physician: Marcial Pacas, MD  Referring Physician: Marcial Pacas, MD History: 74 year old female presented with rhabdomyolysis, continued weakness, nonambulatory,  Summary of the test: Nerve conduction study: Left sural, superficial peroneal sensory responses were normal.  Left tibial, peroneal to EDB motor responses were normal. Bilateral median sensory response showed prolonged distal latency, right side is moderate also with decreased snap amplitude; left side is mild. Left ulnar sensory and motor responses were normal.  Bilateral median motor responses showed mild to moderately prolonged distal latency,  Electromyography: Selected needle examination was performed at bilateral lower extremity muscles, left upper extremity muscles, and left cervical paraspinal muscles.  There is evidence of diffusely increased insertional activity, but there was normal morphology motor unit potential with normal recruitment patterns.  Conclusion: This is an abnormal study.  There is electrodiagnostic evidence of bilateral median neuropathy across the wrist, consistent with moderate bilateral carpal tunnel syndromes.  There is evidence of muscle irritations, but there is no evidence of intrinsic muscle disease, the increased exertion activity at needle examinations is most likely related to her rhabdomyolysis, with CPK up to 18 K.    ------------------------------- Marcial Pacas, M.D. PhD  [facility] [address], [place] [address] Tel: [phone] Fax: [phone]  Verbal informed consent was obtained from the patient, patient was informed of potential risk of procedure, including bruising, bleeding, hematoma formation, infection, muscle weakness, muscle pain, numbness, among others.         [place]    Nerve / Sites Muscle Latency Ref.  Amplitude Ref. Rel Amp Segments Distance Velocity Ref. Area    ms ms mV mV %  cm m/s m/s mVms  L Median - APB     Wrist APB 5.5 ?4.4 3.1 ?4.0 100 Wrist - APB 7   9.5     Upper arm APB 9.4  2.6  85.9 Upper arm - Wrist 22 56 ?49 9.3  R Median - APB     Wrist APB 6.0 ?4.4 4.6 ?4.0 100 Wrist - APB 7   20.9     Upper arm APB 10.1  4.1  88.6 Upper arm - Wrist 21 51 ?49 18.4  L Ulnar - ADM     Wrist ADM 3.0 ?3.3 10.2 ?6.0 100 Wrist - ADM 7   36.2     B.Elbow ADM 6.0  10.3  101 B.Elbow - Wrist 19 62 ?49 37.4     A.Elbow ADM 7.7  9.0  87.4 A.Elbow - B.Elbow 10 59 ?49 33.7         A.Elbow - Wrist      L Peroneal - EDB     Ankle EDB 4.4 ?6.5 5.4 ?2.0 100 Ankle - EDB 9   16.5     Fib head EDB 9.5  4.9  90.6 Fib head - Ankle 24 47 ?44 15.4     Pop fossa EDB 11.4  5.1  104 Pop fossa - Fib head 10 51 ?44 15.6         Pop fossa - Ankle      L Tibial - AH     Ankle AH 4.5 ?5.8 5.9 ?4.0 100 Ankle - AH 9   13.4     Pop fossa AH 12.0  4.7  78.6 Pop fossa - Ankle 33 44 ?41 9.9  [place]    Nerve / Sites Rec. Site Peak Lat Ref.  Amp Ref. Segments Distance    ms ms V V  cm  L Sural - Ankle (Calf)     Calf Ankle 2.1 ?4.4 11 ?6 Calf - Ankle 14  L Superficial peroneal - Ankle     Lat leg Ankle 3.4 ?4.4 6 ?6 Lat leg - Ankle 14  L Median - Orthodromic (Dig II, Mid palm)     Dig II Wrist 4.8 ?3.4 10 ?10 Dig II - Wrist 13  R Median - Orthodromic (Dig II, Mid palm)     Dig II Wrist 5.4 ?3.4 4 ?10 Dig II - Wrist 13  L Ulnar - Orthodromic, (Dig V, Mid palm)     Dig V Wrist 3.0 ?3.1 9 ?5 Dig V - Wrist 73               F  Wave    Nerve F Lat Ref.   ms ms  L Tibial - AH 46.9 ?56.0  L Ulnar - ADM 25.5 ?32.0         EMG Summary Table    Spontaneous MUAP Recruitment  Muscle IA Fib PSW Fasc Other Amp Dur. Poly Pattern  L. Tibialis anterior Increased None None None _______ Normal Normal Normal Normal  L. Tibialis posterior Increased None None None _______ Normal Normal Normal Normal  L. Peroneus  longus Increased None None None _______ Normal Normal Normal Normal  L. Gastrocnemius (Medial head) Increased None None None _______ Normal Normal Normal Normal  L. Vastus lateralis Increased None None None _______ Normal Normal Normal Normal  R. Tibialis anterior Increased None None None _______ Normal Normal Normal Normal  R. Vastus lateralis Increased None None None _______ Normal Normal Normal Normal  L. Iliopsoas Increased 1+ 1+ None _______ Normal Normal Normal Normal  L. First dorsal interosseous Increased None None None _______ Normal Normal Normal Normal  L. Pronator teres Increased None None None _______ Normal Normal Normal Normal  L. Biceps brachii Increased None None None _______ Normal Normal Normal Normal  L. Deltoid Increased None None None _______ Normal Normal Normal Normal  L. Triceps brachii Increased None None None _______ Normal Normal Normal Normal  L. Cervical paraspinals Normal None None None _______ Normal Normal Normal Normal

## 2020-05-23 NOTE — Telephone Encounter (Addendum)
Laboratory evaluation showed normalization of ESR, thyroid functional test, continued evidence of elevated C-reactive protein   Will not change treatment plan at this point, will follow up MRI of the cervical spine with without contrast

## 2020-05-23 NOTE — Progress Notes (Signed)
HISTORICAL  Diane Snow is a 74 year old female, seen in request by her primary care physician Mack Hook, for evaluation of weakness, gait abnormality, she is accompanied by her cousin Diane Snow at today's visit on May 19, 2020.  I reviewed and summarized the referring note.  Past medical history Hypertension  She retired at [employer] at age 14, lives alone, her cousin Diane Snow has interaction with her seeing her regularly multiple times in a week, patient has been active all her life, actively involved in her community and church service,  Since summer 2020, she noticed gradual onset left-sided weakness, involving left arm, left leg, but denies pain, denies sensory loss, she began to cane, but still have independent life, driving, cooking, cleaning home, and visiting her community members and church member regularly, she contributed her left side weakness, gait abnormality to her left knee pain, which she receiving therapy, and the knee joint injection regularly.  She denies bowel and bladder incontinence,  She talked with her cousin Diane Snow multiple times during the week, last time she was at her baseline was on April 14, 2020, she was talking clear minded, but noticed to be lethargic, on June 20, Sunday, she was not answering her phone, on Diane Snow check on her on Sunday evening 5 PM, she was found slumped in her chair, hyperextending of her neck, confused, talking slowly, look like she has been in that position for a while, paramedic was called,  I reviewed hospital admission record from June 20 to 26 2021, most striking abnormality has significantly elevated CPK level up to 18 K, which was quickly normalized to 862 upon discharge needs aggressive hydration, there was also of acute renal failure creatinine up to 1.33, normalized to baseline 0.89, mild elevated AST of 156, BNP was elevated 271,  There was also noticeable left buttock region stage II pressure ulcer, partial  thickness loss of dermis present as shallow open wound with a red-pink wound bed, 1 large with 2 surrounding tears, there was also pressure ulcer along vertical:, Purple area of localized discolored intact skin, blood-filled blister due to damage of underlying soft tissue from pressure,  She was diagnosed with rhabdomyolysis, pressure injury of the skin,  She was also seen by neuro hospitalist, I personally reviewed MRI of cervical spine: Multilevel degenerative changes, congenital narrowing of canal, moderate multilevel spinal stenosis at C4-5, C5-6, no cord signal abnormality  MRI of the brain showed mild to moderate supratentorium small vessel disease, no acute abnormality  MRI of lumbar, edema in the paraspinal musculature on the left side mid and lower thoracic, and upper lumbar region, likely reflecting muscle strain, multilevel degenerative changes, no significant canal or foraminal stenosis  MRI of thoracic spine no significant canal stenosis   There was a 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  Echocardiogram showed mild left ventricular hypertrophy, grade 1 diastolic dysfunction,  Laboratory evaluations in June 2021: Magnesium was normal 1.8, CPK was 18250, on April 18, 2020, 2159, 1249, 862, C-reactive protein 3.1, ESR 43, urine myoglobin was 658 elevated, vitamin B12 1309, normal TSH  UPDATE May 23 2020: Patient continue complains of bilateral lower extremity weakness, nonambulatory,  Reviewed extensive laboratory evaluation, evidence of vitamin D deficiency level was 21.9, she should start vitamin D3 supplement 1000 units daily,  Also elevated ESR 52, in comparison to previous 43, and 25; CPK was significantly  elevated 23, previous was 3.1, 4.2,  Normalization of CPK 132, negative HIV, RPR, normal B12, magnesium, A1c, protein electrophoresis, acetylcholine receptor antibody,  On further questioning, reported that she does have urinary urgency, occasionally incontinence for few years, she denies significant sensory loss,  We personally reviewed MRIs, most noticeable abnormality was at MRI of cervical spine, congenital narrowing of the canal, multilevel cervical degenerative changes, with moderate spinal stenosis at C4-5, C5-6, no spinal cord signal changes Electrodiagnostic study today showed evidence of moderate carpal tunnel syndromes, but there was no evidence of intrinsic muscle disease, no evidence of active cervical, lumbar sacral radiculopathy.    REVIEW OF SYSTEMS: Full 14 system review of systems performed and notable only for as above All other review of systems were negative.  ALLERGIES: No Active Allergies  HOME MEDICATIONS: Current Outpatient Medications  Medication Sig Dispense Refill  . aspirin 81 MG tablet Take 81 mg by mouth daily.    . diclofenac sodium (VOLTAREN) 1 % GEL Apply 4 g topically 4 (four) times daily. 1 Tube 2  . feeding supplement, ENSURE ENLIVE, (ENSURE ENLIVE) LIQD Take 237 mLs by mouth 2 (two) times daily between meals.    . furosemide (LASIX) 20 MG tablet Take 20 mg by mouth daily.    . metoprolol succinate (TOPROL-XL) 50 MG 24 hr tablet Take 1 tablet (50 mg total) by mouth daily. Take with or immediately following a meal. 30 tablet 11  . multivitamin (PROSIGHT) TABS tablet Take 1 tablet by mouth daily.    . potassium chloride (K-DUR,KLOR-CON) 10 MEQ tablet Take 10 mEq by mouth daily.  4  . thiamine 100 MG tablet Take 1 tablet (100 mg total) by mouth daily.    . vitamin B-12 (CYANOCOBALAMIN) 1000 MCG tablet Take 1 tablet (1,000 mcg total) by mouth daily.     No current facility-administered medications for this visit.    PAST MEDICAL HISTORY: Past Medical  History:  Diagnosis Date  . Abnormal gait   . Allergy   . Arthritis   . Hx of adenomatous colonic polyps 09/27/2014  . Hypertension   . Osteoarthritis   . Rhabdomyolysis   . Weakness     PAST SURGICAL HISTORY: Past Surgical History:  Procedure Laterality Date  . BREAST BIOPSY Left 1976  . BREAST EXCISIONAL BIOPSY Left   . COLONOSCOPY    . FOOT SURGERY Left   . PARTIAL HYSTERECTOMY     fibroid tumors    FAMILY HISTORY: Family History  Problem Relation Age of Onset  . Kidney disease Mother        cause of death  . Hypertension Mother   . Stroke Mother   . Heart disease Father        CHF cause of death  . Hypertension Father   . Pneumonia Father   . Breast cancer Maternal Aunt   . Heart disease Brother        CHF cause of death  . Gout Brother   . Colon cancer Neg Hx   . Rectal cancer Neg Hx   . Stomach cancer Neg Hx   . Esophageal cancer Neg Hx   . Pancreatic cancer Neg Hx     SOCIAL HISTORY: Social History   Socioeconomic History  . Marital status: Single    Spouse name: Not on file  . Number of children: 0  . Years of education: 55  . Highest  education level: High school graduate  Occupational History  . Occupation: Retired  Tobacco Use  . Smoking status: Former Smoker    Packs/day: 1.00    Years: 20.00    Pack years: 20.00    Types: Cigarettes    Quit date: 03/30/2011    Years since quitting: 9.1  . Smokeless tobacco: Never Used  Vaping Use  . Vaping Use: Never used  Substance and Sexual Activity  . Alcohol use: Yes    Alcohol/week: 0.0 standard drinks    Comment: may drink one beer per day  . Drug use: No  . Sexual activity: Not on file  Other Topics Concern  . Not on file  Social History Narrative   Normally, she lives alone. She is currently at [facility] for rehab and PT.   Right-handed.   One cup caffeine per day.   Social Determinants of Health   Financial Resource Strain: Low Risk   . Difficulty of Paying Living Expenses: Not  hard at all  Food Insecurity: No Food Insecurity  . Worried About Charity fundraiser in the Last Year: Never true  . Ran Out of Food in the Last Year: Never true  Transportation Needs: No Transportation Needs  . Lack of Transportation (Medical): No  . Lack of Transportation (Non-Medical): No  Physical Activity:   . Days of Exercise per Week:   . Minutes of Exercise per Session:   Stress: No Stress Concern Present  . Feeling of Stress : Not at all  Social Connections: Unknown  . Frequency of Communication with Friends and Family: More than three times a week  . Frequency of Social Gatherings with Friends and Family: Twice a week  . Attends Religious Services: More than 4 times per year  . Active Member of Clubs or Organizations: Yes  . Attends Archivist Meetings: More than 4 times per year  . Marital Status: Not on file  Intimate Partner Violence: Not At Risk  . Fear of Current or Ex-Partner: No  . Emotionally Abused: No  . Physically Abused: No  . Sexually Abused: No     PHYSICAL EXAM   There were no vitals filed for this visit. Not recorded     There is no height or weight on file to calculate BMI.  PHYSICAL EXAMNIATION:  Gen: NAD, conversant, well nourised, well groomed                     Cardiovascular: Regular rate rhythm, no peripheral edema, warm, nontender. Eyes: Conjunctivae clear without exudates or hemorrhage Neck: Supple, no carotid bruits. Pulmonary: Clear to auscultation bilaterally   NEUROLOGICAL EXAM:  MENTAL STATUS: Speech/cognition: Awake, alert, oriented to history taking, and casual conversation, soft voice, slow reaction time,   CRANIAL NERVES: CN II: Visual fields are full to confrontation. Pupils are round equal and briskly reactive to light. CN III, IV, VI: extraocular movement are normal. No ptosis. CN V: Facial sensation is intact to light touch CN VII: Face is symmetric, she has mild to moderate bilateral eye closure, cheek  puff weakness CN VIII: Hearing is normal to causal conversation. CN IX, X: Phonation is normal. CN XI: Head turning and shoulder shrug are intact  MOTOR: She has a mild neck flexor weakness, mild bilateral shoulder abduction, external rotation weakness, left worse than right, weak left grip, fixation of left arm on rapid rotating movement, significant bilateral lower extremity muscle weakness, with normal muscle tones, no rigidity noted, bradykinesia is  proportional to muscle weakness,  R/L: Hip flexion 3-/3, knee flexion 3/3+, knee extension 3/4, ankle dorsiflexion 4/4, ankle plantarflexion4/4, she also complains of right knee pain with passive movement  REFLEXES: Reflexes are trace and symmetric at the biceps, triceps, hyperactive at knees and  ankles. Plantar responses are extensor bilaterally  SENSORY: Length dependent decreased light touch, pinprick, vibratory sensation to mid shin level  COORDINATION: There is no trunk or limb dysmetria noted.  GAIT/STANCE: Deferred   DIAGNOSTIC DATA (LABS, IMAGING, TESTING) - I reviewed patient records, labs, notes, testing and imaging myself where available.   ASSESSMENT AND PLAN  Donique Hammonds is a 74 y.o. female   Progressive gait abnormality since early 2020, left worse than right, no sensory loss, Acute worsening confusion, weakness since hospital admission on June 20-26 2021, was elevated CPK up to 18,000, UTI, acute renal failure, quick normalization of CPK with IV fluid, antibiotic treatment. Persistent bilateral lower extremity more than upper extremity weakness, gait abnormalities,  EMG nerve conduction study today show no evidence of intrinsic muscle disease, she continues to have lower extremity weakness, hyperreflexia at knees,  Most noticeable abnormality at MRI of neuro axis showed congenital cervical canal stenosis, moderate spinal canal stenosis at C4-5, C5-6,  Repeat MRI of cervical spine with without contrast, to rule  out cervical spondylitic myelopathy  Elevated ESR, C-reactive protein  Which is much higher than April 22, 2020, during hospital admission,  Differentiation diagnoses also include polymyalgia rheumatica, repeat laboratory evaluation including thyroid functional test,  She denies significant muscle achy pain,  If remains elevated, may consider trial of steroid  Marcial Pacas, M.D. Ph.D.  [facility] [address] [place], [place] [address] Ph: [phone] Fax: [phone]  CC:  Mack Hook, MD [address] [place],  [place] [address]

## 2020-05-23 NOTE — Telephone Encounter (Signed)
Patient's cousin, Mardene Celeste, stated they are not able to bring vitamin D3 to the facility where patient stays. She did not see it listed on the yellow sheet they were given after testing today. Best call back is [phone] or [name], sister in law, at [phone]

## 2020-05-24 ENCOUNTER — Telehealth: Payer: Self-pay | Admitting: Neurology

## 2020-05-24 LAB — C-REACTIVE PROTEIN: CRP: 37 mg/L — ABNORMAL HIGH (ref 0–10)

## 2020-05-24 LAB — THYROID PANEL WITH TSH
Free Thyroxine Index: 1.6 (ref 1.2–4.9)
T3 Uptake Ratio: 24 % (ref 24–39)
T4, Total: 6.6 ug/dL (ref 4.5–12.0)
TSH: 4.37 u[IU]/mL (ref 0.450–4.500)

## 2020-05-24 LAB — SEDIMENTATION RATE: Sed Rate: 22 mm/hr (ref 0–40)

## 2020-05-24 NOTE — Telephone Encounter (Addendum)
Left message for her cousin, Sherlean Foot, to return my call about her lab results.

## 2020-05-24 NOTE — Telephone Encounter (Addendum)
Please call patient, better her cousin Mardene Celeste, laboratory evaluation showed normalization of C-reactive protein, thyroid functional test, continue with evidence of elevated ESR   Will not change treatment plan at this point, please make sure she is on schedule for MRI of the cervical spine with without contrast

## 2020-05-24 NOTE — Telephone Encounter (Signed)
I have spoken with sister-in-law, Rilley Poulter. She verbalized understanding of the lab results. She will also share this information with Mardene Celeste. Provided our number to call back with any questions.

## 2020-05-25 ENCOUNTER — Telehealth: Payer: Self-pay | Admitting: Neurology

## 2020-05-25 DIAGNOSIS — R531 Weakness: Secondary | ICD-10-CM

## 2020-05-25 NOTE — Telephone Encounter (Signed)
[facility] Josem Kaufmann: [id number] (exp. 05/25/20 to 06/24/20) order sent to GI. They will reach out to the patient to schedule.

## 2020-05-26 LAB — MULTIPLE MYELOMA PANEL, SERUM
Albumin SerPl Elph-Mcnc: 3.4 g/dL (ref 2.9–4.4)
Albumin/Glob SerPl: 0.9 (ref 0.7–1.7)
Alpha 1: 0.3 g/dL (ref 0.0–0.4)
Alpha2 Glob SerPl Elph-Mcnc: 0.8 g/dL (ref 0.4–1.0)
B-Globulin SerPl Elph-Mcnc: 1 g/dL (ref 0.7–1.3)
Gamma Glob SerPl Elph-Mcnc: 1.8 g/dL (ref 0.4–1.8)
Globulin, Total: 3.9 g/dL (ref 2.2–3.9)
IgA/Immunoglobulin A, Serum: 290 mg/dL (ref 64–422)
IgG (Immunoglobin G), Serum: 1859 mg/dL — ABNORMAL HIGH (ref 586–1602)
IgM (Immunoglobulin M), Srm: 64 mg/dL (ref 26–217)
Total Protein: 7.3 g/dL (ref 6.0–8.5)

## 2020-05-26 LAB — VITAMIN D 25 HYDROXY (VIT D DEFICIENCY, FRACTURES): Vit D, 25-Hydroxy: 21.9 ng/mL — ABNORMAL LOW (ref 30.0–100.0)

## 2020-05-26 LAB — SEDIMENTATION RATE: Sed Rate: 52 mm/hr — ABNORMAL HIGH (ref 0–40)

## 2020-05-26 LAB — IRON AND TIBC
Iron Saturation: 14 % — ABNORMAL LOW (ref 15–55)
Iron: 33 ug/dL (ref 27–139)
Total Iron Binding Capacity: 233 ug/dL — ABNORMAL LOW (ref 250–450)
UIBC: 200 ug/dL (ref 118–369)

## 2020-05-26 LAB — COPPER, SERUM: Copper: 150 ug/dL (ref 80–158)

## 2020-05-26 LAB — RPR: RPR Ser Ql: NONREACTIVE

## 2020-05-26 LAB — ACETYLCHOLINE RECEPTOR AB, ALL
AChR Binding Ab, Serum: <0.03 nmol/L (ref 0.00–0.24)
Acetylchol Block Ab: 5 % (ref 0–25)
Acetylcholine Modulat Ab: <12 % (ref 0–20)

## 2020-05-26 LAB — ANA W/REFLEX IF POSITIVE: Anti Nuclear Antibody (ANA): NEGATIVE

## 2020-05-26 LAB — C-REACTIVE PROTEIN: CRP: 23 mg/L — ABNORMAL HIGH (ref 0–10)

## 2020-05-26 LAB — HIV ANTIBODY (ROUTINE TESTING W REFLEX): HIV Screen 4th Generation wRfx: NONREACTIVE

## 2020-05-26 LAB — FOLATE: Folate: 14.9 ng/mL (ref >3.0)

## 2020-05-26 LAB — LACTATE DEHYDROGENASE: LDH: 257 IU/L — ABNORMAL HIGH (ref 119–226)

## 2020-05-26 LAB — HGB A1C W/O EAG: Hgb A1c MFr Bld: 5.2 % (ref 4.8–5.6)

## 2020-05-26 LAB — FERRITIN: Ferritin: 411 ng/mL — ABNORMAL HIGH (ref 15–150)

## 2020-05-26 LAB — VITAMIN B12: Vitamin B-12: 1350 pg/mL — ABNORMAL HIGH (ref 232–1245)

## 2020-05-26 LAB — CK: Total CK: 132 U/L (ref 32–182)

## 2020-05-26 NOTE — Telephone Encounter (Signed)
Order to sent to GI. They will reach out to them to schedule.

## 2020-05-31 NOTE — Telephone Encounter (Signed)
Orders Placed This Encounter  Procedures  . MR CERVICAL SPINE W WO CONTRAST

## 2020-05-31 NOTE — Addendum Note (Signed)
Addended by: Marcial Pacas on: 05/31/2020 01:43 PM   Modules accepted: Orders

## 2020-05-31 NOTE — Telephone Encounter (Signed)
Noted, patient is scheduled at [facility] for 06/10/20 arrival time is 11:30 AM. I left a voicemail for patient to be aware. I also left their number of [phone] incase she needed to r/s.

## 2020-05-31 NOTE — Telephone Encounter (Signed)
When you get a chance can put a new MRI order in for [facility] due to she is in a wheel chair and needs a lift to help her.

## 2020-06-03 DIAGNOSIS — L89159 Pressure ulcer of sacral region, unspecified stage: Secondary | ICD-10-CM | POA: Diagnosis not present

## 2020-06-03 DIAGNOSIS — I10 Essential (primary) hypertension: Secondary | ICD-10-CM | POA: Diagnosis not present

## 2020-06-03 DIAGNOSIS — E46 Unspecified protein-calorie malnutrition: Secondary | ICD-10-CM | POA: Diagnosis not present

## 2020-06-03 DIAGNOSIS — R4189 Other symptoms and signs involving cognitive functions and awareness: Secondary | ICD-10-CM | POA: Diagnosis not present

## 2020-06-09 DIAGNOSIS — R609 Edema, unspecified: Secondary | ICD-10-CM | POA: Diagnosis not present

## 2020-06-09 DIAGNOSIS — E46 Unspecified protein-calorie malnutrition: Secondary | ICD-10-CM | POA: Diagnosis not present

## 2020-06-09 DIAGNOSIS — M6281 Muscle weakness (generalized): Secondary | ICD-10-CM | POA: Diagnosis not present

## 2020-06-09 DIAGNOSIS — G250 Essential tremor: Secondary | ICD-10-CM | POA: Diagnosis not present

## 2020-06-09 DIAGNOSIS — M25551 Pain in right hip: Secondary | ICD-10-CM | POA: Diagnosis not present

## 2020-06-09 DIAGNOSIS — R2689 Other abnormalities of gait and mobility: Secondary | ICD-10-CM | POA: Diagnosis not present

## 2020-06-10 ENCOUNTER — Ambulatory Visit (HOSPITAL_COMMUNITY): Admission: RE | Admit: 2020-06-10 | Payer: Medicare HMO | Source: Ambulatory Visit

## 2020-06-15 DIAGNOSIS — M6281 Muscle weakness (generalized): Secondary | ICD-10-CM | POA: Diagnosis not present

## 2020-06-15 DIAGNOSIS — E46 Unspecified protein-calorie malnutrition: Secondary | ICD-10-CM | POA: Diagnosis not present

## 2020-06-15 DIAGNOSIS — G250 Essential tremor: Secondary | ICD-10-CM | POA: Diagnosis not present

## 2020-06-15 DIAGNOSIS — R2689 Other abnormalities of gait and mobility: Secondary | ICD-10-CM | POA: Diagnosis not present

## 2020-06-15 DIAGNOSIS — I10 Essential (primary) hypertension: Secondary | ICD-10-CM | POA: Diagnosis not present

## 2020-06-22 DIAGNOSIS — L8992 Pressure ulcer of unspecified site, stage 2: Secondary | ICD-10-CM | POA: Diagnosis not present

## 2020-06-22 DIAGNOSIS — M25551 Pain in right hip: Secondary | ICD-10-CM | POA: Diagnosis not present

## 2020-06-22 DIAGNOSIS — R229 Localized swelling, mass and lump, unspecified: Secondary | ICD-10-CM | POA: Diagnosis not present

## 2020-06-22 DIAGNOSIS — E46 Unspecified protein-calorie malnutrition: Secondary | ICD-10-CM | POA: Diagnosis not present

## 2020-06-22 DIAGNOSIS — G250 Essential tremor: Secondary | ICD-10-CM | POA: Diagnosis not present

## 2020-06-22 DIAGNOSIS — I10 Essential (primary) hypertension: Secondary | ICD-10-CM | POA: Diagnosis not present

## 2020-06-24 ENCOUNTER — Other Ambulatory Visit: Payer: Self-pay

## 2020-06-24 ENCOUNTER — Ambulatory Visit (HOSPITAL_COMMUNITY)
Admission: RE | Admit: 2020-06-24 | Discharge: 2020-06-24 | Disposition: A | Discharge status: Home / Self Care | Payer: Medicare HMO | Source: Ambulatory Visit | Attending: Neurology | Admitting: Neurology

## 2020-06-24 DIAGNOSIS — M50221 Other cervical disc displacement at C4-C5 level: Secondary | ICD-10-CM | POA: Diagnosis not present

## 2020-06-24 DIAGNOSIS — M47812 Spondylosis without myelopathy or radiculopathy, cervical region: Secondary | ICD-10-CM | POA: Diagnosis not present

## 2020-06-24 DIAGNOSIS — R531 Weakness: Secondary | ICD-10-CM | POA: Diagnosis not present

## 2020-06-24 DIAGNOSIS — M50223 Other cervical disc displacement at C6-C7 level: Secondary | ICD-10-CM | POA: Diagnosis not present

## 2020-06-24 DIAGNOSIS — M4802 Spinal stenosis, cervical region: Secondary | ICD-10-CM | POA: Diagnosis not present

## 2020-06-24 MED ORDER — GADOBUTROL 1 MMOL/ML IV SOLN
6.0000 mL | Freq: Once | INTRAVENOUS | Status: AC | PRN
  Administered 2020-06-24: 6 mL via INTRAVENOUS

## 2020-06-27 ENCOUNTER — Telehealth: Payer: Self-pay | Admitting: Neurology

## 2020-06-27 NOTE — Telephone Encounter (Signed)
I spoke to her family member, Taelar Gronewold on [place]. She verbalized understanding of the findings. She also requested I leave this information on the patient's voicemail. I left the message and provided our number to call back with any questions.

## 2020-06-27 NOTE — Telephone Encounter (Signed)
The patient called back and she was provided with her cervical MRI results. She did not have any questions.

## 2020-06-27 NOTE — Telephone Encounter (Signed)
IMPRESSION: No definite abnormal cord signal. Multilevel degenerative changes as detailed above. No substantial progression since the prior study. There is no high-grade canal stenosis. Foraminal stenosis is greatest on the left at C4-C5.  Please call patient, MRI of the cervical showed mild cervical degenerative changes, no significant spinal canal or spinal nerve compression

## 2020-06-30 DIAGNOSIS — M79676 Pain in unspecified toe(s): Secondary | ICD-10-CM | POA: Diagnosis not present

## 2020-06-30 DIAGNOSIS — L602 Onychogryphosis: Secondary | ICD-10-CM | POA: Diagnosis not present

## 2020-06-30 DIAGNOSIS — G250 Essential tremor: Secondary | ICD-10-CM | POA: Diagnosis not present

## 2020-06-30 DIAGNOSIS — I10 Essential (primary) hypertension: Secondary | ICD-10-CM | POA: Diagnosis not present

## 2020-06-30 DIAGNOSIS — E46 Unspecified protein-calorie malnutrition: Secondary | ICD-10-CM | POA: Diagnosis not present

## 2020-06-30 DIAGNOSIS — M25551 Pain in right hip: Secondary | ICD-10-CM | POA: Diagnosis not present

## 2020-07-01 ENCOUNTER — Ambulatory Visit
Admission: RE | Admit: 2020-07-01 | Discharge: 2020-07-01 | Disposition: A | Discharge status: Home / Self Care | Payer: Medicare HMO | Source: Ambulatory Visit | Attending: Internal Medicine | Admitting: Internal Medicine

## 2020-07-01 ENCOUNTER — Other Ambulatory Visit: Payer: Self-pay

## 2020-07-01 DIAGNOSIS — E2839 Other primary ovarian failure: Secondary | ICD-10-CM

## 2020-07-01 DIAGNOSIS — M85832 Other specified disorders of bone density and structure, left forearm: Secondary | ICD-10-CM | POA: Diagnosis not present

## 2020-07-01 DIAGNOSIS — Z780 Asymptomatic menopausal state: Secondary | ICD-10-CM | POA: Diagnosis not present

## 2020-07-02 DIAGNOSIS — G250 Essential tremor: Secondary | ICD-10-CM | POA: Diagnosis not present

## 2020-07-02 DIAGNOSIS — R609 Edema, unspecified: Secondary | ICD-10-CM | POA: Diagnosis not present

## 2020-07-02 DIAGNOSIS — M6281 Muscle weakness (generalized): Secondary | ICD-10-CM | POA: Diagnosis not present

## 2020-07-02 DIAGNOSIS — R2689 Other abnormalities of gait and mobility: Secondary | ICD-10-CM | POA: Diagnosis not present

## 2020-07-02 DIAGNOSIS — E46 Unspecified protein-calorie malnutrition: Secondary | ICD-10-CM | POA: Diagnosis not present

## 2020-07-06 ENCOUNTER — Telehealth: Payer: Self-pay | Admitting: Neurology

## 2020-07-06 NOTE — Telephone Encounter (Signed)
Please call patient or her cousin, I can refer her to physical therapy, either outpatient or in-home physical therapy  Also keep her follow-up appointment in 1 to 2 months, following few sessions of physical therapy

## 2020-07-06 NOTE — Telephone Encounter (Signed)
[name], Diane(which is not on DPR) is asking that the cousin to pt -Diane [name](on DPR) be called with the results to the MRI and bone density scan.  [name], Diane Celeste is asking that Diane Baas be called re: what can be done since pt is still unable to walk.  Diane Baas can be reached at [phone]

## 2020-07-06 NOTE — Telephone Encounter (Addendum)
I returned the call to the patient's cousin on [place].  She reports the patient is having increased mobility issues along with numbness in her legs.  The patient is currently in a rehab facility:  [facility] [address] [place], [place] [address] Ph: [phone]  I called [facility] and spoke to the director of nursing, Willis Modena (direct [phone]). She confirmed that the patient is currently getting PT/OT five times weekly.   [name] would like a phone call back with the following:  1) next step options 2) patient's diagnosis  Her contact number is [phone].

## 2020-07-06 NOTE — Telephone Encounter (Signed)
Mianna, [place] (cousin on [place]) has called Michelle,RN back.  Please call her back

## 2020-07-07 NOTE — Telephone Encounter (Signed)
I also received a call back from [employer], Cheryle Horsfall. States patient requires moderate to max assist with mobility/personal care. Since being in therapy, she has not shown any further decline but there has not been any improvement either.

## 2020-07-07 NOTE — Telephone Encounter (Signed)
We had extensive evaluations, failed to demonstrate treatable etiology so far  I received her in clinic again for reevaluation if she and her family wish

## 2020-07-07 NOTE — Telephone Encounter (Signed)
I returned the call her cousin, Ryelynn Guedea (on [place]), and reviewed the information below. Her cousin states the numbness is really just in her right thigh. Her gait is unchanged. She would like for the patient to be evaluated again. I scheduled an appt for her on 07/18/20 at 3:30pm.  I called [facility] and spoke to Florence in transportation. She is aware of the patient's appt time and location. They will have her at our office at 3pm for check-in.

## 2020-07-08 DIAGNOSIS — L89159 Pressure ulcer of sacral region, unspecified stage: Secondary | ICD-10-CM | POA: Diagnosis not present

## 2020-07-08 DIAGNOSIS — R4189 Other symptoms and signs involving cognitive functions and awareness: Secondary | ICD-10-CM | POA: Diagnosis not present

## 2020-07-08 DIAGNOSIS — I10 Essential (primary) hypertension: Secondary | ICD-10-CM | POA: Diagnosis not present

## 2020-07-08 DIAGNOSIS — E46 Unspecified protein-calorie malnutrition: Secondary | ICD-10-CM | POA: Diagnosis not present

## 2020-07-11 DIAGNOSIS — Z20828 Contact with and (suspected) exposure to other viral communicable diseases: Secondary | ICD-10-CM | POA: Diagnosis not present

## 2020-07-14 DIAGNOSIS — M25551 Pain in right hip: Secondary | ICD-10-CM | POA: Diagnosis not present

## 2020-07-14 DIAGNOSIS — M79676 Pain in unspecified toe(s): Secondary | ICD-10-CM | POA: Diagnosis not present

## 2020-07-14 DIAGNOSIS — Z20828 Contact with and (suspected) exposure to other viral communicable diseases: Secondary | ICD-10-CM | POA: Diagnosis not present

## 2020-07-14 DIAGNOSIS — I10 Essential (primary) hypertension: Secondary | ICD-10-CM | POA: Diagnosis not present

## 2020-07-14 DIAGNOSIS — G250 Essential tremor: Secondary | ICD-10-CM | POA: Diagnosis not present

## 2020-07-14 DIAGNOSIS — E46 Unspecified protein-calorie malnutrition: Secondary | ICD-10-CM | POA: Diagnosis not present

## 2020-07-18 ENCOUNTER — Other Ambulatory Visit: Payer: Self-pay

## 2020-07-18 ENCOUNTER — Ambulatory Visit (INDEPENDENT_AMBULATORY_CARE_PROVIDER_SITE_OTHER): Payer: Medicare HMO | Admitting: Neurology

## 2020-07-18 VITALS — BP 155/85 | HR 58 | Ht <= 58 in

## 2020-07-18 DIAGNOSIS — R531 Weakness: Secondary | ICD-10-CM | POA: Diagnosis not present

## 2020-07-18 DIAGNOSIS — N882 Stricture and stenosis of cervix uteri: Secondary | ICD-10-CM | POA: Diagnosis not present

## 2020-07-18 DIAGNOSIS — M6281 Muscle weakness (generalized): Secondary | ICD-10-CM | POA: Diagnosis not present

## 2020-07-18 DIAGNOSIS — R269 Unspecified abnormalities of gait and mobility: Secondary | ICD-10-CM | POA: Diagnosis not present

## 2020-07-18 DIAGNOSIS — M25551 Pain in right hip: Secondary | ICD-10-CM | POA: Diagnosis not present

## 2020-07-18 DIAGNOSIS — R2689 Other abnormalities of gait and mobility: Secondary | ICD-10-CM | POA: Diagnosis not present

## 2020-07-18 DIAGNOSIS — E46 Unspecified protein-calorie malnutrition: Secondary | ICD-10-CM | POA: Diagnosis not present

## 2020-07-18 DIAGNOSIS — G250 Essential tremor: Secondary | ICD-10-CM | POA: Diagnosis not present

## 2020-07-18 DIAGNOSIS — I10 Essential (primary) hypertension: Secondary | ICD-10-CM | POA: Diagnosis not present

## 2020-07-18 DIAGNOSIS — M79676 Pain in unspecified toe(s): Secondary | ICD-10-CM | POA: Diagnosis not present

## 2020-07-18 MED ORDER — CARBIDOPA-LEVODOPA 25-100 MG PO TABS: 1.0000 | ORAL_TABLET | Freq: Three times a day (TID) | ORAL | 11 refills | Status: DC

## 2020-07-18 MED ORDER — CARBIDOPA-LEVODOPA 25-100 MG PO TABS
1.0000 | ORAL_TABLET | Freq: Three times a day (TID) | ORAL | 11 refills | Status: DC
Start: 2020-07-18 — End: 2020-07-18

## 2020-07-18 NOTE — Progress Notes (Signed)
° °HISTORICAL ° °Diane Snow is a 74-year-old female, seen in request by her primary care physician Diane Snow, for evaluation of weakness, gait abnormality, she is accompanied by her cousin Diane Snow at today's visit on May 19, 2020. ° °I reviewed and summarized the referring note.  Past medical history °Hypertension ° °She retired at textile plant at age 65, lives alone, her cousin Diane Snow has interaction with her seeing her regularly multiple times in a week, patient has been active all her life, actively involved in her community and church service, ° °Since summer 2020, she noticed gradual onset left-sided weakness, involving left arm, left leg, but denies pain, denies sensory loss, she began to cane, but still have independent life, driving, cooking, cleaning home, and visiting her community members and church member regularly, she contributed her left side weakness, gait abnormality to her left knee pain, which she receiving therapy, and the knee joint injection regularly.  She denies bowel and bladder incontinence, ° °She talked with her cousin Diane Snow multiple times during the week, last time she was at her baseline was on April 14, 2020, she was talking clear minded, but noticed to be lethargic, on June 20, Sunday, she was not answering her phone, when Diane Snow check on her on Sunday evening 5 PM, she was found slumped in her chair, hyperextending of her neck, confused, talking slowly, look like she has been in that position for a while, paramedic was called, ° °I reviewed hospital admission record from June 20 to 26 2021, most striking abnormality was significantly elevated CPK level up to 18 K, which was quickly normalized to 862 upon discharge after aggressive hydration,  acute renal failure creatinine up to 1.33, normalized to baseline 0.89, mild elevated AST of 156, BNP was elevated 271, ° °There was also noticeable left buttock region stage II pressure ulcer, partial thickness loss of  dermis present as shallow open wound with a red-pink wound bed, 1 large with 2 surrounding tears, there was also pressure ulcer along vetebral, per description, Purple area of localized discolored intact skin, blood-filled blister due to damage of underlying soft tissue from pressure, ° °She was diagnosed with rhabdomyolysis, pressure injury of the skin, ° °She was seen by neuro hospitalist, °I personally reviewed MRI of cervical spine: Multilevel degenerative changes, congenital narrowing of canal, moderate multilevel spinal stenosis at C4-5, C5-6, no cord signal abnormality ° °MRI of the brain showed mild to moderate supratentorium small vessel disease, no acute abnormality ° °MRI of lumbar, edema in the paraspinal musculature on the left side mid and lower thoracic, and upper lumbar region, likely reflecting muscle strain, multilevel degenerative changes, no significant canal or foraminal stenosis ° °MRI of thoracic spine no significant canal stenosis ° °There was a concern of parkinsonian features, have suggested Sinemet 25/100 mg 3 times a day, without improvement, ° °Since the hospital admission, she is no longer ambulatory, she is currently at [facility], is concerned about with decreased functional ability, potential long-term placement issues. ° °Echocardiogram showed mild left ventricular hypertrophy, grade 1 diastolic dysfunction, ° °Laboratory evaluations in June 2021: Magnesium was normal 1.8, CPK was 18250, on April 18, 2020, 2159, 1249, 862, C-reactive protein 3.1, ESR 43, urine myoglobin was 658 elevated, vitamin B12 1309, normal TSH ° °UPDATE Sept 20 2021: °She is brought in by her cousin Diane Snow at today's clinical visit, she continue lives at nursing home, progressive gait abnormality, no longer ambulatory, needing help in transfer, ° °EMG nerve conduction study on   on May 23, 2020, showed evidence of moderate bilateral carpal tunnel syndromes, there was increased insertional  activity at needle examination, but there was no significant neuropathic or myopathic changes. Laboratory evaluation showed normal thyroid functional test, ESR, acetylcholine receptor binding antibody, mild elevated C-reactive protein 37, protein electrophoresis,A1c was 5.2, vitamin D 22, ferritin 411,ANA negative,CPK 132.    REVIEW OF SYSTEMS: Full 14 system review of systems performed and notable only for as above All other review of systems were negative.  ALLERGIES: No Known Allergies  HOME MEDICATIONS: Current Outpatient Medications  Medication Sig Dispense Refill   aspirin 81 MG tablet Take 81 mg by mouth daily.     diclofenac sodium (VOLTAREN) 1 % GEL Apply 4 g topically 4 (four) times daily. 1 Tube 2   feeding supplement, ENSURE ENLIVE, (ENSURE ENLIVE) LIQD Take 237 mLs by mouth 2 (two) times daily between meals.     furosemide (LASIX) 20 MG tablet Take 20 mg by mouth daily.     metoprolol succinate (TOPROL-XL) 50 MG 24 hr tablet Take 1 tablet (50 mg total) by mouth daily. Take with or immediately following a meal. 30 tablet 11   multivitamin (PROSIGHT) TABS tablet Take 1 tablet by mouth daily.     potassium chloride (K-DUR,KLOR-CON) 10 MEQ tablet Take 10 mEq by mouth daily.  4   thiamine 100 MG tablet Take 1 tablet (100 mg total) by mouth daily.     vitamin B-12 (CYANOCOBALAMIN) 1000 MCG tablet Take 1 tablet (1,000 mcg total) by mouth daily.     No current facility-administered medications for this visit.    PAST MEDICAL HISTORY: Past Medical History:  Diagnosis Date   Abnormal gait    Allergy    Arthritis    Hx of adenomatous colonic polyps 09/27/2014   Hypertension    Osteoarthritis    Rhabdomyolysis    Weakness     PAST SURGICAL HISTORY: Past Surgical History:  Procedure Laterality Date   BREAST BIOPSY Left 1976   BREAST EXCISIONAL BIOPSY Left    COLONOSCOPY     FOOT SURGERY Left    PARTIAL HYSTERECTOMY     fibroid tumors    FAMILY  HISTORY: Family History  Problem Relation Age of Onset   Kidney disease Mother        cause of death   Hypertension Mother    Stroke Mother    Heart disease Father        CHF cause of death   Hypertension Father    Pneumonia Father    Breast cancer Maternal Aunt    Heart disease Brother        CHF cause of death   Gout Brother    Colon cancer Neg Hx    Rectal cancer Neg Hx    Stomach cancer Neg Hx    Esophageal cancer Neg Hx    Pancreatic cancer Neg Hx     SOCIAL HISTORY: Social History   Socioeconomic History   Marital status: Single    Spouse name: Not on file   Number of children: 0   Years of education: 12   Highest education level: High school graduate  Occupational History   Occupation: Retired  Tobacco Use   Smoking status: Former Smoker    Packs/day: 1.00    Years: 20.00    Pack years: 20.00    Types: Cigarettes    Quit date: 03/30/2011    Years since quitting: 9.3   Smokeless tobacco: Never Used  Vaping  Vaping Use: Never used  °Substance and Sexual Activity  °• Alcohol use: Yes  °  Alcohol/week: 0.0 standard drinks  °  Comment: may drink one beer per day  °• Drug use: No  °• Sexual activity: Not on file  °Other Topics Concern  °• Not on file  °Social History Narrative  ° Normally, she lives alone. She is currently at [facility] for rehab and PT.  ° Right-handed.  ° One cup caffeine per day.  ° °Social Determinants of Health  ° °Financial Resource Strain: Low Risk   °• Difficulty of Paying Living Expenses: Not hard at all  °Food Insecurity: No Food Insecurity  °• Worried About Running Out of Food in the Last Year: Never true  °• Ran Out of Food in the Last Year: Never true  °Transportation Needs: No Transportation Needs  °• Lack of Transportation (Medical): No  °• Lack of Transportation (Non-Medical): No  °Physical Activity:   °• Days of Exercise per Week: Not on file  °• Minutes of Exercise per Session: Not on file  °Stress: No Stress  Concern Present  °• Feeling of Stress : Not at all  °Social Connections: Unknown  °• Frequency of Communication with Friends and Family: More than three times a week  °• Frequency of Social Gatherings with Friends and Family: Twice a week  °• Attends Religious Services: More than 4 times per year  °• Active Member of Clubs or Organizations: Yes  °• Attends Club or Organization Meetings: More than 4 times per year  °• Marital Status: Not on file  °Intimate Partner Violence: Not At Risk  °• Fear of Current or Ex-Partner: No  °• Emotionally Abused: No  °• Physically Abused: No  °• Sexually Abused: No  ° ° ° °PHYSICAL EXAM °  °Vitals:  ° 07/18/20 1539  °BP: (!) 155/85  °Pulse: (!) 58  °SpO2: 94%  ° °Not recorded °  ° ° °There is no height or weight on file to calculate BMI. ° °PHYSICAL EXAMNIATION: ° °Gen: NAD, conversant, well nourised, well groomed                     °Cardiovascular: Regular rate rhythm, no peripheral edema, warm, nontender. °Eyes: Conjunctivae clear without exudates or hemorrhage °Neck: Supple, no carotid bruits. °Pulmonary: Clear to auscultation bilaterally  ° °NEUROLOGICAL EXAM: ° °MENTAL STATUS: °Speech/cognition: Awake, alert, oriented to history taking, and casual conversation, soft voice, slow reaction time, mild slow spastic speech °  °CRANIAL NERVES: °CN II: Visual fields are full to confrontation. Pupils are round equal and briskly reactive to light. °CN III, IV, VI: extraocular movement are normal. No ptosis. °CN V: Facial sensation is intact to light touch °CN VII: Face is symmetric, she has mild to moderate bilateral eye closure, cheek puff weakness °CN VIII: Hearing is normal to causal conversation. °CN IX, X: Phonation is normal. °CN XI: Head turning and shoulder shrug are intact ° °MOTOR: °She has mild neck flexor weakness,  °Fairly symmetric bilateral upper and lower extremity rigidity, bradykinesia °mild bilateral shoulder abduction, external rotation weakness, mild bilateral hand  grip weakness, significant bilateral lower extremity muscle weakness, she has °R/L: Hip flexion 3-/3, knee flexion 3/3+, knee extension 3/4 ankle dorsiflexion 4/4, ankle plantarflexion 5/5,  ° °REFLEXES: °Reflexes are trace and symmetric at the biceps, triceps, absent at knees and  ankles. Plantar responses are flexor. ° °SENSORY: °Intact to light touch, pinprick and vibratory sensation are intact in fingers and toes. ° °COORDINATION: °  There is no trunk or limb dysmetria noted. ° °GAIT/STANCE: °Needs 2 people assistant to get up from seated position, leaning backwards, could not initiate gait ° ° °DIAGNOSTIC DATA (LABS, IMAGING, TESTING) °- I reviewed patient records, labs, notes, testing and imaging myself where available. ° ° °ASSESSMENT AND PLAN ° °Shaylene Lee Kingdon is a 74 y.o. female   °Progressive gait abnormality since early 2020, left worse than right, no sensory loss, °Acute worsening leading to hospital admission on June 20-26 2021, with elevated CPK up to 18,000, UTI, acute renal failure, quick normalization of CPK with IV fluid, antibiotic treatment. °But weakness, lower extremity more than upper extremity, no longer ambulatory, also has evidence of bulbar weakness ° Extensive neuro imaging study showed multilevel cervical degenerative disc disease, mild to moderate spinal stenosis at C3-4, C6-7, there was no cord signal abnormality, ° MRI of the brain showed mild supratentorium small vessel disease, no significant abnormality on the MRI of thoracic and lumbar spine to explain her difficulties, ° EMG nerve conduction study only showed moderate bilateral carpal tunnel syndrome, no evidence of intrinsic muscle disease, right cervical or right lumbar radiculopathy ° Laboratory evaluations showed no treatable etiology ° Differentiation diagnosis including central nervous system degenerative disorder, such as Parkinson plus syndrome, progressive supranuclear palsy, ° DaTSCAN ° Empirically treat with Sinemet  25/100 mg 3 times a day ° Referral to [facility] ° Return to clinic in 4 to 6 months ° ° , M.D. Ph.D. ° °[facility] °[address], [address] °[place], [place] [address] °Ph: [phone] °Fax: [phone] ° °CC:  [name], Elizabeth, MD °[address] °[place],  [place] [address]   °

## 2020-07-20 ENCOUNTER — Encounter: Payer: Self-pay | Admitting: Neurology

## 2020-07-21 ENCOUNTER — Telehealth: Payer: Self-pay | Admitting: Neurology

## 2020-07-21 NOTE — Telephone Encounter (Addendum)
Please advise [facility] first available is April 22 at 3:40 2022 . With Dr. Buck Mam.  Patient is on CX list.  Would you like me to get her sent to [facility] or [facility] ?   Patient's contact for facility she is in . Abigail Butts [phone].

## 2020-07-21 NOTE — Telephone Encounter (Signed)
Ok to try [facility] or [facility], better to be seen sooner

## 2020-07-21 NOTE — Telephone Encounter (Signed)
Noted I will get her sent to [facility] and give Abigail Butts update and go with [facility] .  Patient's contact for facility she is in . Abigail Butts [phone]

## 2020-07-23 DIAGNOSIS — F331 Major depressive disorder, recurrent, moderate: Secondary | ICD-10-CM | POA: Diagnosis not present

## 2020-07-23 DIAGNOSIS — G250 Essential tremor: Secondary | ICD-10-CM | POA: Diagnosis not present

## 2020-07-23 DIAGNOSIS — L8992 Pressure ulcer of unspecified site, stage 2: Secondary | ICD-10-CM | POA: Diagnosis not present

## 2020-07-23 DIAGNOSIS — M6281 Muscle weakness (generalized): Secondary | ICD-10-CM | POA: Diagnosis not present

## 2020-07-23 DIAGNOSIS — R609 Edema, unspecified: Secondary | ICD-10-CM | POA: Diagnosis not present

## 2020-07-23 DIAGNOSIS — L602 Onychogryphosis: Secondary | ICD-10-CM | POA: Diagnosis not present

## 2020-07-23 DIAGNOSIS — E46 Unspecified protein-calorie malnutrition: Secondary | ICD-10-CM | POA: Diagnosis not present

## 2020-07-23 DIAGNOSIS — I10 Essential (primary) hypertension: Secondary | ICD-10-CM | POA: Diagnosis not present

## 2020-07-27 DIAGNOSIS — G250 Essential tremor: Secondary | ICD-10-CM | POA: Diagnosis not present

## 2020-07-27 DIAGNOSIS — E46 Unspecified protein-calorie malnutrition: Secondary | ICD-10-CM | POA: Diagnosis not present

## 2020-07-27 DIAGNOSIS — M25551 Pain in right hip: Secondary | ICD-10-CM | POA: Diagnosis not present

## 2020-07-27 DIAGNOSIS — R609 Edema, unspecified: Secondary | ICD-10-CM | POA: Diagnosis not present

## 2020-07-27 DIAGNOSIS — I10 Essential (primary) hypertension: Secondary | ICD-10-CM | POA: Diagnosis not present

## 2020-07-27 DIAGNOSIS — R2689 Other abnormalities of gait and mobility: Secondary | ICD-10-CM | POA: Diagnosis not present

## 2020-07-30 DIAGNOSIS — F331 Major depressive disorder, recurrent, moderate: Secondary | ICD-10-CM | POA: Diagnosis not present

## 2020-08-02 DIAGNOSIS — E46 Unspecified protein-calorie malnutrition: Secondary | ICD-10-CM | POA: Diagnosis not present

## 2020-08-02 DIAGNOSIS — G250 Essential tremor: Secondary | ICD-10-CM | POA: Diagnosis not present

## 2020-08-02 DIAGNOSIS — I10 Essential (primary) hypertension: Secondary | ICD-10-CM | POA: Diagnosis not present

## 2020-08-02 DIAGNOSIS — M25551 Pain in right hip: Secondary | ICD-10-CM | POA: Diagnosis not present

## 2020-08-03 ENCOUNTER — Other Ambulatory Visit: Payer: Self-pay

## 2020-08-03 NOTE — Patient Outreach (Signed)
[facility]) Care Management  08/03/2020  Diane Snow 20-Dec-1945 [id number]     Transition of Care Referral  Referral Date: 08/03/2020 Referral Source: [facility] Discharge Report Date of Discharge: 08/02/2020 Facility: [facility]: [facility] Medicare    Referral received. Transition of care calls being completed via EMMI-automated calls. RN CM will outreach patient for any red flags received.     Plan: RN CM will close case at this time.   Enzo Montgomery, RN,BSN,CCM [facility] Telephonic Care Management Coordinator Direct Phone: [phone] Toll Free: [phone] Fax: [phone]

## 2020-08-09 DIAGNOSIS — M6282 Rhabdomyolysis: Secondary | ICD-10-CM | POA: Diagnosis not present

## 2020-08-09 DIAGNOSIS — M6281 Muscle weakness (generalized): Secondary | ICD-10-CM | POA: Diagnosis not present

## 2020-08-09 DIAGNOSIS — R2681 Unsteadiness on feet: Secondary | ICD-10-CM | POA: Diagnosis not present

## 2020-08-09 DIAGNOSIS — R531 Weakness: Secondary | ICD-10-CM | POA: Diagnosis not present

## 2020-08-09 DIAGNOSIS — I10 Essential (primary) hypertension: Secondary | ICD-10-CM | POA: Diagnosis not present

## 2020-08-09 DIAGNOSIS — R2689 Other abnormalities of gait and mobility: Secondary | ICD-10-CM | POA: Diagnosis not present

## 2020-08-09 DIAGNOSIS — M1711 Unilateral primary osteoarthritis, right knee: Secondary | ICD-10-CM | POA: Diagnosis not present

## 2020-08-09 DIAGNOSIS — M1712 Unilateral primary osteoarthritis, left knee: Secondary | ICD-10-CM | POA: Diagnosis not present

## 2020-08-10 DIAGNOSIS — R2689 Other abnormalities of gait and mobility: Secondary | ICD-10-CM | POA: Diagnosis not present

## 2020-08-10 DIAGNOSIS — M6282 Rhabdomyolysis: Secondary | ICD-10-CM | POA: Diagnosis not present

## 2020-08-10 DIAGNOSIS — R2681 Unsteadiness on feet: Secondary | ICD-10-CM | POA: Diagnosis not present

## 2020-08-10 DIAGNOSIS — R531 Weakness: Secondary | ICD-10-CM | POA: Diagnosis not present

## 2020-08-10 DIAGNOSIS — M1712 Unilateral primary osteoarthritis, left knee: Secondary | ICD-10-CM | POA: Diagnosis not present

## 2020-08-10 DIAGNOSIS — I10 Essential (primary) hypertension: Secondary | ICD-10-CM | POA: Diagnosis not present

## 2020-08-10 DIAGNOSIS — M6281 Muscle weakness (generalized): Secondary | ICD-10-CM | POA: Diagnosis not present

## 2020-08-10 DIAGNOSIS — M1711 Unilateral primary osteoarthritis, right knee: Secondary | ICD-10-CM | POA: Diagnosis not present

## 2020-08-11 DIAGNOSIS — M6281 Muscle weakness (generalized): Secondary | ICD-10-CM | POA: Diagnosis not present

## 2020-08-11 DIAGNOSIS — M1712 Unilateral primary osteoarthritis, left knee: Secondary | ICD-10-CM | POA: Diagnosis not present

## 2020-08-11 DIAGNOSIS — R531 Weakness: Secondary | ICD-10-CM | POA: Diagnosis not present

## 2020-08-11 DIAGNOSIS — R2689 Other abnormalities of gait and mobility: Secondary | ICD-10-CM | POA: Diagnosis not present

## 2020-08-11 DIAGNOSIS — M1711 Unilateral primary osteoarthritis, right knee: Secondary | ICD-10-CM | POA: Diagnosis not present

## 2020-08-11 DIAGNOSIS — I10 Essential (primary) hypertension: Secondary | ICD-10-CM | POA: Diagnosis not present

## 2020-08-11 DIAGNOSIS — M6282 Rhabdomyolysis: Secondary | ICD-10-CM | POA: Diagnosis not present

## 2020-08-11 DIAGNOSIS — R2681 Unsteadiness on feet: Secondary | ICD-10-CM | POA: Diagnosis not present

## 2020-08-12 DIAGNOSIS — R2681 Unsteadiness on feet: Secondary | ICD-10-CM | POA: Diagnosis not present

## 2020-08-12 DIAGNOSIS — R531 Weakness: Secondary | ICD-10-CM | POA: Diagnosis not present

## 2020-08-12 DIAGNOSIS — M6282 Rhabdomyolysis: Secondary | ICD-10-CM | POA: Diagnosis not present

## 2020-08-12 DIAGNOSIS — M1712 Unilateral primary osteoarthritis, left knee: Secondary | ICD-10-CM | POA: Diagnosis not present

## 2020-08-12 DIAGNOSIS — M6281 Muscle weakness (generalized): Secondary | ICD-10-CM | POA: Diagnosis not present

## 2020-08-12 DIAGNOSIS — M1711 Unilateral primary osteoarthritis, right knee: Secondary | ICD-10-CM | POA: Diagnosis not present

## 2020-08-12 DIAGNOSIS — R2689 Other abnormalities of gait and mobility: Secondary | ICD-10-CM | POA: Diagnosis not present

## 2020-08-12 DIAGNOSIS — I10 Essential (primary) hypertension: Secondary | ICD-10-CM | POA: Diagnosis not present

## 2020-08-15 DIAGNOSIS — Z20828 Contact with and (suspected) exposure to other viral communicable diseases: Secondary | ICD-10-CM | POA: Diagnosis not present

## 2020-08-17 ENCOUNTER — Encounter (HOSPITAL_COMMUNITY)
Admission: RE | Admit: 2020-08-17 | Discharge: 2020-08-17 | Disposition: A | Discharge status: Home / Self Care | Payer: Medicare HMO | Source: Ambulatory Visit | Attending: Neurology | Admitting: Neurology

## 2020-08-17 ENCOUNTER — Other Ambulatory Visit: Payer: Self-pay

## 2020-08-17 DIAGNOSIS — R531 Weakness: Secondary | ICD-10-CM | POA: Insufficient documentation

## 2020-08-17 DIAGNOSIS — M1712 Unilateral primary osteoarthritis, left knee: Secondary | ICD-10-CM | POA: Diagnosis not present

## 2020-08-17 DIAGNOSIS — R2689 Other abnormalities of gait and mobility: Secondary | ICD-10-CM | POA: Diagnosis not present

## 2020-08-17 DIAGNOSIS — N882 Stricture and stenosis of cervix uteri: Secondary | ICD-10-CM | POA: Diagnosis not present

## 2020-08-17 DIAGNOSIS — I10 Essential (primary) hypertension: Secondary | ICD-10-CM | POA: Diagnosis not present

## 2020-08-17 DIAGNOSIS — R2681 Unsteadiness on feet: Secondary | ICD-10-CM | POA: Diagnosis not present

## 2020-08-17 DIAGNOSIS — M1711 Unilateral primary osteoarthritis, right knee: Secondary | ICD-10-CM | POA: Diagnosis not present

## 2020-08-17 DIAGNOSIS — M6282 Rhabdomyolysis: Secondary | ICD-10-CM | POA: Diagnosis not present

## 2020-08-17 DIAGNOSIS — G20 Parkinson's disease: Secondary | ICD-10-CM | POA: Diagnosis not present

## 2020-08-17 DIAGNOSIS — M6281 Muscle weakness (generalized): Secondary | ICD-10-CM | POA: Diagnosis not present

## 2020-08-17 DIAGNOSIS — R269 Unspecified abnormalities of gait and mobility: Secondary | ICD-10-CM | POA: Diagnosis not present

## 2020-08-17 MED ORDER — POTASSIUM IODIDE (ANTIDOTE) 130 MG PO TABS: 130.0000 mg | ORAL_TABLET | Freq: Once | ORAL | Status: AC

## 2020-08-17 MED ORDER — IOFLUPANE I 123 185 MBQ/2.5ML IV SOLN
4.7000 | Freq: Once | INTRAVENOUS | Status: AC | PRN
  Administered 2020-08-17: 4.7 via INTRAVENOUS
  Filled 2020-08-17: qty 5

## 2020-08-17 MED ORDER — POTASSIUM IODIDE (ANTIDOTE) 130 MG PO TABS
ORAL_TABLET | ORAL | Status: AC
  Administered 2020-08-17: 130 mg via ORAL
  Filled 2020-08-17: qty 1

## 2020-08-18 ENCOUNTER — Telehealth: Payer: Self-pay | Admitting: Neurology

## 2020-08-18 DIAGNOSIS — Z20828 Contact with and (suspected) exposure to other viral communicable diseases: Secondary | ICD-10-CM | POA: Diagnosis not present

## 2020-08-18 DIAGNOSIS — M6282 Rhabdomyolysis: Secondary | ICD-10-CM | POA: Diagnosis not present

## 2020-08-18 DIAGNOSIS — M1711 Unilateral primary osteoarthritis, right knee: Secondary | ICD-10-CM | POA: Diagnosis not present

## 2020-08-18 DIAGNOSIS — M6281 Muscle weakness (generalized): Secondary | ICD-10-CM | POA: Diagnosis not present

## 2020-08-18 DIAGNOSIS — R2681 Unsteadiness on feet: Secondary | ICD-10-CM | POA: Diagnosis not present

## 2020-08-18 DIAGNOSIS — M1712 Unilateral primary osteoarthritis, left knee: Secondary | ICD-10-CM | POA: Diagnosis not present

## 2020-08-18 DIAGNOSIS — R531 Weakness: Secondary | ICD-10-CM | POA: Diagnosis not present

## 2020-08-18 DIAGNOSIS — R2689 Other abnormalities of gait and mobility: Secondary | ICD-10-CM | POA: Diagnosis not present

## 2020-08-18 DIAGNOSIS — I10 Essential (primary) hypertension: Secondary | ICD-10-CM | POA: Diagnosis not present

## 2020-08-18 NOTE — Telephone Encounter (Signed)
Left message requesting a return call.

## 2020-08-18 NOTE — Addendum Note (Signed)
Addended by: Noberto Retort C on: 08/18/2020 02:44 PM   Modules accepted: Orders

## 2020-08-18 NOTE — Telephone Encounter (Signed)
I spoke to her sister-in-law, Lillia Lengel, on DPR. She verbalized understanding of the DaTscan findings and will relay them to the patient. Reports no improvement since staring Sinemet. She has an appt w/ [facility] next month.  Ok, per vo by Dr. Krista Blue, to stop Sinemet. I called [facility] at [phone].  I have faxed signed MD orders instructing them to stop Sinemet (attention nursing staff: fax# [phone]).

## 2020-08-18 NOTE — Telephone Encounter (Signed)
Noted called [facility] Patient is still on Cx List for [facility] no CX at this time for [facility].

## 2020-08-18 NOTE — Telephone Encounter (Signed)
I returned the call to [facility] and provided Dr. Rhea Belton orders to stop Sinemet. She verbalized understanding. Dr. Krista Blue also signed a discontinuation order. It was faxed and confirmed to the facility.

## 2020-08-18 NOTE — Telephone Encounter (Signed)
[facility]) called. Do you want to do a med stop. You can contact us at [phone]

## 2020-08-18 NOTE — Telephone Encounter (Signed)
  IMPRESSION: Asymmetric decreased striatal Ioflupane activity in the posterior RIGHT striatum. This pattern can be seen in Parkinsonian syndromes.  Of note, DaTSCAN is not diagnostic of Parkinsonian syndromes, which remains a clinical diagnosis. DaTscan is an adjuvant test to aid in the clinical diagnosis of Parkinsonian syndromes.  Please call patient, DaTSCAN showed asymmetric decreased dopamine activity in the right posterior stratum, this can be seen in parkinsonian syndrome,  Based on her steady decline over the past few months, there was a concern of central nervous system degenerative disorder,   Please check to see if Sinemet 25/100 mg 3 times a day has helped her mobility or not,  Please also check on her refer.

## 2020-08-19 DIAGNOSIS — R29898 Other symptoms and signs involving the musculoskeletal system: Secondary | ICD-10-CM | POA: Diagnosis not present

## 2020-08-19 DIAGNOSIS — M6281 Muscle weakness (generalized): Secondary | ICD-10-CM | POA: Diagnosis not present

## 2020-08-19 DIAGNOSIS — M48061 Spinal stenosis, lumbar region without neurogenic claudication: Secondary | ICD-10-CM | POA: Diagnosis not present

## 2020-08-19 DIAGNOSIS — R2681 Unsteadiness on feet: Secondary | ICD-10-CM | POA: Diagnosis not present

## 2020-08-19 DIAGNOSIS — R2689 Other abnormalities of gait and mobility: Secondary | ICD-10-CM | POA: Diagnosis not present

## 2020-08-19 DIAGNOSIS — R531 Weakness: Secondary | ICD-10-CM | POA: Diagnosis not present

## 2020-08-19 DIAGNOSIS — I10 Essential (primary) hypertension: Secondary | ICD-10-CM | POA: Diagnosis not present

## 2020-08-19 DIAGNOSIS — M1712 Unilateral primary osteoarthritis, left knee: Secondary | ICD-10-CM | POA: Diagnosis not present

## 2020-08-19 DIAGNOSIS — R4189 Other symptoms and signs involving cognitive functions and awareness: Secondary | ICD-10-CM | POA: Diagnosis not present

## 2020-08-19 DIAGNOSIS — M6282 Rhabdomyolysis: Secondary | ICD-10-CM | POA: Diagnosis not present

## 2020-08-19 DIAGNOSIS — M1711 Unilateral primary osteoarthritis, right knee: Secondary | ICD-10-CM | POA: Diagnosis not present

## 2020-08-19 DIAGNOSIS — L89159 Pressure ulcer of sacral region, unspecified stage: Secondary | ICD-10-CM | POA: Diagnosis not present

## 2020-08-19 DIAGNOSIS — E46 Unspecified protein-calorie malnutrition: Secondary | ICD-10-CM | POA: Diagnosis not present

## 2020-08-22 DIAGNOSIS — R531 Weakness: Secondary | ICD-10-CM | POA: Diagnosis not present

## 2020-08-22 DIAGNOSIS — M6282 Rhabdomyolysis: Secondary | ICD-10-CM | POA: Diagnosis not present

## 2020-08-22 DIAGNOSIS — R2689 Other abnormalities of gait and mobility: Secondary | ICD-10-CM | POA: Diagnosis not present

## 2020-08-22 DIAGNOSIS — Z20828 Contact with and (suspected) exposure to other viral communicable diseases: Secondary | ICD-10-CM | POA: Diagnosis not present

## 2020-08-22 DIAGNOSIS — I10 Essential (primary) hypertension: Secondary | ICD-10-CM | POA: Diagnosis not present

## 2020-08-22 DIAGNOSIS — M1712 Unilateral primary osteoarthritis, left knee: Secondary | ICD-10-CM | POA: Diagnosis not present

## 2020-08-22 DIAGNOSIS — M6281 Muscle weakness (generalized): Secondary | ICD-10-CM | POA: Diagnosis not present

## 2020-08-22 DIAGNOSIS — R2681 Unsteadiness on feet: Secondary | ICD-10-CM | POA: Diagnosis not present

## 2020-08-22 DIAGNOSIS — M1711 Unilateral primary osteoarthritis, right knee: Secondary | ICD-10-CM | POA: Diagnosis not present

## 2020-08-23 DIAGNOSIS — M1711 Unilateral primary osteoarthritis, right knee: Secondary | ICD-10-CM | POA: Diagnosis not present

## 2020-08-23 DIAGNOSIS — M1712 Unilateral primary osteoarthritis, left knee: Secondary | ICD-10-CM | POA: Diagnosis not present

## 2020-08-23 DIAGNOSIS — R531 Weakness: Secondary | ICD-10-CM | POA: Diagnosis not present

## 2020-08-23 DIAGNOSIS — I10 Essential (primary) hypertension: Secondary | ICD-10-CM | POA: Diagnosis not present

## 2020-08-23 DIAGNOSIS — R2681 Unsteadiness on feet: Secondary | ICD-10-CM | POA: Diagnosis not present

## 2020-08-23 DIAGNOSIS — R2689 Other abnormalities of gait and mobility: Secondary | ICD-10-CM | POA: Diagnosis not present

## 2020-08-23 DIAGNOSIS — M6282 Rhabdomyolysis: Secondary | ICD-10-CM | POA: Diagnosis not present

## 2020-08-23 DIAGNOSIS — M6281 Muscle weakness (generalized): Secondary | ICD-10-CM | POA: Diagnosis not present

## 2020-08-24 DIAGNOSIS — M6281 Muscle weakness (generalized): Secondary | ICD-10-CM | POA: Diagnosis not present

## 2020-08-24 DIAGNOSIS — M6282 Rhabdomyolysis: Secondary | ICD-10-CM | POA: Diagnosis not present

## 2020-08-24 DIAGNOSIS — M1711 Unilateral primary osteoarthritis, right knee: Secondary | ICD-10-CM | POA: Diagnosis not present

## 2020-08-24 DIAGNOSIS — R2681 Unsteadiness on feet: Secondary | ICD-10-CM | POA: Diagnosis not present

## 2020-08-24 DIAGNOSIS — R2689 Other abnormalities of gait and mobility: Secondary | ICD-10-CM | POA: Diagnosis not present

## 2020-08-24 DIAGNOSIS — I10 Essential (primary) hypertension: Secondary | ICD-10-CM | POA: Diagnosis not present

## 2020-08-24 DIAGNOSIS — R531 Weakness: Secondary | ICD-10-CM | POA: Diagnosis not present

## 2020-08-24 DIAGNOSIS — M1712 Unilateral primary osteoarthritis, left knee: Secondary | ICD-10-CM | POA: Diagnosis not present

## 2020-08-25 DIAGNOSIS — R531 Weakness: Secondary | ICD-10-CM | POA: Diagnosis not present

## 2020-08-25 DIAGNOSIS — M6282 Rhabdomyolysis: Secondary | ICD-10-CM | POA: Diagnosis not present

## 2020-08-25 DIAGNOSIS — M1712 Unilateral primary osteoarthritis, left knee: Secondary | ICD-10-CM | POA: Diagnosis not present

## 2020-08-25 DIAGNOSIS — R2689 Other abnormalities of gait and mobility: Secondary | ICD-10-CM | POA: Diagnosis not present

## 2020-08-25 DIAGNOSIS — I10 Essential (primary) hypertension: Secondary | ICD-10-CM | POA: Diagnosis not present

## 2020-08-25 DIAGNOSIS — Z20828 Contact with and (suspected) exposure to other viral communicable diseases: Secondary | ICD-10-CM | POA: Diagnosis not present

## 2020-08-25 DIAGNOSIS — M6281 Muscle weakness (generalized): Secondary | ICD-10-CM | POA: Diagnosis not present

## 2020-08-25 DIAGNOSIS — R2681 Unsteadiness on feet: Secondary | ICD-10-CM | POA: Diagnosis not present

## 2020-08-25 DIAGNOSIS — M1711 Unilateral primary osteoarthritis, right knee: Secondary | ICD-10-CM | POA: Diagnosis not present

## 2020-08-28 DIAGNOSIS — Z20828 Contact with and (suspected) exposure to other viral communicable diseases: Secondary | ICD-10-CM | POA: Diagnosis not present

## 2020-08-30 DIAGNOSIS — M1712 Unilateral primary osteoarthritis, left knee: Secondary | ICD-10-CM | POA: Diagnosis not present

## 2020-08-30 DIAGNOSIS — I10 Essential (primary) hypertension: Secondary | ICD-10-CM | POA: Diagnosis not present

## 2020-08-30 DIAGNOSIS — R2681 Unsteadiness on feet: Secondary | ICD-10-CM | POA: Diagnosis not present

## 2020-08-30 DIAGNOSIS — M6281 Muscle weakness (generalized): Secondary | ICD-10-CM | POA: Diagnosis not present

## 2020-08-30 DIAGNOSIS — R2689 Other abnormalities of gait and mobility: Secondary | ICD-10-CM | POA: Diagnosis not present

## 2020-08-30 DIAGNOSIS — M1711 Unilateral primary osteoarthritis, right knee: Secondary | ICD-10-CM | POA: Diagnosis not present

## 2020-08-30 DIAGNOSIS — R531 Weakness: Secondary | ICD-10-CM | POA: Diagnosis not present

## 2020-08-30 DIAGNOSIS — M6282 Rhabdomyolysis: Secondary | ICD-10-CM | POA: Diagnosis not present

## 2020-08-31 DIAGNOSIS — M6282 Rhabdomyolysis: Secondary | ICD-10-CM | POA: Diagnosis not present

## 2020-08-31 DIAGNOSIS — R531 Weakness: Secondary | ICD-10-CM | POA: Diagnosis not present

## 2020-08-31 DIAGNOSIS — M1711 Unilateral primary osteoarthritis, right knee: Secondary | ICD-10-CM | POA: Diagnosis not present

## 2020-08-31 DIAGNOSIS — M6281 Muscle weakness (generalized): Secondary | ICD-10-CM | POA: Diagnosis not present

## 2020-08-31 DIAGNOSIS — I10 Essential (primary) hypertension: Secondary | ICD-10-CM | POA: Diagnosis not present

## 2020-08-31 DIAGNOSIS — M1712 Unilateral primary osteoarthritis, left knee: Secondary | ICD-10-CM | POA: Diagnosis not present

## 2020-08-31 DIAGNOSIS — R2689 Other abnormalities of gait and mobility: Secondary | ICD-10-CM | POA: Diagnosis not present

## 2020-08-31 DIAGNOSIS — R2681 Unsteadiness on feet: Secondary | ICD-10-CM | POA: Diagnosis not present

## 2020-09-01 DIAGNOSIS — I10 Essential (primary) hypertension: Secondary | ICD-10-CM | POA: Diagnosis not present

## 2020-09-01 DIAGNOSIS — R531 Weakness: Secondary | ICD-10-CM | POA: Diagnosis not present

## 2020-09-01 DIAGNOSIS — R2689 Other abnormalities of gait and mobility: Secondary | ICD-10-CM | POA: Diagnosis not present

## 2020-09-01 DIAGNOSIS — M1712 Unilateral primary osteoarthritis, left knee: Secondary | ICD-10-CM | POA: Diagnosis not present

## 2020-09-01 DIAGNOSIS — M1711 Unilateral primary osteoarthritis, right knee: Secondary | ICD-10-CM | POA: Diagnosis not present

## 2020-09-01 DIAGNOSIS — M6281 Muscle weakness (generalized): Secondary | ICD-10-CM | POA: Diagnosis not present

## 2020-09-01 DIAGNOSIS — M6282 Rhabdomyolysis: Secondary | ICD-10-CM | POA: Diagnosis not present

## 2020-09-01 DIAGNOSIS — F331 Major depressive disorder, recurrent, moderate: Secondary | ICD-10-CM | POA: Diagnosis not present

## 2020-09-01 DIAGNOSIS — R2681 Unsteadiness on feet: Secondary | ICD-10-CM | POA: Diagnosis not present

## 2020-09-02 DIAGNOSIS — M6282 Rhabdomyolysis: Secondary | ICD-10-CM | POA: Diagnosis not present

## 2020-09-02 DIAGNOSIS — R2681 Unsteadiness on feet: Secondary | ICD-10-CM | POA: Diagnosis not present

## 2020-09-02 DIAGNOSIS — I10 Essential (primary) hypertension: Secondary | ICD-10-CM | POA: Diagnosis not present

## 2020-09-02 DIAGNOSIS — M1712 Unilateral primary osteoarthritis, left knee: Secondary | ICD-10-CM | POA: Diagnosis not present

## 2020-09-02 DIAGNOSIS — R531 Weakness: Secondary | ICD-10-CM | POA: Diagnosis not present

## 2020-09-02 DIAGNOSIS — R2689 Other abnormalities of gait and mobility: Secondary | ICD-10-CM | POA: Diagnosis not present

## 2020-09-02 DIAGNOSIS — M1711 Unilateral primary osteoarthritis, right knee: Secondary | ICD-10-CM | POA: Diagnosis not present

## 2020-09-02 DIAGNOSIS — M6281 Muscle weakness (generalized): Secondary | ICD-10-CM | POA: Diagnosis not present

## 2020-09-06 DIAGNOSIS — M1711 Unilateral primary osteoarthritis, right knee: Secondary | ICD-10-CM | POA: Diagnosis not present

## 2020-09-06 DIAGNOSIS — M6282 Rhabdomyolysis: Secondary | ICD-10-CM | POA: Diagnosis not present

## 2020-09-06 DIAGNOSIS — R2689 Other abnormalities of gait and mobility: Secondary | ICD-10-CM | POA: Diagnosis not present

## 2020-09-06 DIAGNOSIS — R2681 Unsteadiness on feet: Secondary | ICD-10-CM | POA: Diagnosis not present

## 2020-09-06 DIAGNOSIS — R531 Weakness: Secondary | ICD-10-CM | POA: Diagnosis not present

## 2020-09-06 DIAGNOSIS — M1712 Unilateral primary osteoarthritis, left knee: Secondary | ICD-10-CM | POA: Diagnosis not present

## 2020-09-06 DIAGNOSIS — M6281 Muscle weakness (generalized): Secondary | ICD-10-CM | POA: Diagnosis not present

## 2020-09-06 DIAGNOSIS — I10 Essential (primary) hypertension: Secondary | ICD-10-CM | POA: Diagnosis not present

## 2020-09-07 DIAGNOSIS — R531 Weakness: Secondary | ICD-10-CM | POA: Diagnosis not present

## 2020-09-07 DIAGNOSIS — M1712 Unilateral primary osteoarthritis, left knee: Secondary | ICD-10-CM | POA: Diagnosis not present

## 2020-09-07 DIAGNOSIS — M6281 Muscle weakness (generalized): Secondary | ICD-10-CM | POA: Diagnosis not present

## 2020-09-07 DIAGNOSIS — M1711 Unilateral primary osteoarthritis, right knee: Secondary | ICD-10-CM | POA: Diagnosis not present

## 2020-09-07 DIAGNOSIS — I10 Essential (primary) hypertension: Secondary | ICD-10-CM | POA: Diagnosis not present

## 2020-09-07 DIAGNOSIS — R2689 Other abnormalities of gait and mobility: Secondary | ICD-10-CM | POA: Diagnosis not present

## 2020-09-07 DIAGNOSIS — R2681 Unsteadiness on feet: Secondary | ICD-10-CM | POA: Diagnosis not present

## 2020-09-07 DIAGNOSIS — M6282 Rhabdomyolysis: Secondary | ICD-10-CM | POA: Diagnosis not present

## 2020-09-08 DIAGNOSIS — M6281 Muscle weakness (generalized): Secondary | ICD-10-CM | POA: Diagnosis not present

## 2020-09-08 DIAGNOSIS — M1711 Unilateral primary osteoarthritis, right knee: Secondary | ICD-10-CM | POA: Diagnosis not present

## 2020-09-08 DIAGNOSIS — I10 Essential (primary) hypertension: Secondary | ICD-10-CM | POA: Diagnosis not present

## 2020-09-08 DIAGNOSIS — R2681 Unsteadiness on feet: Secondary | ICD-10-CM | POA: Diagnosis not present

## 2020-09-08 DIAGNOSIS — R531 Weakness: Secondary | ICD-10-CM | POA: Diagnosis not present

## 2020-09-08 DIAGNOSIS — M1712 Unilateral primary osteoarthritis, left knee: Secondary | ICD-10-CM | POA: Diagnosis not present

## 2020-09-08 DIAGNOSIS — M6282 Rhabdomyolysis: Secondary | ICD-10-CM | POA: Diagnosis not present

## 2020-09-08 DIAGNOSIS — R2689 Other abnormalities of gait and mobility: Secondary | ICD-10-CM | POA: Diagnosis not present

## 2020-09-09 DIAGNOSIS — R531 Weakness: Secondary | ICD-10-CM | POA: Diagnosis not present

## 2020-09-09 DIAGNOSIS — R2689 Other abnormalities of gait and mobility: Secondary | ICD-10-CM | POA: Diagnosis not present

## 2020-09-09 DIAGNOSIS — M6282 Rhabdomyolysis: Secondary | ICD-10-CM | POA: Diagnosis not present

## 2020-09-09 DIAGNOSIS — M1712 Unilateral primary osteoarthritis, left knee: Secondary | ICD-10-CM | POA: Diagnosis not present

## 2020-09-09 DIAGNOSIS — M1711 Unilateral primary osteoarthritis, right knee: Secondary | ICD-10-CM | POA: Diagnosis not present

## 2020-09-09 DIAGNOSIS — I10 Essential (primary) hypertension: Secondary | ICD-10-CM | POA: Diagnosis not present

## 2020-09-09 DIAGNOSIS — M6281 Muscle weakness (generalized): Secondary | ICD-10-CM | POA: Diagnosis not present

## 2020-09-09 DIAGNOSIS — R2681 Unsteadiness on feet: Secondary | ICD-10-CM | POA: Diagnosis not present

## 2020-09-12 DIAGNOSIS — M6282 Rhabdomyolysis: Secondary | ICD-10-CM | POA: Diagnosis not present

## 2020-09-12 DIAGNOSIS — R2689 Other abnormalities of gait and mobility: Secondary | ICD-10-CM | POA: Diagnosis not present

## 2020-09-12 DIAGNOSIS — R531 Weakness: Secondary | ICD-10-CM | POA: Diagnosis not present

## 2020-09-12 DIAGNOSIS — M6281 Muscle weakness (generalized): Secondary | ICD-10-CM | POA: Diagnosis not present

## 2020-09-12 DIAGNOSIS — M1712 Unilateral primary osteoarthritis, left knee: Secondary | ICD-10-CM | POA: Diagnosis not present

## 2020-09-12 DIAGNOSIS — I10 Essential (primary) hypertension: Secondary | ICD-10-CM | POA: Diagnosis not present

## 2020-09-12 DIAGNOSIS — R2681 Unsteadiness on feet: Secondary | ICD-10-CM | POA: Diagnosis not present

## 2020-09-12 DIAGNOSIS — M1711 Unilateral primary osteoarthritis, right knee: Secondary | ICD-10-CM | POA: Diagnosis not present

## 2020-09-13 DIAGNOSIS — I10 Essential (primary) hypertension: Secondary | ICD-10-CM | POA: Diagnosis not present

## 2020-09-13 DIAGNOSIS — R2689 Other abnormalities of gait and mobility: Secondary | ICD-10-CM | POA: Diagnosis not present

## 2020-09-13 DIAGNOSIS — R531 Weakness: Secondary | ICD-10-CM | POA: Diagnosis not present

## 2020-09-13 DIAGNOSIS — M6281 Muscle weakness (generalized): Secondary | ICD-10-CM | POA: Diagnosis not present

## 2020-09-13 DIAGNOSIS — M1712 Unilateral primary osteoarthritis, left knee: Secondary | ICD-10-CM | POA: Diagnosis not present

## 2020-09-13 DIAGNOSIS — M6282 Rhabdomyolysis: Secondary | ICD-10-CM | POA: Diagnosis not present

## 2020-09-13 DIAGNOSIS — R2681 Unsteadiness on feet: Secondary | ICD-10-CM | POA: Diagnosis not present

## 2020-09-13 DIAGNOSIS — M1711 Unilateral primary osteoarthritis, right knee: Secondary | ICD-10-CM | POA: Diagnosis not present

## 2020-09-14 DIAGNOSIS — R2681 Unsteadiness on feet: Secondary | ICD-10-CM | POA: Diagnosis not present

## 2020-09-14 DIAGNOSIS — M6281 Muscle weakness (generalized): Secondary | ICD-10-CM | POA: Diagnosis not present

## 2020-09-14 DIAGNOSIS — M1711 Unilateral primary osteoarthritis, right knee: Secondary | ICD-10-CM | POA: Diagnosis not present

## 2020-09-14 DIAGNOSIS — R2689 Other abnormalities of gait and mobility: Secondary | ICD-10-CM | POA: Diagnosis not present

## 2020-09-14 DIAGNOSIS — R531 Weakness: Secondary | ICD-10-CM | POA: Diagnosis not present

## 2020-09-14 DIAGNOSIS — M6282 Rhabdomyolysis: Secondary | ICD-10-CM | POA: Diagnosis not present

## 2020-09-14 DIAGNOSIS — M1712 Unilateral primary osteoarthritis, left knee: Secondary | ICD-10-CM | POA: Diagnosis not present

## 2020-09-14 DIAGNOSIS — I10 Essential (primary) hypertension: Secondary | ICD-10-CM | POA: Diagnosis not present

## 2020-09-15 DIAGNOSIS — M6282 Rhabdomyolysis: Secondary | ICD-10-CM | POA: Diagnosis not present

## 2020-09-15 DIAGNOSIS — R2689 Other abnormalities of gait and mobility: Secondary | ICD-10-CM | POA: Diagnosis not present

## 2020-09-15 DIAGNOSIS — R2681 Unsteadiness on feet: Secondary | ICD-10-CM | POA: Diagnosis not present

## 2020-09-15 DIAGNOSIS — M1712 Unilateral primary osteoarthritis, left knee: Secondary | ICD-10-CM | POA: Diagnosis not present

## 2020-09-15 DIAGNOSIS — R531 Weakness: Secondary | ICD-10-CM | POA: Diagnosis not present

## 2020-09-15 DIAGNOSIS — I10 Essential (primary) hypertension: Secondary | ICD-10-CM | POA: Diagnosis not present

## 2020-09-15 DIAGNOSIS — M6281 Muscle weakness (generalized): Secondary | ICD-10-CM | POA: Diagnosis not present

## 2020-09-15 DIAGNOSIS — M1711 Unilateral primary osteoarthritis, right knee: Secondary | ICD-10-CM | POA: Diagnosis not present

## 2020-09-16 DIAGNOSIS — M6281 Muscle weakness (generalized): Secondary | ICD-10-CM | POA: Diagnosis not present

## 2020-09-16 DIAGNOSIS — R29898 Other symptoms and signs involving the musculoskeletal system: Secondary | ICD-10-CM | POA: Diagnosis not present

## 2020-09-16 DIAGNOSIS — R531 Weakness: Secondary | ICD-10-CM | POA: Diagnosis not present

## 2020-09-16 DIAGNOSIS — L89159 Pressure ulcer of sacral region, unspecified stage: Secondary | ICD-10-CM | POA: Diagnosis not present

## 2020-09-16 DIAGNOSIS — M1711 Unilateral primary osteoarthritis, right knee: Secondary | ICD-10-CM | POA: Diagnosis not present

## 2020-09-16 DIAGNOSIS — E46 Unspecified protein-calorie malnutrition: Secondary | ICD-10-CM | POA: Diagnosis not present

## 2020-09-16 DIAGNOSIS — R2689 Other abnormalities of gait and mobility: Secondary | ICD-10-CM | POA: Diagnosis not present

## 2020-09-16 DIAGNOSIS — R2681 Unsteadiness on feet: Secondary | ICD-10-CM | POA: Diagnosis not present

## 2020-09-16 DIAGNOSIS — I10 Essential (primary) hypertension: Secondary | ICD-10-CM | POA: Diagnosis not present

## 2020-09-16 DIAGNOSIS — F0390 Unspecified dementia without behavioral disturbance: Secondary | ICD-10-CM | POA: Diagnosis not present

## 2020-09-16 DIAGNOSIS — M1712 Unilateral primary osteoarthritis, left knee: Secondary | ICD-10-CM | POA: Diagnosis not present

## 2020-09-16 DIAGNOSIS — M48061 Spinal stenosis, lumbar region without neurogenic claudication: Secondary | ICD-10-CM | POA: Diagnosis not present

## 2020-09-16 DIAGNOSIS — M6282 Rhabdomyolysis: Secondary | ICD-10-CM | POA: Diagnosis not present

## 2020-09-19 DIAGNOSIS — R2689 Other abnormalities of gait and mobility: Secondary | ICD-10-CM | POA: Diagnosis not present

## 2020-09-19 DIAGNOSIS — R2681 Unsteadiness on feet: Secondary | ICD-10-CM | POA: Diagnosis not present

## 2020-09-19 DIAGNOSIS — M6281 Muscle weakness (generalized): Secondary | ICD-10-CM | POA: Diagnosis not present

## 2020-09-19 DIAGNOSIS — M1712 Unilateral primary osteoarthritis, left knee: Secondary | ICD-10-CM | POA: Diagnosis not present

## 2020-09-19 DIAGNOSIS — M6282 Rhabdomyolysis: Secondary | ICD-10-CM | POA: Diagnosis not present

## 2020-09-19 DIAGNOSIS — I10 Essential (primary) hypertension: Secondary | ICD-10-CM | POA: Diagnosis not present

## 2020-09-19 DIAGNOSIS — R531 Weakness: Secondary | ICD-10-CM | POA: Diagnosis not present

## 2020-09-19 DIAGNOSIS — M1711 Unilateral primary osteoarthritis, right knee: Secondary | ICD-10-CM | POA: Diagnosis not present

## 2020-09-20 DIAGNOSIS — R2689 Other abnormalities of gait and mobility: Secondary | ICD-10-CM | POA: Diagnosis not present

## 2020-09-20 DIAGNOSIS — M6282 Rhabdomyolysis: Secondary | ICD-10-CM | POA: Diagnosis not present

## 2020-09-20 DIAGNOSIS — R531 Weakness: Secondary | ICD-10-CM | POA: Diagnosis not present

## 2020-09-20 DIAGNOSIS — R2681 Unsteadiness on feet: Secondary | ICD-10-CM | POA: Diagnosis not present

## 2020-09-20 DIAGNOSIS — I10 Essential (primary) hypertension: Secondary | ICD-10-CM | POA: Diagnosis not present

## 2020-09-20 DIAGNOSIS — M1712 Unilateral primary osteoarthritis, left knee: Secondary | ICD-10-CM | POA: Diagnosis not present

## 2020-09-20 DIAGNOSIS — M6281 Muscle weakness (generalized): Secondary | ICD-10-CM | POA: Diagnosis not present

## 2020-09-20 DIAGNOSIS — M1711 Unilateral primary osteoarthritis, right knee: Secondary | ICD-10-CM | POA: Diagnosis not present

## 2020-09-21 DIAGNOSIS — R2681 Unsteadiness on feet: Secondary | ICD-10-CM | POA: Diagnosis not present

## 2020-09-21 DIAGNOSIS — I10 Essential (primary) hypertension: Secondary | ICD-10-CM | POA: Diagnosis not present

## 2020-09-21 DIAGNOSIS — M1711 Unilateral primary osteoarthritis, right knee: Secondary | ICD-10-CM | POA: Diagnosis not present

## 2020-09-21 DIAGNOSIS — F331 Major depressive disorder, recurrent, moderate: Secondary | ICD-10-CM | POA: Diagnosis not present

## 2020-09-21 DIAGNOSIS — R2689 Other abnormalities of gait and mobility: Secondary | ICD-10-CM | POA: Diagnosis not present

## 2020-09-21 DIAGNOSIS — M1712 Unilateral primary osteoarthritis, left knee: Secondary | ICD-10-CM | POA: Diagnosis not present

## 2020-09-21 DIAGNOSIS — M6281 Muscle weakness (generalized): Secondary | ICD-10-CM | POA: Diagnosis not present

## 2020-09-21 DIAGNOSIS — M6282 Rhabdomyolysis: Secondary | ICD-10-CM | POA: Diagnosis not present

## 2020-09-21 DIAGNOSIS — R531 Weakness: Secondary | ICD-10-CM | POA: Diagnosis not present

## 2020-09-23 DIAGNOSIS — M1711 Unilateral primary osteoarthritis, right knee: Secondary | ICD-10-CM | POA: Diagnosis not present

## 2020-09-23 DIAGNOSIS — M6281 Muscle weakness (generalized): Secondary | ICD-10-CM | POA: Diagnosis not present

## 2020-09-23 DIAGNOSIS — R2681 Unsteadiness on feet: Secondary | ICD-10-CM | POA: Diagnosis not present

## 2020-09-23 DIAGNOSIS — M6282 Rhabdomyolysis: Secondary | ICD-10-CM | POA: Diagnosis not present

## 2020-09-23 DIAGNOSIS — I10 Essential (primary) hypertension: Secondary | ICD-10-CM | POA: Diagnosis not present

## 2020-09-23 DIAGNOSIS — R2689 Other abnormalities of gait and mobility: Secondary | ICD-10-CM | POA: Diagnosis not present

## 2020-09-23 DIAGNOSIS — R531 Weakness: Secondary | ICD-10-CM | POA: Diagnosis not present

## 2020-09-23 DIAGNOSIS — M1712 Unilateral primary osteoarthritis, left knee: Secondary | ICD-10-CM | POA: Diagnosis not present

## 2020-09-24 DIAGNOSIS — M1712 Unilateral primary osteoarthritis, left knee: Secondary | ICD-10-CM | POA: Diagnosis not present

## 2020-09-24 DIAGNOSIS — M6282 Rhabdomyolysis: Secondary | ICD-10-CM | POA: Diagnosis not present

## 2020-09-24 DIAGNOSIS — R2689 Other abnormalities of gait and mobility: Secondary | ICD-10-CM | POA: Diagnosis not present

## 2020-09-24 DIAGNOSIS — R2681 Unsteadiness on feet: Secondary | ICD-10-CM | POA: Diagnosis not present

## 2020-09-24 DIAGNOSIS — M1711 Unilateral primary osteoarthritis, right knee: Secondary | ICD-10-CM | POA: Diagnosis not present

## 2020-09-24 DIAGNOSIS — R531 Weakness: Secondary | ICD-10-CM | POA: Diagnosis not present

## 2020-09-24 DIAGNOSIS — M6281 Muscle weakness (generalized): Secondary | ICD-10-CM | POA: Diagnosis not present

## 2020-09-24 DIAGNOSIS — I10 Essential (primary) hypertension: Secondary | ICD-10-CM | POA: Diagnosis not present

## 2020-09-26 DIAGNOSIS — R531 Weakness: Secondary | ICD-10-CM | POA: Diagnosis not present

## 2020-09-26 DIAGNOSIS — R2689 Other abnormalities of gait and mobility: Secondary | ICD-10-CM | POA: Diagnosis not present

## 2020-09-26 DIAGNOSIS — M1712 Unilateral primary osteoarthritis, left knee: Secondary | ICD-10-CM | POA: Diagnosis not present

## 2020-09-26 DIAGNOSIS — M6282 Rhabdomyolysis: Secondary | ICD-10-CM | POA: Diagnosis not present

## 2020-09-26 DIAGNOSIS — M6281 Muscle weakness (generalized): Secondary | ICD-10-CM | POA: Diagnosis not present

## 2020-09-26 DIAGNOSIS — R2681 Unsteadiness on feet: Secondary | ICD-10-CM | POA: Diagnosis not present

## 2020-09-26 DIAGNOSIS — I10 Essential (primary) hypertension: Secondary | ICD-10-CM | POA: Diagnosis not present

## 2020-09-26 DIAGNOSIS — M1711 Unilateral primary osteoarthritis, right knee: Secondary | ICD-10-CM | POA: Diagnosis not present

## 2020-09-27 DIAGNOSIS — I10 Essential (primary) hypertension: Secondary | ICD-10-CM | POA: Diagnosis not present

## 2020-09-27 DIAGNOSIS — M6281 Muscle weakness (generalized): Secondary | ICD-10-CM | POA: Diagnosis not present

## 2020-09-27 DIAGNOSIS — R531 Weakness: Secondary | ICD-10-CM | POA: Diagnosis not present

## 2020-09-27 DIAGNOSIS — M1711 Unilateral primary osteoarthritis, right knee: Secondary | ICD-10-CM | POA: Diagnosis not present

## 2020-09-27 DIAGNOSIS — R2689 Other abnormalities of gait and mobility: Secondary | ICD-10-CM | POA: Diagnosis not present

## 2020-09-27 DIAGNOSIS — M6282 Rhabdomyolysis: Secondary | ICD-10-CM | POA: Diagnosis not present

## 2020-09-27 DIAGNOSIS — M1712 Unilateral primary osteoarthritis, left knee: Secondary | ICD-10-CM | POA: Diagnosis not present

## 2020-09-27 DIAGNOSIS — R2681 Unsteadiness on feet: Secondary | ICD-10-CM | POA: Diagnosis not present

## 2020-09-28 DIAGNOSIS — M1711 Unilateral primary osteoarthritis, right knee: Secondary | ICD-10-CM | POA: Diagnosis not present

## 2020-09-28 DIAGNOSIS — I10 Essential (primary) hypertension: Secondary | ICD-10-CM | POA: Diagnosis not present

## 2020-09-28 DIAGNOSIS — M1712 Unilateral primary osteoarthritis, left knee: Secondary | ICD-10-CM | POA: Diagnosis not present

## 2020-09-28 DIAGNOSIS — M6282 Rhabdomyolysis: Secondary | ICD-10-CM | POA: Diagnosis not present

## 2020-09-29 DIAGNOSIS — M6282 Rhabdomyolysis: Secondary | ICD-10-CM | POA: Diagnosis not present

## 2020-09-29 DIAGNOSIS — M1712 Unilateral primary osteoarthritis, left knee: Secondary | ICD-10-CM | POA: Diagnosis not present

## 2020-09-29 DIAGNOSIS — I10 Essential (primary) hypertension: Secondary | ICD-10-CM | POA: Diagnosis not present

## 2020-09-29 DIAGNOSIS — M1711 Unilateral primary osteoarthritis, right knee: Secondary | ICD-10-CM | POA: Diagnosis not present

## 2020-09-30 DIAGNOSIS — M1711 Unilateral primary osteoarthritis, right knee: Secondary | ICD-10-CM | POA: Diagnosis not present

## 2020-09-30 DIAGNOSIS — M1712 Unilateral primary osteoarthritis, left knee: Secondary | ICD-10-CM | POA: Diagnosis not present

## 2020-09-30 DIAGNOSIS — I10 Essential (primary) hypertension: Secondary | ICD-10-CM | POA: Diagnosis not present

## 2020-09-30 DIAGNOSIS — M6282 Rhabdomyolysis: Secondary | ICD-10-CM | POA: Diagnosis not present

## 2020-10-03 DIAGNOSIS — M1711 Unilateral primary osteoarthritis, right knee: Secondary | ICD-10-CM | POA: Diagnosis not present

## 2020-10-03 DIAGNOSIS — I10 Essential (primary) hypertension: Secondary | ICD-10-CM | POA: Diagnosis not present

## 2020-10-03 DIAGNOSIS — M1712 Unilateral primary osteoarthritis, left knee: Secondary | ICD-10-CM | POA: Diagnosis not present

## 2020-10-03 DIAGNOSIS — M6282 Rhabdomyolysis: Secondary | ICD-10-CM | POA: Diagnosis not present

## 2020-10-04 DIAGNOSIS — M1712 Unilateral primary osteoarthritis, left knee: Secondary | ICD-10-CM | POA: Diagnosis not present

## 2020-10-04 DIAGNOSIS — I10 Essential (primary) hypertension: Secondary | ICD-10-CM | POA: Diagnosis not present

## 2020-10-04 DIAGNOSIS — M6282 Rhabdomyolysis: Secondary | ICD-10-CM | POA: Diagnosis not present

## 2020-10-04 DIAGNOSIS — M1711 Unilateral primary osteoarthritis, right knee: Secondary | ICD-10-CM | POA: Diagnosis not present

## 2020-10-04 DIAGNOSIS — Z20822 Contact with and (suspected) exposure to covid-19: Secondary | ICD-10-CM | POA: Diagnosis not present

## 2020-10-05 DIAGNOSIS — M1711 Unilateral primary osteoarthritis, right knee: Secondary | ICD-10-CM | POA: Diagnosis not present

## 2020-10-05 DIAGNOSIS — M1712 Unilateral primary osteoarthritis, left knee: Secondary | ICD-10-CM | POA: Diagnosis not present

## 2020-10-05 DIAGNOSIS — I10 Essential (primary) hypertension: Secondary | ICD-10-CM | POA: Diagnosis not present

## 2020-10-05 DIAGNOSIS — M6282 Rhabdomyolysis: Secondary | ICD-10-CM | POA: Diagnosis not present

## 2020-10-06 DIAGNOSIS — Z20822 Contact with and (suspected) exposure to covid-19: Secondary | ICD-10-CM | POA: Diagnosis not present

## 2020-10-06 DIAGNOSIS — I10 Essential (primary) hypertension: Secondary | ICD-10-CM | POA: Diagnosis not present

## 2020-10-06 DIAGNOSIS — M1712 Unilateral primary osteoarthritis, left knee: Secondary | ICD-10-CM | POA: Diagnosis not present

## 2020-10-06 DIAGNOSIS — M6282 Rhabdomyolysis: Secondary | ICD-10-CM | POA: Diagnosis not present

## 2020-10-06 DIAGNOSIS — M1711 Unilateral primary osteoarthritis, right knee: Secondary | ICD-10-CM | POA: Diagnosis not present

## 2020-10-07 DIAGNOSIS — L89159 Pressure ulcer of sacral region, unspecified stage: Secondary | ICD-10-CM | POA: Diagnosis not present

## 2020-10-07 DIAGNOSIS — E46 Unspecified protein-calorie malnutrition: Secondary | ICD-10-CM | POA: Diagnosis not present

## 2020-10-07 DIAGNOSIS — I10 Essential (primary) hypertension: Secondary | ICD-10-CM | POA: Diagnosis not present

## 2020-10-07 DIAGNOSIS — M6282 Rhabdomyolysis: Secondary | ICD-10-CM | POA: Diagnosis not present

## 2020-10-07 DIAGNOSIS — M48061 Spinal stenosis, lumbar region without neurogenic claudication: Secondary | ICD-10-CM | POA: Diagnosis not present

## 2020-10-07 DIAGNOSIS — R29898 Other symptoms and signs involving the musculoskeletal system: Secondary | ICD-10-CM | POA: Diagnosis not present

## 2020-10-07 DIAGNOSIS — F0390 Unspecified dementia without behavioral disturbance: Secondary | ICD-10-CM | POA: Diagnosis not present

## 2020-10-07 DIAGNOSIS — M1712 Unilateral primary osteoarthritis, left knee: Secondary | ICD-10-CM | POA: Diagnosis not present

## 2020-10-07 DIAGNOSIS — M1711 Unilateral primary osteoarthritis, right knee: Secondary | ICD-10-CM | POA: Diagnosis not present

## 2020-10-08 DIAGNOSIS — F331 Major depressive disorder, recurrent, moderate: Secondary | ICD-10-CM | POA: Diagnosis not present

## 2020-10-10 DIAGNOSIS — M1711 Unilateral primary osteoarthritis, right knee: Secondary | ICD-10-CM | POA: Diagnosis not present

## 2020-10-10 DIAGNOSIS — I10 Essential (primary) hypertension: Secondary | ICD-10-CM | POA: Diagnosis not present

## 2020-10-10 DIAGNOSIS — M6282 Rhabdomyolysis: Secondary | ICD-10-CM | POA: Diagnosis not present

## 2020-10-10 DIAGNOSIS — M1712 Unilateral primary osteoarthritis, left knee: Secondary | ICD-10-CM | POA: Diagnosis not present

## 2020-10-11 DIAGNOSIS — M1711 Unilateral primary osteoarthritis, right knee: Secondary | ICD-10-CM | POA: Diagnosis not present

## 2020-10-11 DIAGNOSIS — M6282 Rhabdomyolysis: Secondary | ICD-10-CM | POA: Diagnosis not present

## 2020-10-11 DIAGNOSIS — I10 Essential (primary) hypertension: Secondary | ICD-10-CM | POA: Diagnosis not present

## 2020-10-11 DIAGNOSIS — M1712 Unilateral primary osteoarthritis, left knee: Secondary | ICD-10-CM | POA: Diagnosis not present

## 2020-10-12 DIAGNOSIS — M1711 Unilateral primary osteoarthritis, right knee: Secondary | ICD-10-CM | POA: Diagnosis not present

## 2020-10-12 DIAGNOSIS — M1712 Unilateral primary osteoarthritis, left knee: Secondary | ICD-10-CM | POA: Diagnosis not present

## 2020-10-12 DIAGNOSIS — M6282 Rhabdomyolysis: Secondary | ICD-10-CM | POA: Diagnosis not present

## 2020-10-12 DIAGNOSIS — I10 Essential (primary) hypertension: Secondary | ICD-10-CM | POA: Diagnosis not present

## 2020-10-13 DIAGNOSIS — I10 Essential (primary) hypertension: Secondary | ICD-10-CM | POA: Diagnosis not present

## 2020-10-13 DIAGNOSIS — M1712 Unilateral primary osteoarthritis, left knee: Secondary | ICD-10-CM | POA: Diagnosis not present

## 2020-10-13 DIAGNOSIS — M1711 Unilateral primary osteoarthritis, right knee: Secondary | ICD-10-CM | POA: Diagnosis not present

## 2020-10-13 DIAGNOSIS — M6282 Rhabdomyolysis: Secondary | ICD-10-CM | POA: Diagnosis not present

## 2020-10-14 DIAGNOSIS — M1712 Unilateral primary osteoarthritis, left knee: Secondary | ICD-10-CM | POA: Diagnosis not present

## 2020-10-14 DIAGNOSIS — M6282 Rhabdomyolysis: Secondary | ICD-10-CM | POA: Diagnosis not present

## 2020-10-14 DIAGNOSIS — M1711 Unilateral primary osteoarthritis, right knee: Secondary | ICD-10-CM | POA: Diagnosis not present

## 2020-10-14 DIAGNOSIS — I10 Essential (primary) hypertension: Secondary | ICD-10-CM | POA: Diagnosis not present

## 2020-10-17 DIAGNOSIS — M1712 Unilateral primary osteoarthritis, left knee: Secondary | ICD-10-CM | POA: Diagnosis not present

## 2020-10-17 DIAGNOSIS — M1711 Unilateral primary osteoarthritis, right knee: Secondary | ICD-10-CM | POA: Diagnosis not present

## 2020-10-17 DIAGNOSIS — I10 Essential (primary) hypertension: Secondary | ICD-10-CM | POA: Diagnosis not present

## 2020-10-17 DIAGNOSIS — M6282 Rhabdomyolysis: Secondary | ICD-10-CM | POA: Diagnosis not present

## 2020-10-18 DIAGNOSIS — G4700 Insomnia, unspecified: Secondary | ICD-10-CM | POA: Diagnosis not present

## 2020-10-18 DIAGNOSIS — F419 Anxiety disorder, unspecified: Secondary | ICD-10-CM | POA: Diagnosis not present

## 2020-10-18 DIAGNOSIS — M1712 Unilateral primary osteoarthritis, left knee: Secondary | ICD-10-CM | POA: Diagnosis not present

## 2020-10-18 DIAGNOSIS — F331 Major depressive disorder, recurrent, moderate: Secondary | ICD-10-CM | POA: Diagnosis not present

## 2020-10-18 DIAGNOSIS — I10 Essential (primary) hypertension: Secondary | ICD-10-CM | POA: Diagnosis not present

## 2020-10-18 DIAGNOSIS — M6282 Rhabdomyolysis: Secondary | ICD-10-CM | POA: Diagnosis not present

## 2020-10-18 DIAGNOSIS — M1711 Unilateral primary osteoarthritis, right knee: Secondary | ICD-10-CM | POA: Diagnosis not present

## 2020-10-19 DIAGNOSIS — M1711 Unilateral primary osteoarthritis, right knee: Secondary | ICD-10-CM | POA: Diagnosis not present

## 2020-10-19 DIAGNOSIS — I10 Essential (primary) hypertension: Secondary | ICD-10-CM | POA: Diagnosis not present

## 2020-10-19 DIAGNOSIS — M1712 Unilateral primary osteoarthritis, left knee: Secondary | ICD-10-CM | POA: Diagnosis not present

## 2020-10-19 DIAGNOSIS — M6282 Rhabdomyolysis: Secondary | ICD-10-CM | POA: Diagnosis not present

## 2020-10-20 DIAGNOSIS — M1712 Unilateral primary osteoarthritis, left knee: Secondary | ICD-10-CM | POA: Diagnosis not present

## 2020-10-20 DIAGNOSIS — M1711 Unilateral primary osteoarthritis, right knee: Secondary | ICD-10-CM | POA: Diagnosis not present

## 2020-10-20 DIAGNOSIS — M6282 Rhabdomyolysis: Secondary | ICD-10-CM | POA: Diagnosis not present

## 2020-10-20 DIAGNOSIS — I10 Essential (primary) hypertension: Secondary | ICD-10-CM | POA: Diagnosis not present

## 2020-10-24 DIAGNOSIS — M1711 Unilateral primary osteoarthritis, right knee: Secondary | ICD-10-CM | POA: Diagnosis not present

## 2020-10-24 DIAGNOSIS — F331 Major depressive disorder, recurrent, moderate: Secondary | ICD-10-CM | POA: Diagnosis not present

## 2020-10-24 DIAGNOSIS — M1712 Unilateral primary osteoarthritis, left knee: Secondary | ICD-10-CM | POA: Diagnosis not present

## 2020-10-24 DIAGNOSIS — I10 Essential (primary) hypertension: Secondary | ICD-10-CM | POA: Diagnosis not present

## 2020-10-24 DIAGNOSIS — M6282 Rhabdomyolysis: Secondary | ICD-10-CM | POA: Diagnosis not present

## 2020-10-26 DIAGNOSIS — M6282 Rhabdomyolysis: Secondary | ICD-10-CM | POA: Diagnosis not present

## 2020-10-26 DIAGNOSIS — M1712 Unilateral primary osteoarthritis, left knee: Secondary | ICD-10-CM | POA: Diagnosis not present

## 2020-10-26 DIAGNOSIS — I10 Essential (primary) hypertension: Secondary | ICD-10-CM | POA: Diagnosis not present

## 2020-10-26 DIAGNOSIS — M1711 Unilateral primary osteoarthritis, right knee: Secondary | ICD-10-CM | POA: Diagnosis not present

## 2020-10-27 DIAGNOSIS — M1712 Unilateral primary osteoarthritis, left knee: Secondary | ICD-10-CM | POA: Diagnosis not present

## 2020-10-27 DIAGNOSIS — I10 Essential (primary) hypertension: Secondary | ICD-10-CM | POA: Diagnosis not present

## 2020-10-27 DIAGNOSIS — M6282 Rhabdomyolysis: Secondary | ICD-10-CM | POA: Diagnosis not present

## 2020-10-27 DIAGNOSIS — M1711 Unilateral primary osteoarthritis, right knee: Secondary | ICD-10-CM | POA: Diagnosis not present

## 2020-10-28 DIAGNOSIS — I10 Essential (primary) hypertension: Secondary | ICD-10-CM | POA: Diagnosis not present

## 2020-10-28 DIAGNOSIS — M1712 Unilateral primary osteoarthritis, left knee: Secondary | ICD-10-CM | POA: Diagnosis not present

## 2020-10-28 DIAGNOSIS — M1711 Unilateral primary osteoarthritis, right knee: Secondary | ICD-10-CM | POA: Diagnosis not present

## 2020-10-28 DIAGNOSIS — M6282 Rhabdomyolysis: Secondary | ICD-10-CM | POA: Diagnosis not present

## 2020-10-29 DIAGNOSIS — M1711 Unilateral primary osteoarthritis, right knee: Secondary | ICD-10-CM | POA: Diagnosis not present

## 2020-10-29 DIAGNOSIS — I10 Essential (primary) hypertension: Secondary | ICD-10-CM | POA: Diagnosis not present

## 2020-10-29 DIAGNOSIS — M6282 Rhabdomyolysis: Secondary | ICD-10-CM | POA: Diagnosis not present

## 2020-10-29 DIAGNOSIS — M1712 Unilateral primary osteoarthritis, left knee: Secondary | ICD-10-CM | POA: Diagnosis not present

## 2020-10-31 DIAGNOSIS — I10 Essential (primary) hypertension: Secondary | ICD-10-CM | POA: Diagnosis not present

## 2020-10-31 DIAGNOSIS — M1712 Unilateral primary osteoarthritis, left knee: Secondary | ICD-10-CM | POA: Diagnosis not present

## 2020-10-31 DIAGNOSIS — M1711 Unilateral primary osteoarthritis, right knee: Secondary | ICD-10-CM | POA: Diagnosis not present

## 2020-10-31 DIAGNOSIS — M6282 Rhabdomyolysis: Secondary | ICD-10-CM | POA: Diagnosis not present

## 2020-11-01 DIAGNOSIS — M1712 Unilateral primary osteoarthritis, left knee: Secondary | ICD-10-CM | POA: Diagnosis not present

## 2020-11-01 DIAGNOSIS — I10 Essential (primary) hypertension: Secondary | ICD-10-CM | POA: Diagnosis not present

## 2020-11-01 DIAGNOSIS — M1711 Unilateral primary osteoarthritis, right knee: Secondary | ICD-10-CM | POA: Diagnosis not present

## 2020-11-01 DIAGNOSIS — M6282 Rhabdomyolysis: Secondary | ICD-10-CM | POA: Diagnosis not present

## 2020-11-02 DIAGNOSIS — M6282 Rhabdomyolysis: Secondary | ICD-10-CM | POA: Diagnosis not present

## 2020-11-02 DIAGNOSIS — I10 Essential (primary) hypertension: Secondary | ICD-10-CM | POA: Diagnosis not present

## 2020-11-02 DIAGNOSIS — M1712 Unilateral primary osteoarthritis, left knee: Secondary | ICD-10-CM | POA: Diagnosis not present

## 2020-11-02 DIAGNOSIS — M1711 Unilateral primary osteoarthritis, right knee: Secondary | ICD-10-CM | POA: Diagnosis not present

## 2020-11-04 DIAGNOSIS — I10 Essential (primary) hypertension: Secondary | ICD-10-CM | POA: Diagnosis not present

## 2020-11-04 DIAGNOSIS — M6282 Rhabdomyolysis: Secondary | ICD-10-CM | POA: Diagnosis not present

## 2020-11-04 DIAGNOSIS — M1712 Unilateral primary osteoarthritis, left knee: Secondary | ICD-10-CM | POA: Diagnosis not present

## 2020-11-04 DIAGNOSIS — M1711 Unilateral primary osteoarthritis, right knee: Secondary | ICD-10-CM | POA: Diagnosis not present

## 2020-11-05 DIAGNOSIS — I10 Essential (primary) hypertension: Secondary | ICD-10-CM | POA: Diagnosis not present

## 2020-11-05 DIAGNOSIS — M1711 Unilateral primary osteoarthritis, right knee: Secondary | ICD-10-CM | POA: Diagnosis not present

## 2020-11-05 DIAGNOSIS — M6282 Rhabdomyolysis: Secondary | ICD-10-CM | POA: Diagnosis not present

## 2020-11-05 DIAGNOSIS — M1712 Unilateral primary osteoarthritis, left knee: Secondary | ICD-10-CM | POA: Diagnosis not present

## 2020-11-05 IMAGING — CT CT CERVICAL SPINE W/O CM
3 of 4 series · 12 of 35 positions shown, 14 images · non-contrast
Comparison: Cervical spine CT 01/12/2020

CLINICAL DATA: Neck trauma, blunt

Patient reports generalized weakness.
EXAM:
CT CERVICAL SPINE WITHOUT CONTRAST
TECHNIQUE: Multidetector CT imaging of the cervical spine was performed without
intravenous contrast. Multiplanar CT image reconstructions were also
generated.

[Series 11: orthogonal bone · axial · 0.23mm/px · z∈[+37,+158]mm · 4 of 102 slices shown, 5 images]
[im 17/102  soft-tissue]
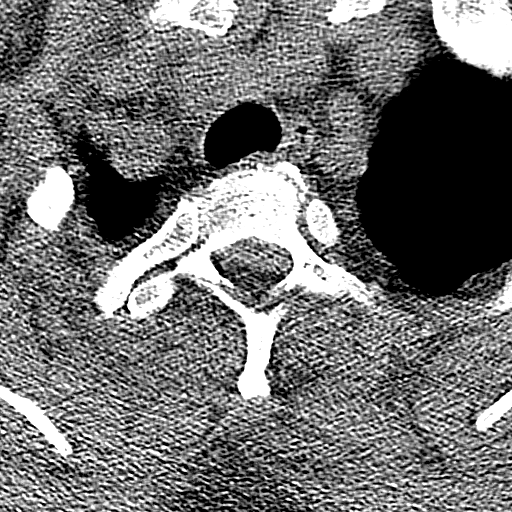
[im 17/102  bone]
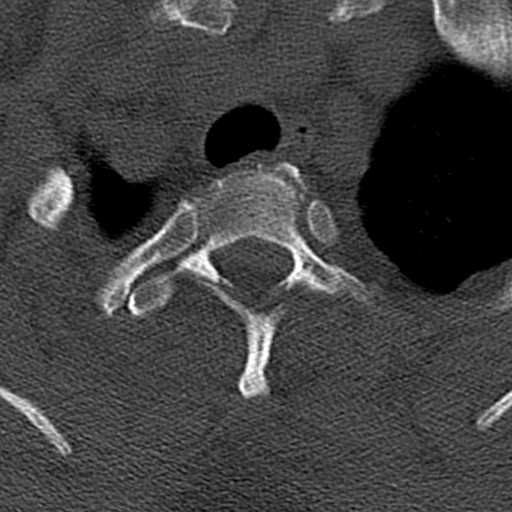
[im 34/102  bone]
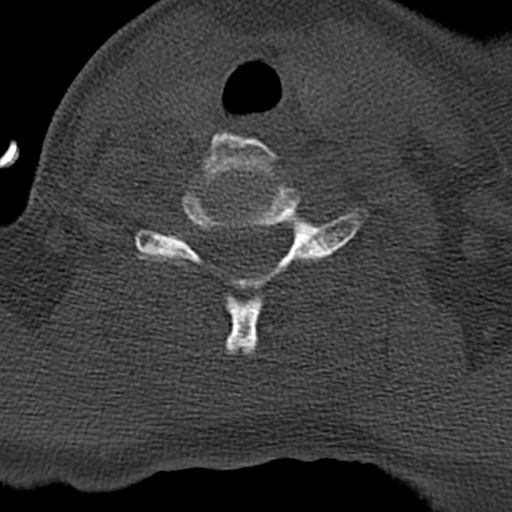
[im 68/102  bone]
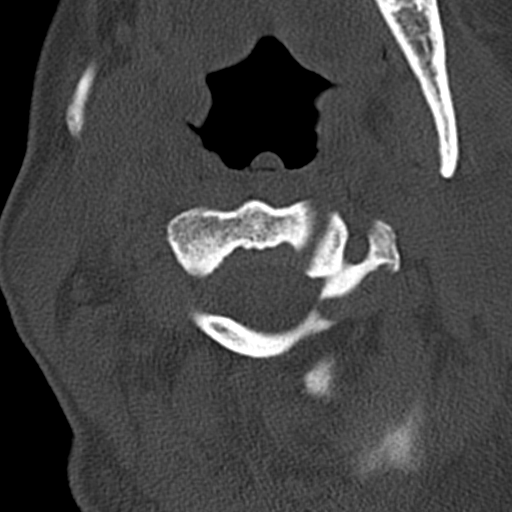
[im 85/102  bone]
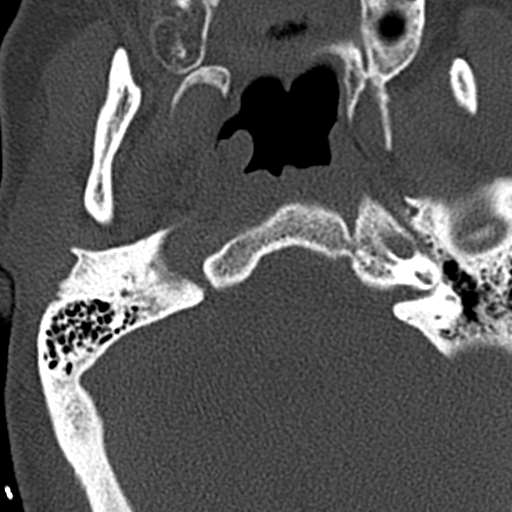

[Series 12: coronal bone · coronal · 0.23mm/px · 3 of 61 slices shown]
[im 13/61  bone]
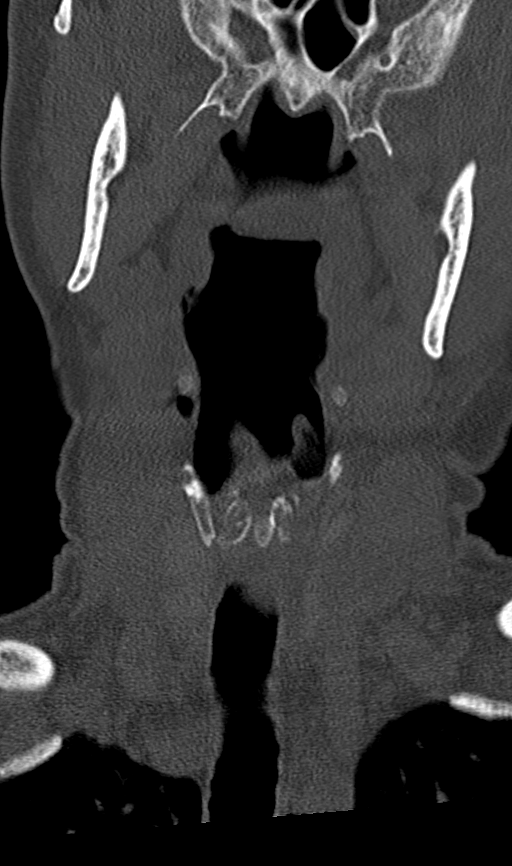
[im 25/61  bone]
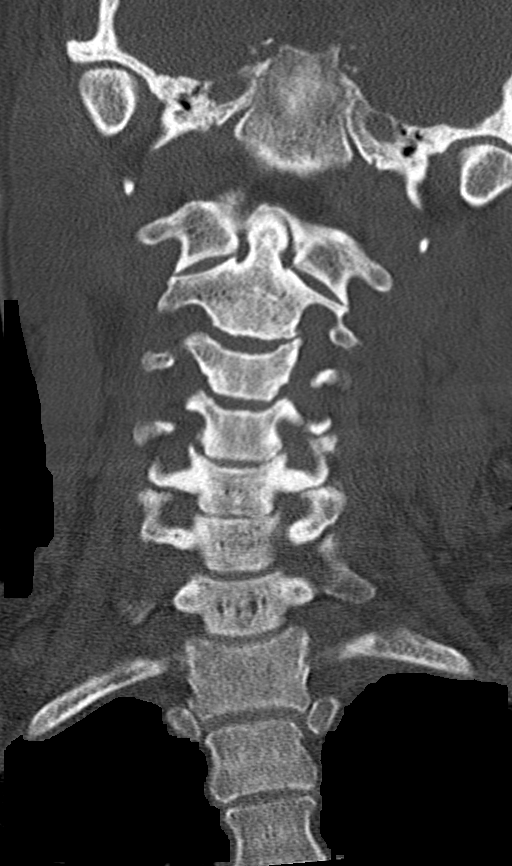
[im 36/61  bone]
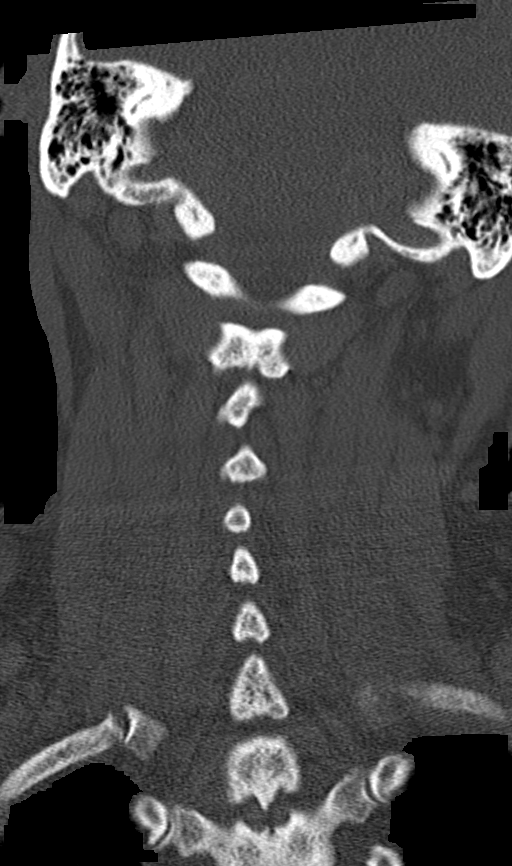

[Series 13: sagittal bone · sagittal · 0.27mm/px · 5 of 61 slices shown, 6 images]
[im 21/61  bone]
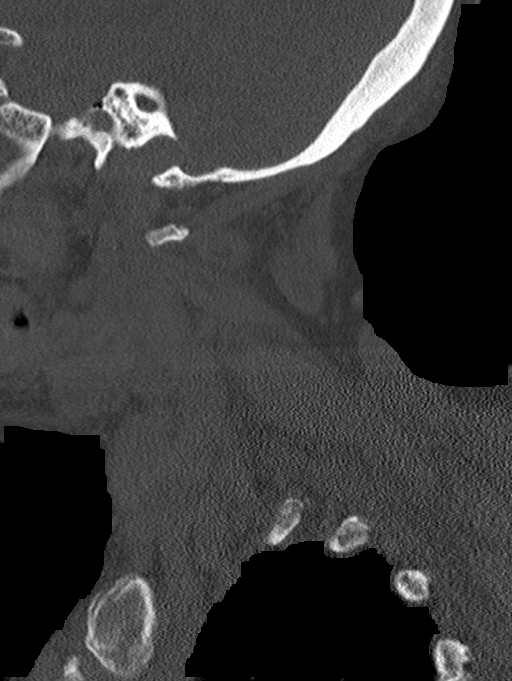
[im 26/61  bone]
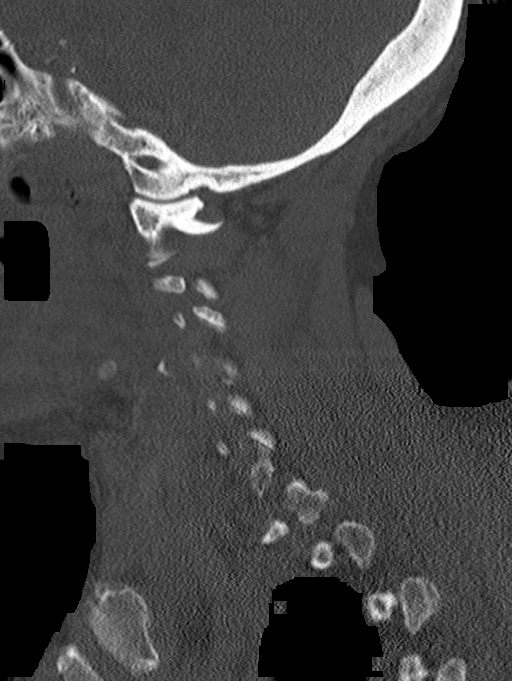
[im 31/61  soft-tissue]
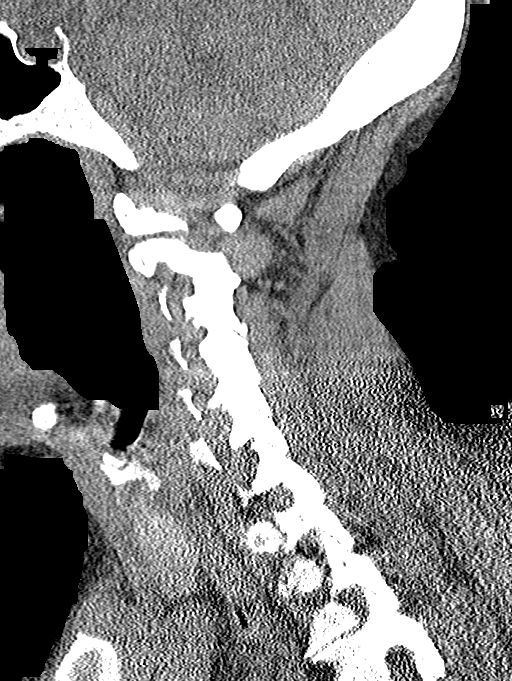
[im 31/61  bone]
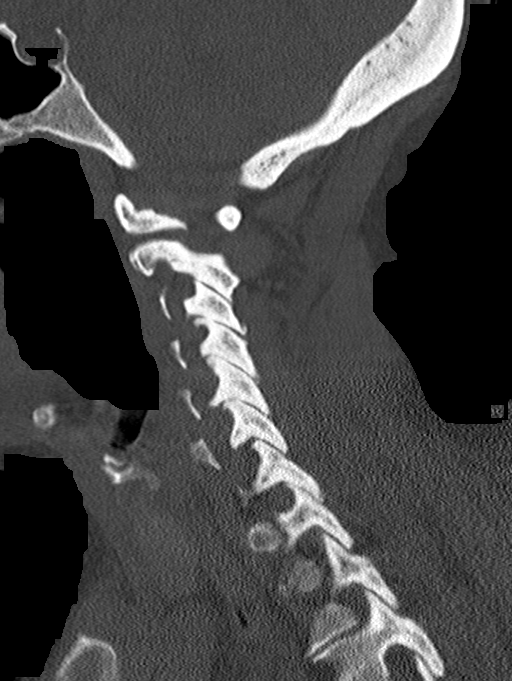
[im 36/61  bone]
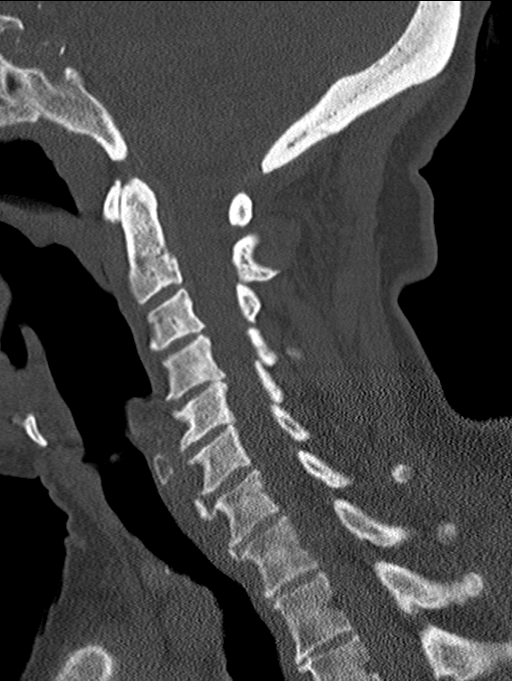
[im 41/61  bone]
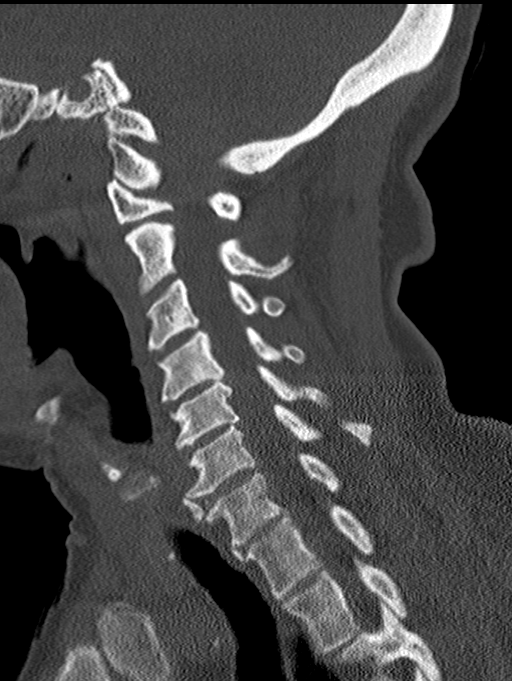

[12 of 35 positions shown; findings below may reference images not displayed]

FINDINGS: Alignment: Broad-based rightward curvature, not seen on prior. Minor
retrolisthesis of C4 on C5, C5 on C6 and C7 on T1, unchanged.

Skull base and vertebrae: No acute fracture. No primary bone lesion
or focal pathologic process.

Soft tissues and spinal canal: No prevertebral fluid or swelling. No
visible canal hematoma.

Disc levels: Stable degree of diffuse degenerative disc disease and
facet hypertrophy. Right neural foraminal stenosis at C4-C5 is
unchanged.

Upper chest: No acute or unexpected findings.

Other: Carotid calcifications.
IMPRESSION: 1. No acute fracture or subluxation of the cervical spine.
2. Broad-based rightward curvature of the cervical spine is new from
prior, may be positioning or related to muscle spasm.
3. Multilevel degenerative disc disease and facet hypertrophy is
unchanged from prior.

## 2020-11-05 IMAGING — CT CT HEAD W/O CM
3 series · 16 of 47 positions shown, 19 images · non-contrast
Comparison: Head CT 01/12/2020

CLINICAL DATA: Headache, post traumatic

Patient reports weakness.
EXAM:
CT HEAD WITHOUT CONTRAST
TECHNIQUE: Contiguous axial images were obtained from the base of the skull
through the vertex without intravenous contrast.

[Series 3: head wo · axial · 0.50mm/px · z∈[+187,+322]mm · 10 of 33 slices shown, 13 images]
[im 3/33  brain]
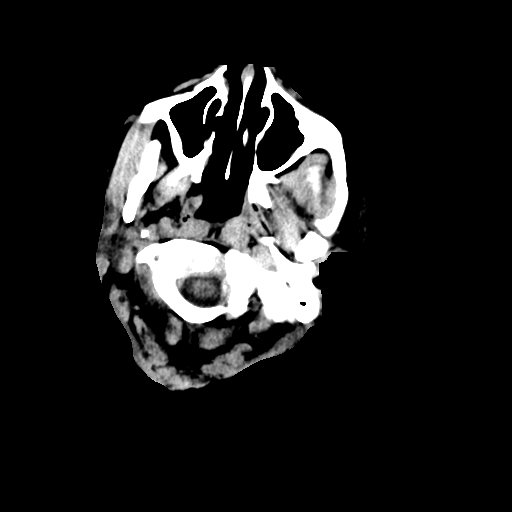
[im 3/33  bone]
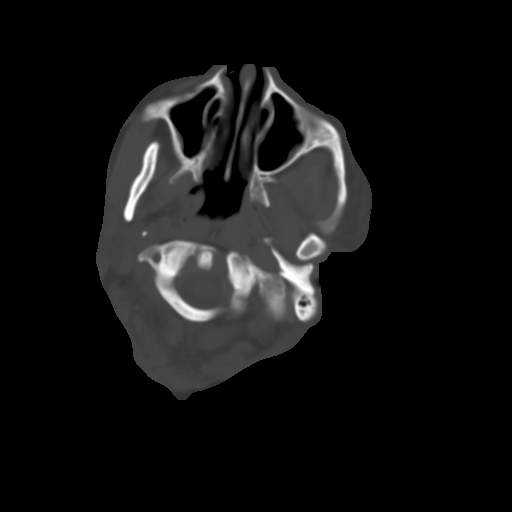
[im 6/33  brain]
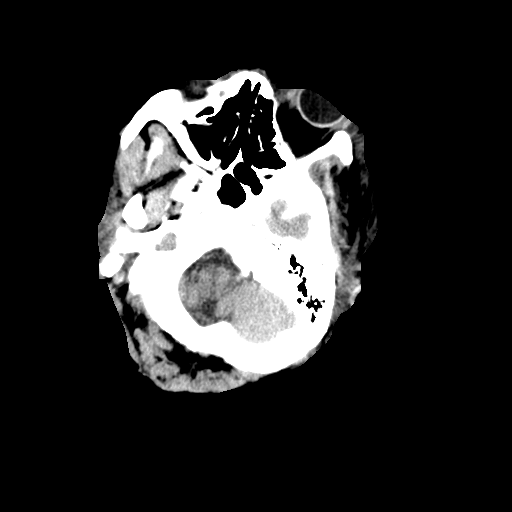
[im 9/33  brain]
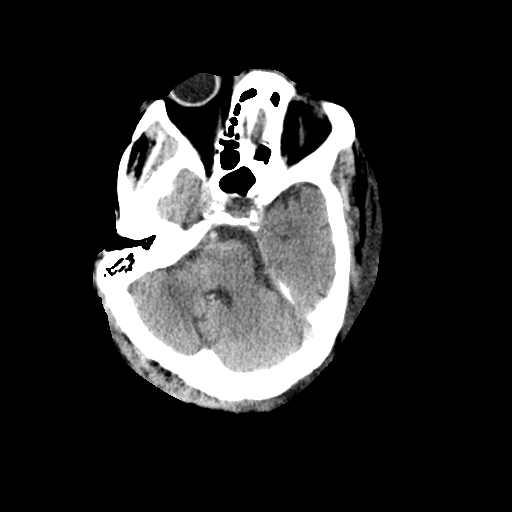
[im 12/33  brain]
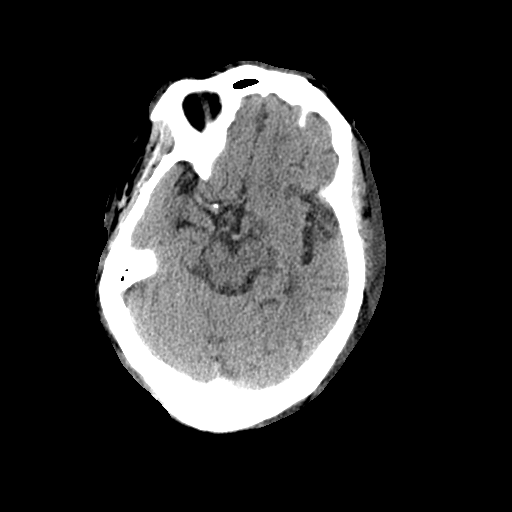
[im 15/33  brain]
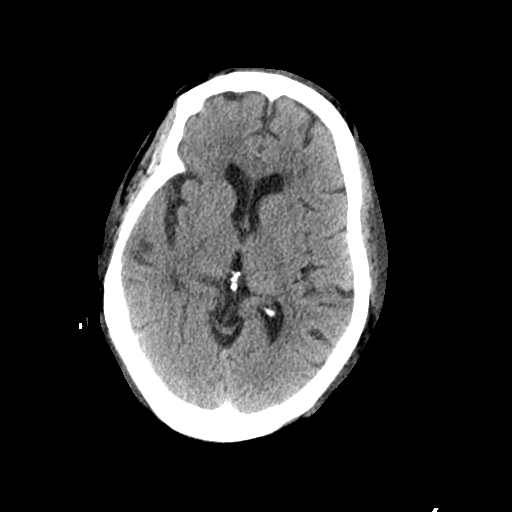
[im 15/33  bone]
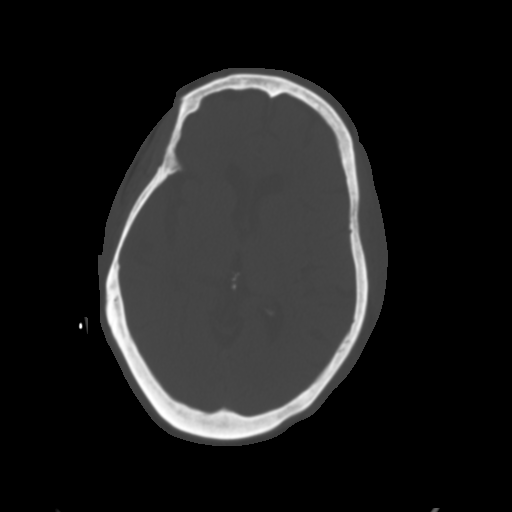
[im 18/33  brain]
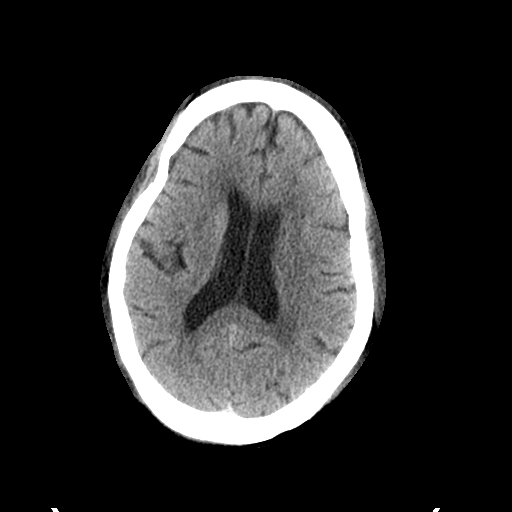
[im 21/33  brain]
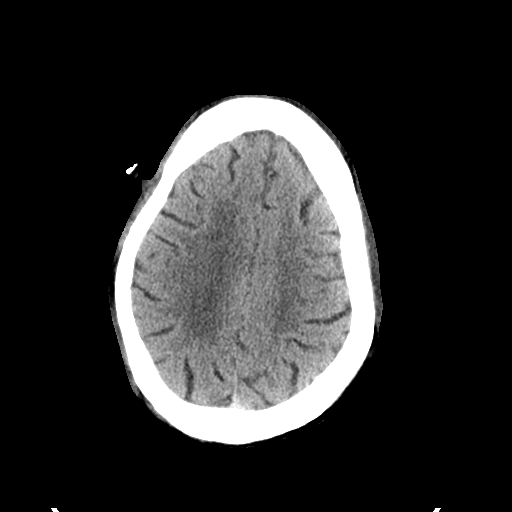
[im 25/33  brain]
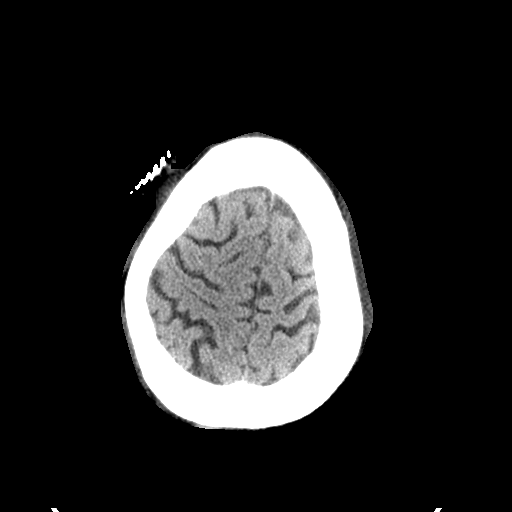
[im 27/33  brain]
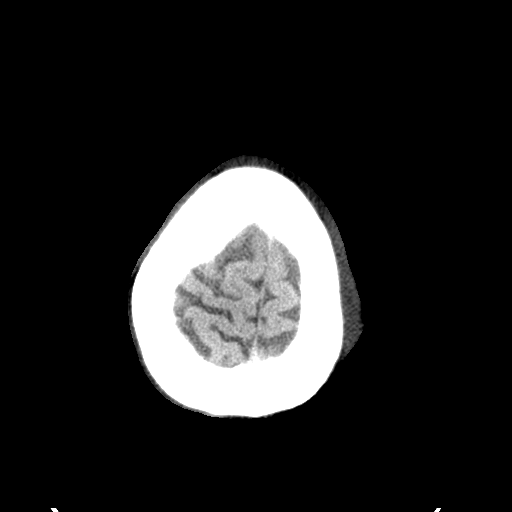
[im 27/33  bone]
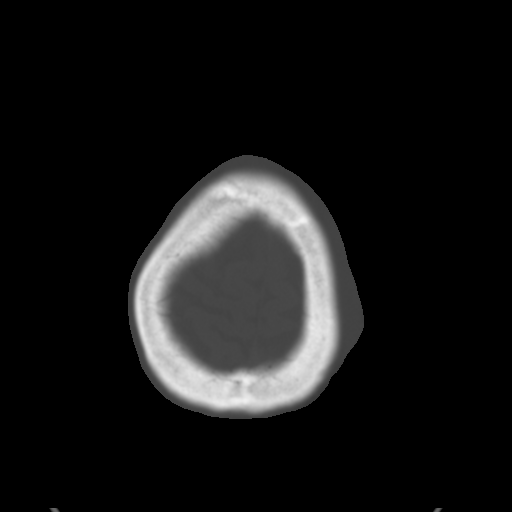
[im 30/33  brain]
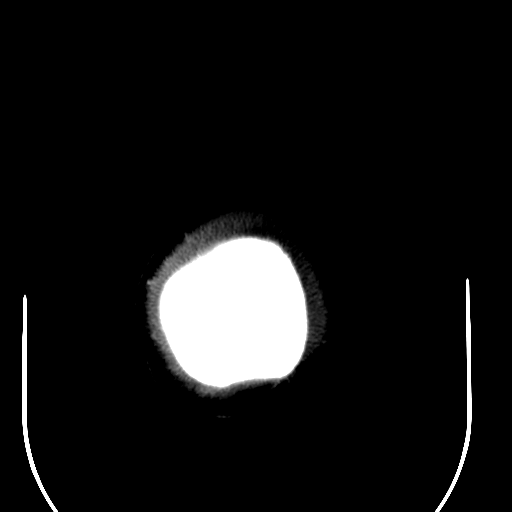

[Series 6: coronal soft tissue · coronal · 0.36mm/px · 3 of 71 slices shown]
[im 24/71  brain]
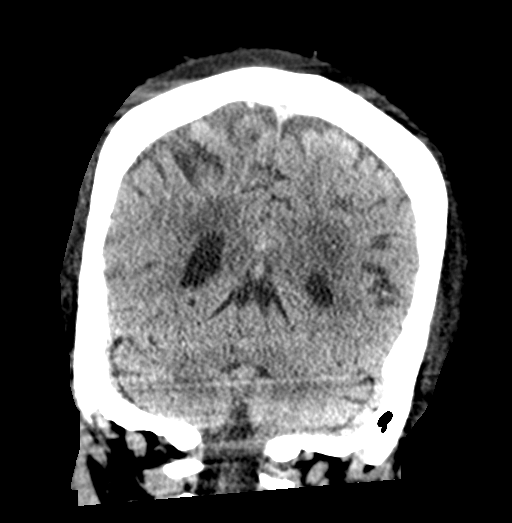
[im 32/71  brain]
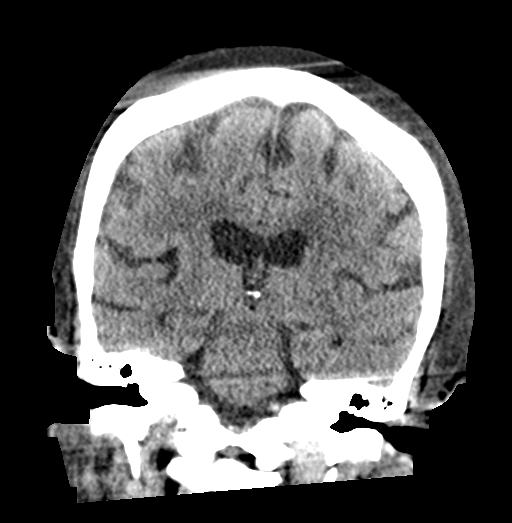
[im 39/71  brain]
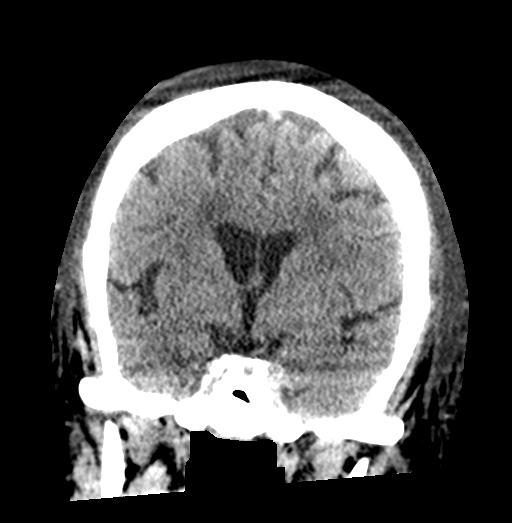

[Series 7: sagittal soft tissue · sagittal · 0.36mm/px · 3 of 61 slices shown]
[im 21/61  brain]
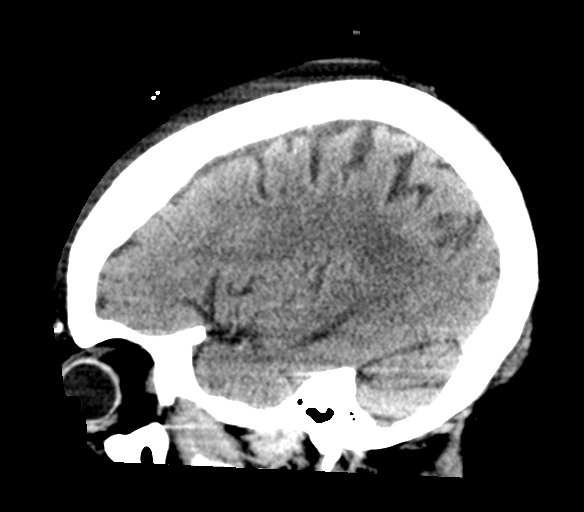
[im 31/61  brain]
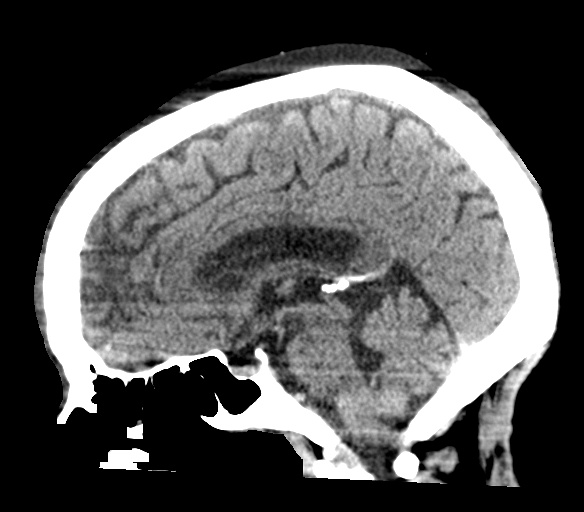
[im 40/61  brain]
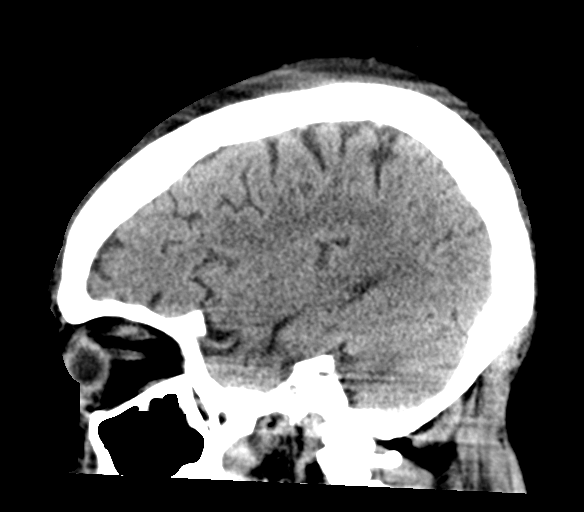

[16 of 47 positions shown; findings below may reference images not displayed]

FINDINGS: Brain: No intracranial hemorrhage, mass effect, or midline shift.
Generalized atrophy and chronic small vessel ischemia, stable from
prior. 3 No hydrocephalus. The basilar cisterns are patent. No
evidence of territorial infarct or acute ischemia. No extra-axial or
intracranial fluid collection.

Vascular: Atherosclerosis of skullbase vasculature without
hyperdense vessel or abnormal calcification.

Skull: No fracture or focal lesion.

Sinuses/Orbits: Mucosal thickening throughout ethmoid air cells and
involving bilateral maxillary sinuses, chronic but slightly
progressed from prior. No sinus fluid level. Mastoid air cells are
clear.

Other: Subcutaneous soft tissue edema involving the left side of the
face. There is diffuse fluid in the scalp which may be subgaleal
measuring up to 11 mm.
IMPRESSION: 1. No acute intracranial abnormality. No skull fracture.
2. Stable atrophy and chronic small vessel ischemia.
3. Subcutaneous soft tissue edema involving the left side of the
face. Diffuse fluid in the scalp, near circumferential, which may be
subgaleal in location measuring up to 11 mm in thickness. This is of
unknown etiology.

## 2020-11-07 DIAGNOSIS — I10 Essential (primary) hypertension: Secondary | ICD-10-CM | POA: Diagnosis not present

## 2020-11-07 DIAGNOSIS — M1712 Unilateral primary osteoarthritis, left knee: Secondary | ICD-10-CM | POA: Diagnosis not present

## 2020-11-07 DIAGNOSIS — M1711 Unilateral primary osteoarthritis, right knee: Secondary | ICD-10-CM | POA: Diagnosis not present

## 2020-11-07 DIAGNOSIS — M6282 Rhabdomyolysis: Secondary | ICD-10-CM | POA: Diagnosis not present

## 2020-11-08 DIAGNOSIS — I10 Essential (primary) hypertension: Secondary | ICD-10-CM | POA: Diagnosis not present

## 2020-11-08 DIAGNOSIS — M1712 Unilateral primary osteoarthritis, left knee: Secondary | ICD-10-CM | POA: Diagnosis not present

## 2020-11-08 DIAGNOSIS — M1711 Unilateral primary osteoarthritis, right knee: Secondary | ICD-10-CM | POA: Diagnosis not present

## 2020-11-08 DIAGNOSIS — M6282 Rhabdomyolysis: Secondary | ICD-10-CM | POA: Diagnosis not present

## 2020-11-09 DIAGNOSIS — M1712 Unilateral primary osteoarthritis, left knee: Secondary | ICD-10-CM | POA: Diagnosis not present

## 2020-11-09 DIAGNOSIS — M6282 Rhabdomyolysis: Secondary | ICD-10-CM | POA: Diagnosis not present

## 2020-11-09 DIAGNOSIS — M1711 Unilateral primary osteoarthritis, right knee: Secondary | ICD-10-CM | POA: Diagnosis not present

## 2020-11-09 DIAGNOSIS — I10 Essential (primary) hypertension: Secondary | ICD-10-CM | POA: Diagnosis not present

## 2020-11-11 DIAGNOSIS — M1711 Unilateral primary osteoarthritis, right knee: Secondary | ICD-10-CM | POA: Diagnosis not present

## 2020-11-11 DIAGNOSIS — M1712 Unilateral primary osteoarthritis, left knee: Secondary | ICD-10-CM | POA: Diagnosis not present

## 2020-11-11 DIAGNOSIS — M6282 Rhabdomyolysis: Secondary | ICD-10-CM | POA: Diagnosis not present

## 2020-11-11 DIAGNOSIS — I10 Essential (primary) hypertension: Secondary | ICD-10-CM | POA: Diagnosis not present

## 2020-11-12 ENCOUNTER — Emergency Department (HOSPITAL_COMMUNITY)
Admission: EM | Admit: 2020-11-12 | Discharge: 2020-11-12 | Disposition: A | Discharge status: Skilled Nursing Facility | Payer: Medicare HMO | Attending: Emergency Medicine | Admitting: Emergency Medicine

## 2020-11-12 ENCOUNTER — Emergency Department (HOSPITAL_COMMUNITY): Payer: Medicare HMO

## 2020-11-12 ENCOUNTER — Encounter (HOSPITAL_COMMUNITY): Payer: Self-pay | Admitting: Emergency Medicine

## 2020-11-12 ENCOUNTER — Other Ambulatory Visit: Payer: Self-pay

## 2020-11-12 DIAGNOSIS — Z7401 Bed confinement status: Secondary | ICD-10-CM | POA: Diagnosis not present

## 2020-11-12 DIAGNOSIS — M1711 Unilateral primary osteoarthritis, right knee: Secondary | ICD-10-CM | POA: Diagnosis not present

## 2020-11-12 DIAGNOSIS — Z7982 Long term (current) use of aspirin: Secondary | ICD-10-CM | POA: Insufficient documentation

## 2020-11-12 DIAGNOSIS — I10 Essential (primary) hypertension: Secondary | ICD-10-CM | POA: Insufficient documentation

## 2020-11-12 DIAGNOSIS — M2550 Pain in unspecified joint: Secondary | ICD-10-CM | POA: Diagnosis not present

## 2020-11-12 DIAGNOSIS — U071 COVID-19: Secondary | ICD-10-CM | POA: Diagnosis not present

## 2020-11-12 DIAGNOSIS — R0902 Hypoxemia: Secondary | ICD-10-CM | POA: Diagnosis not present

## 2020-11-12 DIAGNOSIS — M1712 Unilateral primary osteoarthritis, left knee: Secondary | ICD-10-CM | POA: Diagnosis not present

## 2020-11-12 DIAGNOSIS — R059 Cough, unspecified: Secondary | ICD-10-CM | POA: Diagnosis not present

## 2020-11-12 DIAGNOSIS — Z87891 Personal history of nicotine dependence: Secondary | ICD-10-CM | POA: Insufficient documentation

## 2020-11-12 DIAGNOSIS — R0689 Other abnormalities of breathing: Secondary | ICD-10-CM | POA: Diagnosis not present

## 2020-11-12 DIAGNOSIS — R0602 Shortness of breath: Secondary | ICD-10-CM

## 2020-11-12 DIAGNOSIS — M6282 Rhabdomyolysis: Secondary | ICD-10-CM | POA: Diagnosis not present

## 2020-11-12 LAB — BASIC METABOLIC PANEL
Anion gap: 11 (ref 5–15)
BUN: 15 mg/dL (ref 8–23)
CO2: 24 mmol/L (ref 22–32)
Calcium: 9.2 mg/dL (ref 8.9–10.3)
Chloride: 104 mmol/L (ref 98–111)
Creatinine, Ser: 0.7 mg/dL (ref 0.44–1.00)
GFR, Estimated: >60 mL/min (ref >60)
Glucose, Bld: 79 mg/dL (ref 70–99)
Potassium: 3.2 mmol/L — ABNORMAL LOW (ref 3.5–5.1)
Sodium: 139 mmol/L (ref 135–145)

## 2020-11-12 LAB — CBC
HCT: 37.5 % (ref 36.0–46.0)
Hemoglobin: 11.7 g/dL — ABNORMAL LOW (ref 12.0–15.0)
MCH: 29 pg (ref 26.0–34.0)
MCHC: 31.2 g/dL (ref 30.0–36.0)
MCV: 92.8 fL (ref 80.0–100.0)
Platelets: 186 10*3/uL (ref 150–400)
RBC: 4.04 MIL/uL (ref 3.87–5.11)
RDW: 15 % (ref 11.5–15.5)
WBC: 6.8 10*3/uL (ref 4.0–10.5)
nRBC: 0 % (ref 0.0–0.2)

## 2020-11-12 MED ORDER — LORAZEPAM 2 MG/ML IJ SOLN: 1.0000 mg | Freq: Once | INTRAMUSCULAR | Status: DC

## 2020-11-12 NOTE — ED Provider Notes (Signed)
[facility] DEPT Provider Note   CSN: [id number] Arrival date & time: 11/12/20  1622     History Chief Complaint  Patient presents with  . Covid Positive    Diane Snow is a 75 y.o. female.  Patient is a resident at [facility] skilled nursing facility.  She said she was coughing today and is COVID-positive.  Initially staff said she was hypoxic and put on nonrebreather but currently 98% on room air.  Patient admits to cough but denies any shortness of breath vomiting diarrhea.  She said her appetite is good and she feels well.  The history is provided by the patient.  Cough Cough characteristics:  Non-productive Sputum characteristics:  Nondescript Severity:  Moderate Onset quality:  Gradual Duration:  1 day Timing:  Intermittent Progression:  Unchanged Chronicity:  New Relieved by:  None tried Worsened by:  Nothing Ineffective treatments:  None tried Associated symptoms: no chest pain, no fever, no headaches, no myalgias, no shortness of breath and no sore throat        Past Medical History:  Diagnosis Date  . Abnormal gait   . Allergy   . Arthritis   . Hx of adenomatous colonic polyps 09/27/2014  . Hypertension   . Osteoarthritis   . Rhabdomyolysis   . Weakness     Patient Active Problem List   Diagnosis Date Noted  . Cervical stenosis (uterine cervix) 05/23/2020  . Weakness 05/19/2020  . Gait abnormality 05/19/2020  . Pressure injury of skin 04/20/2020  . Generalized weakness 04/18/2020  . AKI (acute kidney injury) ([place]) 04/18/2020  . Leg swelling 04/18/2020  . Rhabdomyolysis 04/18/2020  . Ambulatory dysfunction 04/18/2020  . Left-sided weakness 03/16/2020  . Weight loss 03/16/2020  . Gait instability 03/16/2020  . Dysthymia 03/16/2020  . Chronic pain of right knee 06/15/2019  . Unilateral primary osteoarthritis, right knee 06/15/2019  . Chronic pain of left knee 05/12/2018  . Unilateral primary osteoarthritis, left  knee 05/12/2018  . Essential hypertension 11/16/2015  . Hx of adenomatous colonic polyps 09/27/2014    Past Surgical History:  Procedure Laterality Date  . BREAST BIOPSY Left 1976  . BREAST EXCISIONAL BIOPSY Left   . COLONOSCOPY    . FOOT SURGERY Left   . PARTIAL HYSTERECTOMY     fibroid tumors     OB History   No obstetric history on file.     Family History  Problem Relation Age of Onset  . Kidney disease Mother        cause of death  . Hypertension Mother   . Stroke Mother   . Heart disease Father        CHF cause of death  . Hypertension Father   . Pneumonia Father   . Breast cancer Maternal Aunt   . Heart disease Brother        CHF cause of death  . Gout Brother   . Colon cancer Neg Hx   . Rectal cancer Neg Hx   . Stomach cancer Neg Hx   . Esophageal cancer Neg Hx   . Pancreatic cancer Neg Hx     Social History   Tobacco Use  . Smoking status: Former Smoker    Packs/day: 1.00    Years: 20.00    Pack years: 20.00    Types: Cigarettes    Quit date: 03/30/2011    Years since quitting: 9.6  . Smokeless tobacco: Never Used  Vaping Use  . Vaping Use: Never used  Substance Use Topics  . Alcohol use: Yes    Alcohol/week: 0.0 standard drinks    Comment: may drink one beer per day  . Drug use: No    Home Medications Prior to Admission medications   Medication Sig Start Date End Date Taking? Authorizing Provider  aspirin 81 MG tablet Take 81 mg by mouth daily.    [provider]  diclofenac sodium (VOLTAREN) 1 % GEL Apply 4 g topically 4 (four) times daily. 02/11/19   Pete Pelt, PA-C  escitalopram (LEXAPRO) 5 MG tablet Take 5 mg by mouth daily.    [provider]  feeding supplement, ENSURE ENLIVE, (ENSURE ENLIVE) LIQD Take 237 mLs by mouth 2 (two) times daily between meals. 04/23/20   Mariel Aloe, MD  furosemide (LASIX) 20 MG tablet Take 20 mg by mouth daily. 12/30/19   [provider]  gabapentin (NEURONTIN) 100 MG  capsule Take 100 mg by mouth 2 (two) times daily.    [provider]  metoprolol succinate (TOPROL-XL) 100 MG 24 hr tablet Take 100 mg by mouth daily. Take with or immediately following a meal.    [provider]  multivitamin (PROSIGHT) TABS tablet Take 1 tablet by mouth daily. 04/24/20   Mariel Aloe, MD  potassium chloride (K-DUR,KLOR-CON) 10 MEQ tablet Take 10 mEq by mouth daily. 04/29/18   [provider]  thiamine 100 MG tablet Take 1 tablet (100 mg total) by mouth daily. 04/24/20   Mariel Aloe, MD  vitamin B-12 (CYANOCOBALAMIN) 1000 MCG tablet Take 1 tablet (1,000 mcg total) by mouth daily. 04/12/20   Mack Hook, MD    Allergies    Patient has no known allergies.  Review of Systems   Review of Systems  Constitutional: Negative for fever.  HENT: Negative for sore throat.   Respiratory: Positive for cough. Negative for shortness of breath.   Cardiovascular: Negative for chest pain.  Musculoskeletal: Negative for myalgias.  Neurological: Negative for headaches.    Physical Exam Updated Vital Signs BP (!) 156/83 (BP Location: Right Arm)   Pulse 68   Temp 98.6 F (37 C) (Oral)   Resp (!) 24   Ht 5\' 2"  (1.575 m)   Wt 59 kg   SpO2 92%   BMI 23.78 kg/m   Physical Exam Vitals and nursing note reviewed.  Constitutional:      General: She is not in acute distress.    Appearance: Normal appearance. She is well-developed and well-nourished.  HENT:     Head: Normocephalic and atraumatic.     Mouth/Throat:     Mouth: Oropharynx is clear and moist.  Eyes:     Conjunctiva/sclera: Conjunctivae normal.  Cardiovascular:     Rate and Rhythm: Normal rate and regular rhythm.     Pulses: Intact distal pulses.     Heart sounds: No murmur heard.   Pulmonary:     Effort: Pulmonary effort is normal. No respiratory distress.     Breath sounds: No stridor. Rhonchi present. No wheezing.  Abdominal:     Palpations: Abdomen is soft.     Tenderness:  There is no abdominal tenderness.  Musculoskeletal:        General: No tenderness or edema. Normal range of motion.     Cervical back: Neck supple.  Skin:    General: Skin is warm and dry.  Neurological:     General: No focal deficit present.     Mental Status: She is alert. Mental status  is at baseline.     GCS: GCS eye subscore is 4. GCS verbal subscore is 5. GCS motor subscore is 6.  Psychiatric:        Mood and Affect: Mood and affect normal.     ED Results / Procedures / Treatments   Labs (all labs ordered are listed, but only abnormal results are displayed) Labs Reviewed  BASIC METABOLIC PANEL - Abnormal; Notable for the following components:      Result Value   Potassium 3.2 (*)    All other components within normal limits  CBC - Abnormal; Notable for the following components:   Hemoglobin 11.7 (*)    All other components within normal limits    EKG None  Radiology DG Chest Port 1 View  Result Date: 11/12/2020 CLINICAL DATA:  COVID-19 positive today.  Hypoxia. EXAM: PORTABLE CHEST 1 VIEW COMPARISON:  04/17/2020 FINDINGS: Patient is rotated to the right. Lungs are hypoinflated without focal airspace consolidation or effusion. Cardiomediastinal silhouette and remainder of the exam is unchanged. IMPRESSION: Hypoinflation without acute cardiopulmonary disease. Electronically Signed   By: Marin Olp M.D.   On: 11/12/2020 17:10    Procedures Procedures (including critical care time)  Medications Ordered in ED Medications - No data to display  ED Course  I have reviewed the triage vital signs and the nursing notes.  Pertinent labs & imaging results that were available during my care of the patient were reviewed by me and considered in my medical decision making (see chart for details).  Clinical Course as of 11/13/20 3295  Sat Nov 12, 2020  1942 Patient is mildly tachypneic but satting in the mid to high 90s on room air.  Currently no indication for admission.   Chest x-ray does not show any gross infiltrates. [MB]  2017 We will have her return to facility and have them continue to monitor her.  Have placed a referral into antibody clinic. [MB]    Clinical Course User Index [MB] Hayden Rasmussen, MD   MDM Rules/Calculators/A&P                         Diane Snow was evaluated in Emergency Department on 11/12/2020 for the symptoms described in the history of present illness. She was evaluated in the context of the global COVID-19 pandemic, which necessitated consideration that the patient might be at risk for infection with the SARS-CoV-2 virus that causes COVID-19. Institutional protocols and algorithms that pertain to the evaluation of patients at risk for COVID-19 are in a state of rapid change based on information released by regulatory bodies including the CDC and federal and state organizations. These policies and algorithms were followed during the patient's care in the ED.  This patient complains of COVID-positive, cough; this involves an extensive number of treatment Options and is a complaint that carries with it a high risk of complications and Morbidity. The differential includes COVID, COVID-pneumonia, hypoxia, respiratory distress, pneumonia or pneumothorax  I ordered, reviewed and interpreted labs, which included CBC with normal white count, stable hemoglobin, chemistries fairly normal other than mildly low potassium I ordered imaging studies which included chest x-ray and I independently    visualized and interpreted imaging which showed no acute infiltrates  Previous records obtained and reviewed in epic, no recent admissions  After the interventions stated above, I reevaluated the patient and found patient in no distress.  She denies any complaints.  She has some rhonchi on exam  although satting well on room air.  No indications for admission at this time.  As the patient is at a facility and well monitored feels she can return  there where they can continue to observe her symptoms.  I have placed a referral into the monoclonal antibody clinic.  Return instructions discussed  Final Clinical Impression(s) / ED Diagnoses Final diagnoses:  COVID-19 virus infection    Rx / DC Orders ED Discharge Orders    None       Hayden Rasmussen, MD 11/13/20 1001

## 2020-11-12 NOTE — ED Notes (Signed)
[facility] called for patient transport 

## 2020-11-12 NOTE — Discharge Instructions (Addendum)
You were seen in the emergency department for evaluation of COVID and low oxygen level.  Your oxygen levels were stable here and your chest x-ray did not show any obvious pneumonia.  You currently do not need to be admitted to the hospital for your COVID.  Your symptoms will need to be monitored at your facility.  If you worsen please return to the emergency department.  We placed a referral into the monoclonal antibody clinic and they may contact you.

## 2020-11-12 NOTE — ED Notes (Signed)
[facility] called and message left about patients arrival back to facility.

## 2020-11-12 NOTE — ED Triage Notes (Signed)
Per EMS, patient from [facility], Covid+ today, initially hypoxic, placed on 6L [place] and NRB with rhonchi. After x1 hour of coughing patient oxygen saturation improved to 100% on RA. Patient A&Ox4.  18g L FA

## 2020-11-14 ENCOUNTER — Telehealth (HOSPITAL_COMMUNITY): Payer: Self-pay | Admitting: Family

## 2020-11-14 NOTE — Telephone Encounter (Signed)
Called to discuss with patient about COVID-19 symptoms and the use of one of the available treatments for those with mild to moderate Covid symptoms and at a high risk of hospitalization.  Pt appears to qualify for outpatient treatment due to co-morbid conditions and/or a member of an at-risk group in accordance with the FDA Emergency Use Authorization.    Unable to reach pt and unable to leave VM as box was full.    [employer]

## 2020-11-16 DIAGNOSIS — M1711 Unilateral primary osteoarthritis, right knee: Secondary | ICD-10-CM | POA: Diagnosis not present

## 2020-11-16 DIAGNOSIS — M1712 Unilateral primary osteoarthritis, left knee: Secondary | ICD-10-CM | POA: Diagnosis not present

## 2020-11-16 DIAGNOSIS — I10 Essential (primary) hypertension: Secondary | ICD-10-CM | POA: Diagnosis not present

## 2020-11-16 DIAGNOSIS — M6282 Rhabdomyolysis: Secondary | ICD-10-CM | POA: Diagnosis not present

## 2020-11-18 DIAGNOSIS — I10 Essential (primary) hypertension: Secondary | ICD-10-CM | POA: Diagnosis not present

## 2020-11-18 DIAGNOSIS — M1711 Unilateral primary osteoarthritis, right knee: Secondary | ICD-10-CM | POA: Diagnosis not present

## 2020-11-18 DIAGNOSIS — M48061 Spinal stenosis, lumbar region without neurogenic claudication: Secondary | ICD-10-CM | POA: Diagnosis not present

## 2020-11-18 DIAGNOSIS — F0390 Unspecified dementia without behavioral disturbance: Secondary | ICD-10-CM | POA: Diagnosis not present

## 2020-11-18 DIAGNOSIS — E46 Unspecified protein-calorie malnutrition: Secondary | ICD-10-CM | POA: Diagnosis not present

## 2020-11-18 DIAGNOSIS — R29898 Other symptoms and signs involving the musculoskeletal system: Secondary | ICD-10-CM | POA: Diagnosis not present

## 2020-11-18 DIAGNOSIS — M1712 Unilateral primary osteoarthritis, left knee: Secondary | ICD-10-CM | POA: Diagnosis not present

## 2020-11-18 DIAGNOSIS — M6282 Rhabdomyolysis: Secondary | ICD-10-CM | POA: Diagnosis not present

## 2020-11-18 DIAGNOSIS — L89159 Pressure ulcer of sacral region, unspecified stage: Secondary | ICD-10-CM | POA: Diagnosis not present

## 2020-11-19 DIAGNOSIS — M1712 Unilateral primary osteoarthritis, left knee: Secondary | ICD-10-CM | POA: Diagnosis not present

## 2020-11-19 DIAGNOSIS — M6282 Rhabdomyolysis: Secondary | ICD-10-CM | POA: Diagnosis not present

## 2020-11-19 DIAGNOSIS — I10 Essential (primary) hypertension: Secondary | ICD-10-CM | POA: Diagnosis not present

## 2020-11-19 DIAGNOSIS — M1711 Unilateral primary osteoarthritis, right knee: Secondary | ICD-10-CM | POA: Diagnosis not present

## 2020-11-22 DIAGNOSIS — I10 Essential (primary) hypertension: Secondary | ICD-10-CM | POA: Diagnosis not present

## 2020-11-22 DIAGNOSIS — M1711 Unilateral primary osteoarthritis, right knee: Secondary | ICD-10-CM | POA: Diagnosis not present

## 2020-11-22 DIAGNOSIS — M6282 Rhabdomyolysis: Secondary | ICD-10-CM | POA: Diagnosis not present

## 2020-11-22 DIAGNOSIS — M1712 Unilateral primary osteoarthritis, left knee: Secondary | ICD-10-CM | POA: Diagnosis not present

## 2020-11-23 DIAGNOSIS — I10 Essential (primary) hypertension: Secondary | ICD-10-CM | POA: Diagnosis not present

## 2020-11-23 DIAGNOSIS — M6282 Rhabdomyolysis: Secondary | ICD-10-CM | POA: Diagnosis not present

## 2020-11-23 DIAGNOSIS — M1712 Unilateral primary osteoarthritis, left knee: Secondary | ICD-10-CM | POA: Diagnosis not present

## 2020-11-23 DIAGNOSIS — M1711 Unilateral primary osteoarthritis, right knee: Secondary | ICD-10-CM | POA: Diagnosis not present

## 2020-11-24 DIAGNOSIS — M6282 Rhabdomyolysis: Secondary | ICD-10-CM | POA: Diagnosis not present

## 2020-11-24 DIAGNOSIS — M1712 Unilateral primary osteoarthritis, left knee: Secondary | ICD-10-CM | POA: Diagnosis not present

## 2020-11-24 DIAGNOSIS — M1711 Unilateral primary osteoarthritis, right knee: Secondary | ICD-10-CM | POA: Diagnosis not present

## 2020-11-24 DIAGNOSIS — I10 Essential (primary) hypertension: Secondary | ICD-10-CM | POA: Diagnosis not present

## 2020-11-29 DIAGNOSIS — F331 Major depressive disorder, recurrent, moderate: Secondary | ICD-10-CM | POA: Diagnosis not present

## 2020-11-29 DIAGNOSIS — M6282 Rhabdomyolysis: Secondary | ICD-10-CM | POA: Diagnosis not present

## 2020-11-29 DIAGNOSIS — G4700 Insomnia, unspecified: Secondary | ICD-10-CM | POA: Diagnosis not present

## 2020-11-29 DIAGNOSIS — M1712 Unilateral primary osteoarthritis, left knee: Secondary | ICD-10-CM | POA: Diagnosis not present

## 2020-11-29 DIAGNOSIS — I10 Essential (primary) hypertension: Secondary | ICD-10-CM | POA: Diagnosis not present

## 2020-11-29 DIAGNOSIS — F419 Anxiety disorder, unspecified: Secondary | ICD-10-CM | POA: Diagnosis not present

## 2020-11-29 DIAGNOSIS — M1711 Unilateral primary osteoarthritis, right knee: Secondary | ICD-10-CM | POA: Diagnosis not present

## 2020-11-30 DIAGNOSIS — M6281 Muscle weakness (generalized): Secondary | ICD-10-CM | POA: Diagnosis not present

## 2020-11-30 DIAGNOSIS — I10 Essential (primary) hypertension: Secondary | ICD-10-CM | POA: Diagnosis not present

## 2020-11-30 DIAGNOSIS — L8992 Pressure ulcer of unspecified site, stage 2: Secondary | ICD-10-CM | POA: Diagnosis not present

## 2020-11-30 DIAGNOSIS — M6282 Rhabdomyolysis: Secondary | ICD-10-CM | POA: Diagnosis not present

## 2020-11-30 DIAGNOSIS — M1711 Unilateral primary osteoarthritis, right knee: Secondary | ICD-10-CM | POA: Diagnosis not present

## 2020-11-30 DIAGNOSIS — R2689 Other abnormalities of gait and mobility: Secondary | ICD-10-CM | POA: Diagnosis not present

## 2020-11-30 DIAGNOSIS — M1712 Unilateral primary osteoarthritis, left knee: Secondary | ICD-10-CM | POA: Diagnosis not present

## 2020-11-30 DIAGNOSIS — E46 Unspecified protein-calorie malnutrition: Secondary | ICD-10-CM | POA: Diagnosis not present

## 2020-12-01 DIAGNOSIS — M6282 Rhabdomyolysis: Secondary | ICD-10-CM | POA: Diagnosis not present

## 2020-12-01 DIAGNOSIS — M1712 Unilateral primary osteoarthritis, left knee: Secondary | ICD-10-CM | POA: Diagnosis not present

## 2020-12-01 DIAGNOSIS — M1711 Unilateral primary osteoarthritis, right knee: Secondary | ICD-10-CM | POA: Diagnosis not present

## 2020-12-01 DIAGNOSIS — I10 Essential (primary) hypertension: Secondary | ICD-10-CM | POA: Diagnosis not present

## 2020-12-07 DIAGNOSIS — M1711 Unilateral primary osteoarthritis, right knee: Secondary | ICD-10-CM | POA: Diagnosis not present

## 2020-12-07 DIAGNOSIS — M1712 Unilateral primary osteoarthritis, left knee: Secondary | ICD-10-CM | POA: Diagnosis not present

## 2020-12-07 DIAGNOSIS — M6282 Rhabdomyolysis: Secondary | ICD-10-CM | POA: Diagnosis not present

## 2020-12-07 DIAGNOSIS — I10 Essential (primary) hypertension: Secondary | ICD-10-CM | POA: Diagnosis not present

## 2020-12-08 DIAGNOSIS — I10 Essential (primary) hypertension: Secondary | ICD-10-CM | POA: Diagnosis not present

## 2020-12-08 DIAGNOSIS — M1711 Unilateral primary osteoarthritis, right knee: Secondary | ICD-10-CM | POA: Diagnosis not present

## 2020-12-08 DIAGNOSIS — M6282 Rhabdomyolysis: Secondary | ICD-10-CM | POA: Diagnosis not present

## 2020-12-08 DIAGNOSIS — M1712 Unilateral primary osteoarthritis, left knee: Secondary | ICD-10-CM | POA: Diagnosis not present

## 2020-12-09 DIAGNOSIS — I10 Essential (primary) hypertension: Secondary | ICD-10-CM | POA: Diagnosis not present

## 2020-12-09 DIAGNOSIS — M6282 Rhabdomyolysis: Secondary | ICD-10-CM | POA: Diagnosis not present

## 2020-12-09 DIAGNOSIS — M1712 Unilateral primary osteoarthritis, left knee: Secondary | ICD-10-CM | POA: Diagnosis not present

## 2020-12-09 DIAGNOSIS — M1711 Unilateral primary osteoarthritis, right knee: Secondary | ICD-10-CM | POA: Diagnosis not present

## 2020-12-13 DIAGNOSIS — M1712 Unilateral primary osteoarthritis, left knee: Secondary | ICD-10-CM | POA: Diagnosis not present

## 2020-12-13 DIAGNOSIS — M6282 Rhabdomyolysis: Secondary | ICD-10-CM | POA: Diagnosis not present

## 2020-12-13 DIAGNOSIS — M1711 Unilateral primary osteoarthritis, right knee: Secondary | ICD-10-CM | POA: Diagnosis not present

## 2020-12-13 DIAGNOSIS — I10 Essential (primary) hypertension: Secondary | ICD-10-CM | POA: Diagnosis not present

## 2020-12-14 DIAGNOSIS — M6282 Rhabdomyolysis: Secondary | ICD-10-CM | POA: Diagnosis not present

## 2020-12-14 DIAGNOSIS — I10 Essential (primary) hypertension: Secondary | ICD-10-CM | POA: Diagnosis not present

## 2020-12-14 DIAGNOSIS — M1712 Unilateral primary osteoarthritis, left knee: Secondary | ICD-10-CM | POA: Diagnosis not present

## 2020-12-14 DIAGNOSIS — M1711 Unilateral primary osteoarthritis, right knee: Secondary | ICD-10-CM | POA: Diagnosis not present

## 2020-12-15 DIAGNOSIS — L89323 Pressure ulcer of left buttock, stage 3: Secondary | ICD-10-CM | POA: Diagnosis not present

## 2020-12-16 DIAGNOSIS — E46 Unspecified protein-calorie malnutrition: Secondary | ICD-10-CM | POA: Diagnosis not present

## 2020-12-16 DIAGNOSIS — I10 Essential (primary) hypertension: Secondary | ICD-10-CM | POA: Diagnosis not present

## 2020-12-16 DIAGNOSIS — R29898 Other symptoms and signs involving the musculoskeletal system: Secondary | ICD-10-CM | POA: Diagnosis not present

## 2020-12-16 DIAGNOSIS — L89159 Pressure ulcer of sacral region, unspecified stage: Secondary | ICD-10-CM | POA: Diagnosis not present

## 2020-12-16 DIAGNOSIS — F0390 Unspecified dementia without behavioral disturbance: Secondary | ICD-10-CM | POA: Diagnosis not present

## 2020-12-16 DIAGNOSIS — M48061 Spinal stenosis, lumbar region without neurogenic claudication: Secondary | ICD-10-CM | POA: Diagnosis not present

## 2020-12-19 DIAGNOSIS — M1711 Unilateral primary osteoarthritis, right knee: Secondary | ICD-10-CM | POA: Diagnosis not present

## 2020-12-19 DIAGNOSIS — I10 Essential (primary) hypertension: Secondary | ICD-10-CM | POA: Diagnosis not present

## 2020-12-19 DIAGNOSIS — M6282 Rhabdomyolysis: Secondary | ICD-10-CM | POA: Diagnosis not present

## 2020-12-19 DIAGNOSIS — M1712 Unilateral primary osteoarthritis, left knee: Secondary | ICD-10-CM | POA: Diagnosis not present

## 2020-12-20 DIAGNOSIS — M1712 Unilateral primary osteoarthritis, left knee: Secondary | ICD-10-CM | POA: Diagnosis not present

## 2020-12-20 DIAGNOSIS — M1711 Unilateral primary osteoarthritis, right knee: Secondary | ICD-10-CM | POA: Diagnosis not present

## 2020-12-20 DIAGNOSIS — M6282 Rhabdomyolysis: Secondary | ICD-10-CM | POA: Diagnosis not present

## 2020-12-20 DIAGNOSIS — I10 Essential (primary) hypertension: Secondary | ICD-10-CM | POA: Diagnosis not present

## 2020-12-22 DIAGNOSIS — W06XXXD Fall from bed, subsequent encounter: Secondary | ICD-10-CM | POA: Diagnosis not present

## 2020-12-22 DIAGNOSIS — E46 Unspecified protein-calorie malnutrition: Secondary | ICD-10-CM | POA: Diagnosis not present

## 2020-12-22 DIAGNOSIS — M6282 Rhabdomyolysis: Secondary | ICD-10-CM | POA: Diagnosis not present

## 2020-12-22 DIAGNOSIS — M25551 Pain in right hip: Secondary | ICD-10-CM | POA: Diagnosis not present

## 2020-12-22 DIAGNOSIS — G250 Essential tremor: Secondary | ICD-10-CM | POA: Diagnosis not present

## 2020-12-22 DIAGNOSIS — L89323 Pressure ulcer of left buttock, stage 3: Secondary | ICD-10-CM | POA: Diagnosis not present

## 2020-12-22 DIAGNOSIS — M6281 Muscle weakness (generalized): Secondary | ICD-10-CM | POA: Diagnosis not present

## 2020-12-22 DIAGNOSIS — M1711 Unilateral primary osteoarthritis, right knee: Secondary | ICD-10-CM | POA: Diagnosis not present

## 2020-12-22 DIAGNOSIS — M1712 Unilateral primary osteoarthritis, left knee: Secondary | ICD-10-CM | POA: Diagnosis not present

## 2020-12-22 DIAGNOSIS — I10 Essential (primary) hypertension: Secondary | ICD-10-CM | POA: Diagnosis not present

## 2020-12-24 DIAGNOSIS — M1712 Unilateral primary osteoarthritis, left knee: Secondary | ICD-10-CM | POA: Diagnosis not present

## 2020-12-24 DIAGNOSIS — M6282 Rhabdomyolysis: Secondary | ICD-10-CM | POA: Diagnosis not present

## 2020-12-24 DIAGNOSIS — M1711 Unilateral primary osteoarthritis, right knee: Secondary | ICD-10-CM | POA: Diagnosis not present

## 2020-12-24 DIAGNOSIS — I10 Essential (primary) hypertension: Secondary | ICD-10-CM | POA: Diagnosis not present

## 2020-12-27 DIAGNOSIS — I10 Essential (primary) hypertension: Secondary | ICD-10-CM | POA: Diagnosis not present

## 2020-12-27 DIAGNOSIS — M1711 Unilateral primary osteoarthritis, right knee: Secondary | ICD-10-CM | POA: Diagnosis not present

## 2020-12-27 DIAGNOSIS — M1712 Unilateral primary osteoarthritis, left knee: Secondary | ICD-10-CM | POA: Diagnosis not present

## 2020-12-27 DIAGNOSIS — M6282 Rhabdomyolysis: Secondary | ICD-10-CM | POA: Diagnosis not present

## 2020-12-28 DIAGNOSIS — M1711 Unilateral primary osteoarthritis, right knee: Secondary | ICD-10-CM | POA: Diagnosis not present

## 2020-12-28 DIAGNOSIS — G250 Essential tremor: Secondary | ICD-10-CM | POA: Diagnosis not present

## 2020-12-28 DIAGNOSIS — M6281 Muscle weakness (generalized): Secondary | ICD-10-CM | POA: Diagnosis not present

## 2020-12-28 DIAGNOSIS — M1712 Unilateral primary osteoarthritis, left knee: Secondary | ICD-10-CM | POA: Diagnosis not present

## 2020-12-28 DIAGNOSIS — M6282 Rhabdomyolysis: Secondary | ICD-10-CM | POA: Diagnosis not present

## 2020-12-28 DIAGNOSIS — R2689 Other abnormalities of gait and mobility: Secondary | ICD-10-CM | POA: Diagnosis not present

## 2020-12-28 DIAGNOSIS — I10 Essential (primary) hypertension: Secondary | ICD-10-CM | POA: Diagnosis not present

## 2020-12-29 DIAGNOSIS — I10 Essential (primary) hypertension: Secondary | ICD-10-CM | POA: Diagnosis not present

## 2020-12-29 DIAGNOSIS — L89323 Pressure ulcer of left buttock, stage 3: Secondary | ICD-10-CM | POA: Diagnosis not present

## 2020-12-29 DIAGNOSIS — M6282 Rhabdomyolysis: Secondary | ICD-10-CM | POA: Diagnosis not present

## 2020-12-29 DIAGNOSIS — M1711 Unilateral primary osteoarthritis, right knee: Secondary | ICD-10-CM | POA: Diagnosis not present

## 2020-12-29 DIAGNOSIS — M1712 Unilateral primary osteoarthritis, left knee: Secondary | ICD-10-CM | POA: Diagnosis not present

## 2020-12-30 DIAGNOSIS — M6282 Rhabdomyolysis: Secondary | ICD-10-CM | POA: Diagnosis not present

## 2020-12-30 DIAGNOSIS — M1711 Unilateral primary osteoarthritis, right knee: Secondary | ICD-10-CM | POA: Diagnosis not present

## 2020-12-30 DIAGNOSIS — I10 Essential (primary) hypertension: Secondary | ICD-10-CM | POA: Diagnosis not present

## 2020-12-30 DIAGNOSIS — M1712 Unilateral primary osteoarthritis, left knee: Secondary | ICD-10-CM | POA: Diagnosis not present

## 2021-01-03 DIAGNOSIS — M6282 Rhabdomyolysis: Secondary | ICD-10-CM | POA: Diagnosis not present

## 2021-01-03 DIAGNOSIS — M1712 Unilateral primary osteoarthritis, left knee: Secondary | ICD-10-CM | POA: Diagnosis not present

## 2021-01-03 DIAGNOSIS — M1711 Unilateral primary osteoarthritis, right knee: Secondary | ICD-10-CM | POA: Diagnosis not present

## 2021-01-03 DIAGNOSIS — I10 Essential (primary) hypertension: Secondary | ICD-10-CM | POA: Diagnosis not present

## 2021-01-04 DIAGNOSIS — M1711 Unilateral primary osteoarthritis, right knee: Secondary | ICD-10-CM | POA: Diagnosis not present

## 2021-01-04 DIAGNOSIS — M1712 Unilateral primary osteoarthritis, left knee: Secondary | ICD-10-CM | POA: Diagnosis not present

## 2021-01-04 DIAGNOSIS — M6282 Rhabdomyolysis: Secondary | ICD-10-CM | POA: Diagnosis not present

## 2021-01-04 DIAGNOSIS — I10 Essential (primary) hypertension: Secondary | ICD-10-CM | POA: Diagnosis not present

## 2021-01-05 DIAGNOSIS — L89323 Pressure ulcer of left buttock, stage 3: Secondary | ICD-10-CM | POA: Diagnosis not present

## 2021-01-12 DIAGNOSIS — L89323 Pressure ulcer of left buttock, stage 3: Secondary | ICD-10-CM | POA: Diagnosis not present

## 2021-01-14 DIAGNOSIS — R29898 Other symptoms and signs involving the musculoskeletal system: Secondary | ICD-10-CM | POA: Diagnosis not present

## 2021-01-14 DIAGNOSIS — F0390 Unspecified dementia without behavioral disturbance: Secondary | ICD-10-CM | POA: Diagnosis not present

## 2021-01-14 DIAGNOSIS — L89159 Pressure ulcer of sacral region, unspecified stage: Secondary | ICD-10-CM | POA: Diagnosis not present

## 2021-01-14 DIAGNOSIS — I10 Essential (primary) hypertension: Secondary | ICD-10-CM | POA: Diagnosis not present

## 2021-01-14 DIAGNOSIS — M62459 Contracture of muscle, unspecified thigh: Secondary | ICD-10-CM | POA: Diagnosis not present

## 2021-01-14 DIAGNOSIS — M48061 Spinal stenosis, lumbar region without neurogenic claudication: Secondary | ICD-10-CM | POA: Diagnosis not present

## 2021-01-14 DIAGNOSIS — E46 Unspecified protein-calorie malnutrition: Secondary | ICD-10-CM | POA: Diagnosis not present

## 2021-01-16 ENCOUNTER — Ambulatory Visit: Payer: Medicare HMO | Admitting: Neurology

## 2021-01-16 ENCOUNTER — Telehealth: Payer: Self-pay | Admitting: Neurology

## 2021-01-16 NOTE — Telephone Encounter (Signed)
Pt's sister-in-law, Diane Snow cancelled appt for today. Diane Snow, Diane Snow this a notice short she will not be able to come. I found out Friday she had an appt,  Diane Snow did not want to reschedule.

## 2021-01-19 DIAGNOSIS — L89323 Pressure ulcer of left buttock, stage 3: Secondary | ICD-10-CM | POA: Diagnosis not present

## 2021-01-23 DIAGNOSIS — M1711 Unilateral primary osteoarthritis, right knee: Secondary | ICD-10-CM | POA: Diagnosis not present

## 2021-01-23 DIAGNOSIS — M1712 Unilateral primary osteoarthritis, left knee: Secondary | ICD-10-CM | POA: Diagnosis not present

## 2021-01-23 DIAGNOSIS — I10 Essential (primary) hypertension: Secondary | ICD-10-CM | POA: Diagnosis not present

## 2021-01-23 DIAGNOSIS — M6282 Rhabdomyolysis: Secondary | ICD-10-CM | POA: Diagnosis not present

## 2021-01-24 DIAGNOSIS — M1711 Unilateral primary osteoarthritis, right knee: Secondary | ICD-10-CM | POA: Diagnosis not present

## 2021-01-24 DIAGNOSIS — M6282 Rhabdomyolysis: Secondary | ICD-10-CM | POA: Diagnosis not present

## 2021-01-24 DIAGNOSIS — I10 Essential (primary) hypertension: Secondary | ICD-10-CM | POA: Diagnosis not present

## 2021-01-24 DIAGNOSIS — M1712 Unilateral primary osteoarthritis, left knee: Secondary | ICD-10-CM | POA: Diagnosis not present

## 2021-01-26 DIAGNOSIS — M6282 Rhabdomyolysis: Secondary | ICD-10-CM | POA: Diagnosis not present

## 2021-01-26 DIAGNOSIS — F331 Major depressive disorder, recurrent, moderate: Secondary | ICD-10-CM | POA: Diagnosis not present

## 2021-01-26 DIAGNOSIS — G4700 Insomnia, unspecified: Secondary | ICD-10-CM | POA: Diagnosis not present

## 2021-01-26 DIAGNOSIS — F419 Anxiety disorder, unspecified: Secondary | ICD-10-CM | POA: Diagnosis not present

## 2021-01-26 DIAGNOSIS — I10 Essential (primary) hypertension: Secondary | ICD-10-CM | POA: Diagnosis not present

## 2021-01-26 DIAGNOSIS — M1711 Unilateral primary osteoarthritis, right knee: Secondary | ICD-10-CM | POA: Diagnosis not present

## 2021-01-26 DIAGNOSIS — M1712 Unilateral primary osteoarthritis, left knee: Secondary | ICD-10-CM | POA: Diagnosis not present

## 2021-01-26 DIAGNOSIS — L89323 Pressure ulcer of left buttock, stage 3: Secondary | ICD-10-CM | POA: Diagnosis not present

## 2021-01-27 DIAGNOSIS — Z741 Need for assistance with personal care: Secondary | ICD-10-CM | POA: Diagnosis not present

## 2021-01-27 DIAGNOSIS — M6282 Rhabdomyolysis: Secondary | ICD-10-CM | POA: Diagnosis not present

## 2021-01-27 DIAGNOSIS — I10 Essential (primary) hypertension: Secondary | ICD-10-CM | POA: Diagnosis not present

## 2021-01-27 DIAGNOSIS — M6281 Muscle weakness (generalized): Secondary | ICD-10-CM | POA: Diagnosis not present

## 2021-01-27 DIAGNOSIS — M1711 Unilateral primary osteoarthritis, right knee: Secondary | ICD-10-CM | POA: Diagnosis not present

## 2021-01-27 DIAGNOSIS — M1712 Unilateral primary osteoarthritis, left knee: Secondary | ICD-10-CM | POA: Diagnosis not present

## 2021-01-29 DIAGNOSIS — M6281 Muscle weakness (generalized): Secondary | ICD-10-CM | POA: Diagnosis not present

## 2021-01-29 DIAGNOSIS — I10 Essential (primary) hypertension: Secondary | ICD-10-CM | POA: Diagnosis not present

## 2021-01-29 DIAGNOSIS — M6282 Rhabdomyolysis: Secondary | ICD-10-CM | POA: Diagnosis not present

## 2021-01-29 DIAGNOSIS — M1711 Unilateral primary osteoarthritis, right knee: Secondary | ICD-10-CM | POA: Diagnosis not present

## 2021-01-29 DIAGNOSIS — Z741 Need for assistance with personal care: Secondary | ICD-10-CM | POA: Diagnosis not present

## 2021-01-29 DIAGNOSIS — M1712 Unilateral primary osteoarthritis, left knee: Secondary | ICD-10-CM | POA: Diagnosis not present

## 2021-01-31 DIAGNOSIS — M6281 Muscle weakness (generalized): Secondary | ICD-10-CM | POA: Diagnosis not present

## 2021-01-31 DIAGNOSIS — M6282 Rhabdomyolysis: Secondary | ICD-10-CM | POA: Diagnosis not present

## 2021-01-31 DIAGNOSIS — I10 Essential (primary) hypertension: Secondary | ICD-10-CM | POA: Diagnosis not present

## 2021-01-31 DIAGNOSIS — M1712 Unilateral primary osteoarthritis, left knee: Secondary | ICD-10-CM | POA: Diagnosis not present

## 2021-01-31 DIAGNOSIS — M1711 Unilateral primary osteoarthritis, right knee: Secondary | ICD-10-CM | POA: Diagnosis not present

## 2021-01-31 DIAGNOSIS — Z741 Need for assistance with personal care: Secondary | ICD-10-CM | POA: Diagnosis not present

## 2021-02-02 DIAGNOSIS — Z741 Need for assistance with personal care: Secondary | ICD-10-CM | POA: Diagnosis not present

## 2021-02-02 DIAGNOSIS — M6282 Rhabdomyolysis: Secondary | ICD-10-CM | POA: Diagnosis not present

## 2021-02-02 DIAGNOSIS — M6281 Muscle weakness (generalized): Secondary | ICD-10-CM | POA: Diagnosis not present

## 2021-02-02 DIAGNOSIS — M1712 Unilateral primary osteoarthritis, left knee: Secondary | ICD-10-CM | POA: Diagnosis not present

## 2021-02-02 DIAGNOSIS — M1711 Unilateral primary osteoarthritis, right knee: Secondary | ICD-10-CM | POA: Diagnosis not present

## 2021-02-02 DIAGNOSIS — I10 Essential (primary) hypertension: Secondary | ICD-10-CM | POA: Diagnosis not present

## 2021-02-03 DIAGNOSIS — M6281 Muscle weakness (generalized): Secondary | ICD-10-CM | POA: Diagnosis not present

## 2021-02-03 DIAGNOSIS — M1712 Unilateral primary osteoarthritis, left knee: Secondary | ICD-10-CM | POA: Diagnosis not present

## 2021-02-03 DIAGNOSIS — F331 Major depressive disorder, recurrent, moderate: Secondary | ICD-10-CM | POA: Diagnosis not present

## 2021-02-03 DIAGNOSIS — Z741 Need for assistance with personal care: Secondary | ICD-10-CM | POA: Diagnosis not present

## 2021-02-03 DIAGNOSIS — I10 Essential (primary) hypertension: Secondary | ICD-10-CM | POA: Diagnosis not present

## 2021-02-03 DIAGNOSIS — M1711 Unilateral primary osteoarthritis, right knee: Secondary | ICD-10-CM | POA: Diagnosis not present

## 2021-02-03 DIAGNOSIS — M6282 Rhabdomyolysis: Secondary | ICD-10-CM | POA: Diagnosis not present

## 2021-02-06 DIAGNOSIS — M6281 Muscle weakness (generalized): Secondary | ICD-10-CM | POA: Diagnosis not present

## 2021-02-06 DIAGNOSIS — I10 Essential (primary) hypertension: Secondary | ICD-10-CM | POA: Diagnosis not present

## 2021-02-06 DIAGNOSIS — M6282 Rhabdomyolysis: Secondary | ICD-10-CM | POA: Diagnosis not present

## 2021-02-06 DIAGNOSIS — Z741 Need for assistance with personal care: Secondary | ICD-10-CM | POA: Diagnosis not present

## 2021-02-06 DIAGNOSIS — M1712 Unilateral primary osteoarthritis, left knee: Secondary | ICD-10-CM | POA: Diagnosis not present

## 2021-02-06 DIAGNOSIS — M1711 Unilateral primary osteoarthritis, right knee: Secondary | ICD-10-CM | POA: Diagnosis not present

## 2021-02-07 DIAGNOSIS — M6281 Muscle weakness (generalized): Secondary | ICD-10-CM | POA: Diagnosis not present

## 2021-02-07 DIAGNOSIS — M1711 Unilateral primary osteoarthritis, right knee: Secondary | ICD-10-CM | POA: Diagnosis not present

## 2021-02-07 DIAGNOSIS — Z741 Need for assistance with personal care: Secondary | ICD-10-CM | POA: Diagnosis not present

## 2021-02-07 DIAGNOSIS — M6282 Rhabdomyolysis: Secondary | ICD-10-CM | POA: Diagnosis not present

## 2021-02-07 DIAGNOSIS — I10 Essential (primary) hypertension: Secondary | ICD-10-CM | POA: Diagnosis not present

## 2021-02-07 DIAGNOSIS — M1712 Unilateral primary osteoarthritis, left knee: Secondary | ICD-10-CM | POA: Diagnosis not present

## 2021-02-09 DIAGNOSIS — Z741 Need for assistance with personal care: Secondary | ICD-10-CM | POA: Diagnosis not present

## 2021-02-09 DIAGNOSIS — I10 Essential (primary) hypertension: Secondary | ICD-10-CM | POA: Diagnosis not present

## 2021-02-09 DIAGNOSIS — M1711 Unilateral primary osteoarthritis, right knee: Secondary | ICD-10-CM | POA: Diagnosis not present

## 2021-02-09 DIAGNOSIS — M6281 Muscle weakness (generalized): Secondary | ICD-10-CM | POA: Diagnosis not present

## 2021-02-09 DIAGNOSIS — M6282 Rhabdomyolysis: Secondary | ICD-10-CM | POA: Diagnosis not present

## 2021-02-09 DIAGNOSIS — M1712 Unilateral primary osteoarthritis, left knee: Secondary | ICD-10-CM | POA: Diagnosis not present

## 2021-02-10 DIAGNOSIS — M1712 Unilateral primary osteoarthritis, left knee: Secondary | ICD-10-CM | POA: Diagnosis not present

## 2021-02-10 DIAGNOSIS — M1711 Unilateral primary osteoarthritis, right knee: Secondary | ICD-10-CM | POA: Diagnosis not present

## 2021-02-10 DIAGNOSIS — L89159 Pressure ulcer of sacral region, unspecified stage: Secondary | ICD-10-CM | POA: Diagnosis not present

## 2021-02-10 DIAGNOSIS — F0390 Unspecified dementia without behavioral disturbance: Secondary | ICD-10-CM | POA: Diagnosis not present

## 2021-02-10 DIAGNOSIS — M6281 Muscle weakness (generalized): Secondary | ICD-10-CM | POA: Diagnosis not present

## 2021-02-10 DIAGNOSIS — M62459 Contracture of muscle, unspecified thigh: Secondary | ICD-10-CM | POA: Diagnosis not present

## 2021-02-10 DIAGNOSIS — I10 Essential (primary) hypertension: Secondary | ICD-10-CM | POA: Diagnosis not present

## 2021-02-10 DIAGNOSIS — R29898 Other symptoms and signs involving the musculoskeletal system: Secondary | ICD-10-CM | POA: Diagnosis not present

## 2021-02-10 DIAGNOSIS — M6282 Rhabdomyolysis: Secondary | ICD-10-CM | POA: Diagnosis not present

## 2021-02-10 DIAGNOSIS — E46 Unspecified protein-calorie malnutrition: Secondary | ICD-10-CM | POA: Diagnosis not present

## 2021-02-10 DIAGNOSIS — M48061 Spinal stenosis, lumbar region without neurogenic claudication: Secondary | ICD-10-CM | POA: Diagnosis not present

## 2021-02-10 DIAGNOSIS — Z741 Need for assistance with personal care: Secondary | ICD-10-CM | POA: Diagnosis not present

## 2021-02-14 DIAGNOSIS — G4700 Insomnia, unspecified: Secondary | ICD-10-CM | POA: Diagnosis not present

## 2021-02-14 DIAGNOSIS — M6281 Muscle weakness (generalized): Secondary | ICD-10-CM | POA: Diagnosis not present

## 2021-02-14 DIAGNOSIS — Z741 Need for assistance with personal care: Secondary | ICD-10-CM | POA: Diagnosis not present

## 2021-02-14 DIAGNOSIS — M6282 Rhabdomyolysis: Secondary | ICD-10-CM | POA: Diagnosis not present

## 2021-02-14 DIAGNOSIS — M1712 Unilateral primary osteoarthritis, left knee: Secondary | ICD-10-CM | POA: Diagnosis not present

## 2021-02-14 DIAGNOSIS — I10 Essential (primary) hypertension: Secondary | ICD-10-CM | POA: Diagnosis not present

## 2021-02-14 DIAGNOSIS — F419 Anxiety disorder, unspecified: Secondary | ICD-10-CM | POA: Diagnosis not present

## 2021-02-14 DIAGNOSIS — F331 Major depressive disorder, recurrent, moderate: Secondary | ICD-10-CM | POA: Diagnosis not present

## 2021-02-14 DIAGNOSIS — M1711 Unilateral primary osteoarthritis, right knee: Secondary | ICD-10-CM | POA: Diagnosis not present

## 2021-02-15 DIAGNOSIS — G20 Parkinson's disease: Secondary | ICD-10-CM | POA: Diagnosis not present

## 2021-02-15 DIAGNOSIS — M1712 Unilateral primary osteoarthritis, left knee: Secondary | ICD-10-CM | POA: Diagnosis not present

## 2021-02-15 DIAGNOSIS — M6281 Muscle weakness (generalized): Secondary | ICD-10-CM | POA: Diagnosis not present

## 2021-02-15 DIAGNOSIS — I10 Essential (primary) hypertension: Secondary | ICD-10-CM | POA: Diagnosis not present

## 2021-02-15 DIAGNOSIS — M1711 Unilateral primary osteoarthritis, right knee: Secondary | ICD-10-CM | POA: Diagnosis not present

## 2021-02-15 DIAGNOSIS — M6282 Rhabdomyolysis: Secondary | ICD-10-CM | POA: Diagnosis not present

## 2021-02-15 DIAGNOSIS — Z741 Need for assistance with personal care: Secondary | ICD-10-CM | POA: Diagnosis not present

## 2021-02-16 DIAGNOSIS — Z741 Need for assistance with personal care: Secondary | ICD-10-CM | POA: Diagnosis not present

## 2021-02-16 DIAGNOSIS — M6281 Muscle weakness (generalized): Secondary | ICD-10-CM | POA: Diagnosis not present

## 2021-02-16 DIAGNOSIS — M1711 Unilateral primary osteoarthritis, right knee: Secondary | ICD-10-CM | POA: Diagnosis not present

## 2021-02-16 DIAGNOSIS — I10 Essential (primary) hypertension: Secondary | ICD-10-CM | POA: Diagnosis not present

## 2021-02-16 DIAGNOSIS — M1712 Unilateral primary osteoarthritis, left knee: Secondary | ICD-10-CM | POA: Diagnosis not present

## 2021-02-16 DIAGNOSIS — M6282 Rhabdomyolysis: Secondary | ICD-10-CM | POA: Diagnosis not present

## 2021-02-17 DIAGNOSIS — Z741 Need for assistance with personal care: Secondary | ICD-10-CM | POA: Diagnosis not present

## 2021-02-17 DIAGNOSIS — M1711 Unilateral primary osteoarthritis, right knee: Secondary | ICD-10-CM | POA: Diagnosis not present

## 2021-02-17 DIAGNOSIS — I10 Essential (primary) hypertension: Secondary | ICD-10-CM | POA: Diagnosis not present

## 2021-02-17 DIAGNOSIS — M1712 Unilateral primary osteoarthritis, left knee: Secondary | ICD-10-CM | POA: Diagnosis not present

## 2021-02-17 DIAGNOSIS — M6281 Muscle weakness (generalized): Secondary | ICD-10-CM | POA: Diagnosis not present

## 2021-02-17 DIAGNOSIS — M6282 Rhabdomyolysis: Secondary | ICD-10-CM | POA: Diagnosis not present

## 2021-02-20 DIAGNOSIS — Z741 Need for assistance with personal care: Secondary | ICD-10-CM | POA: Diagnosis not present

## 2021-02-20 DIAGNOSIS — M6281 Muscle weakness (generalized): Secondary | ICD-10-CM | POA: Diagnosis not present

## 2021-02-20 DIAGNOSIS — M1712 Unilateral primary osteoarthritis, left knee: Secondary | ICD-10-CM | POA: Diagnosis not present

## 2021-02-20 DIAGNOSIS — I10 Essential (primary) hypertension: Secondary | ICD-10-CM | POA: Diagnosis not present

## 2021-02-20 DIAGNOSIS — M6282 Rhabdomyolysis: Secondary | ICD-10-CM | POA: Diagnosis not present

## 2021-02-20 DIAGNOSIS — M1711 Unilateral primary osteoarthritis, right knee: Secondary | ICD-10-CM | POA: Diagnosis not present

## 2021-02-28 DIAGNOSIS — M6281 Muscle weakness (generalized): Secondary | ICD-10-CM | POA: Diagnosis not present

## 2021-02-28 DIAGNOSIS — M25551 Pain in right hip: Secondary | ICD-10-CM | POA: Diagnosis not present

## 2021-02-28 DIAGNOSIS — I10 Essential (primary) hypertension: Secondary | ICD-10-CM | POA: Diagnosis not present

## 2021-02-28 DIAGNOSIS — L8992 Pressure ulcer of unspecified site, stage 2: Secondary | ICD-10-CM | POA: Diagnosis not present

## 2021-03-17 DIAGNOSIS — F0390 Unspecified dementia without behavioral disturbance: Secondary | ICD-10-CM | POA: Diagnosis not present

## 2021-03-17 DIAGNOSIS — R29898 Other symptoms and signs involving the musculoskeletal system: Secondary | ICD-10-CM | POA: Diagnosis not present

## 2021-03-17 DIAGNOSIS — M48061 Spinal stenosis, lumbar region without neurogenic claudication: Secondary | ICD-10-CM | POA: Diagnosis not present

## 2021-03-17 DIAGNOSIS — L89159 Pressure ulcer of sacral region, unspecified stage: Secondary | ICD-10-CM | POA: Diagnosis not present

## 2021-03-17 DIAGNOSIS — M62459 Contracture of muscle, unspecified thigh: Secondary | ICD-10-CM | POA: Diagnosis not present

## 2021-03-17 DIAGNOSIS — I10 Essential (primary) hypertension: Secondary | ICD-10-CM | POA: Diagnosis not present

## 2021-03-17 DIAGNOSIS — E46 Unspecified protein-calorie malnutrition: Secondary | ICD-10-CM | POA: Diagnosis not present

## 2021-03-20 DIAGNOSIS — F331 Major depressive disorder, recurrent, moderate: Secondary | ICD-10-CM | POA: Diagnosis not present

## 2021-03-27 DIAGNOSIS — F331 Major depressive disorder, recurrent, moderate: Secondary | ICD-10-CM | POA: Diagnosis not present

## 2021-03-27 DIAGNOSIS — F419 Anxiety disorder, unspecified: Secondary | ICD-10-CM | POA: Diagnosis not present

## 2021-03-27 DIAGNOSIS — G4700 Insomnia, unspecified: Secondary | ICD-10-CM | POA: Diagnosis not present

## 2021-04-07 DIAGNOSIS — D519 Vitamin B12 deficiency anemia, unspecified: Secondary | ICD-10-CM | POA: Diagnosis not present

## 2021-04-10 DIAGNOSIS — D519 Vitamin B12 deficiency anemia, unspecified: Secondary | ICD-10-CM | POA: Diagnosis not present

## 2021-04-19 DIAGNOSIS — E46 Unspecified protein-calorie malnutrition: Secondary | ICD-10-CM | POA: Diagnosis not present

## 2021-04-19 DIAGNOSIS — G250 Essential tremor: Secondary | ICD-10-CM | POA: Diagnosis not present

## 2021-04-19 DIAGNOSIS — I10 Essential (primary) hypertension: Secondary | ICD-10-CM | POA: Diagnosis not present

## 2021-04-19 DIAGNOSIS — M6281 Muscle weakness (generalized): Secondary | ICD-10-CM | POA: Diagnosis not present

## 2021-04-25 DIAGNOSIS — F419 Anxiety disorder, unspecified: Secondary | ICD-10-CM | POA: Diagnosis not present

## 2021-04-25 DIAGNOSIS — F331 Major depressive disorder, recurrent, moderate: Secondary | ICD-10-CM | POA: Diagnosis not present

## 2021-04-25 DIAGNOSIS — G4700 Insomnia, unspecified: Secondary | ICD-10-CM | POA: Diagnosis not present

## 2021-04-26 DIAGNOSIS — L89159 Pressure ulcer of sacral region, unspecified stage: Secondary | ICD-10-CM | POA: Diagnosis not present

## 2021-04-26 DIAGNOSIS — F0390 Unspecified dementia without behavioral disturbance: Secondary | ICD-10-CM | POA: Diagnosis not present

## 2021-04-26 DIAGNOSIS — I10 Essential (primary) hypertension: Secondary | ICD-10-CM | POA: Diagnosis not present

## 2021-04-26 DIAGNOSIS — E46 Unspecified protein-calorie malnutrition: Secondary | ICD-10-CM | POA: Diagnosis not present

## 2021-04-28 DIAGNOSIS — M6281 Muscle weakness (generalized): Secondary | ICD-10-CM | POA: Diagnosis not present

## 2021-04-28 DIAGNOSIS — M1712 Unilateral primary osteoarthritis, left knee: Secondary | ICD-10-CM | POA: Diagnosis not present

## 2021-04-28 DIAGNOSIS — M1711 Unilateral primary osteoarthritis, right knee: Secondary | ICD-10-CM | POA: Diagnosis not present

## 2021-04-28 DIAGNOSIS — M6282 Rhabdomyolysis: Secondary | ICD-10-CM | POA: Diagnosis not present

## 2021-04-28 DIAGNOSIS — I10 Essential (primary) hypertension: Secondary | ICD-10-CM | POA: Diagnosis not present

## 2021-04-28 DIAGNOSIS — Z741 Need for assistance with personal care: Secondary | ICD-10-CM | POA: Diagnosis not present

## 2021-05-01 DIAGNOSIS — M1711 Unilateral primary osteoarthritis, right knee: Secondary | ICD-10-CM | POA: Diagnosis not present

## 2021-05-01 DIAGNOSIS — M1712 Unilateral primary osteoarthritis, left knee: Secondary | ICD-10-CM | POA: Diagnosis not present

## 2021-05-01 DIAGNOSIS — Z741 Need for assistance with personal care: Secondary | ICD-10-CM | POA: Diagnosis not present

## 2021-05-01 DIAGNOSIS — I10 Essential (primary) hypertension: Secondary | ICD-10-CM | POA: Diagnosis not present

## 2021-05-01 DIAGNOSIS — M6282 Rhabdomyolysis: Secondary | ICD-10-CM | POA: Diagnosis not present

## 2021-05-01 DIAGNOSIS — M6281 Muscle weakness (generalized): Secondary | ICD-10-CM | POA: Diagnosis not present

## 2021-05-02 DIAGNOSIS — M1711 Unilateral primary osteoarthritis, right knee: Secondary | ICD-10-CM | POA: Diagnosis not present

## 2021-05-02 DIAGNOSIS — I10 Essential (primary) hypertension: Secondary | ICD-10-CM | POA: Diagnosis not present

## 2021-05-02 DIAGNOSIS — M1712 Unilateral primary osteoarthritis, left knee: Secondary | ICD-10-CM | POA: Diagnosis not present

## 2021-05-02 DIAGNOSIS — Z741 Need for assistance with personal care: Secondary | ICD-10-CM | POA: Diagnosis not present

## 2021-05-02 DIAGNOSIS — M6282 Rhabdomyolysis: Secondary | ICD-10-CM | POA: Diagnosis not present

## 2021-05-02 DIAGNOSIS — M6281 Muscle weakness (generalized): Secondary | ICD-10-CM | POA: Diagnosis not present

## 2021-05-03 DIAGNOSIS — M1711 Unilateral primary osteoarthritis, right knee: Secondary | ICD-10-CM | POA: Diagnosis not present

## 2021-05-03 DIAGNOSIS — I10 Essential (primary) hypertension: Secondary | ICD-10-CM | POA: Diagnosis not present

## 2021-05-03 DIAGNOSIS — M6282 Rhabdomyolysis: Secondary | ICD-10-CM | POA: Diagnosis not present

## 2021-05-03 DIAGNOSIS — Z741 Need for assistance with personal care: Secondary | ICD-10-CM | POA: Diagnosis not present

## 2021-05-03 DIAGNOSIS — M6281 Muscle weakness (generalized): Secondary | ICD-10-CM | POA: Diagnosis not present

## 2021-05-03 DIAGNOSIS — M1712 Unilateral primary osteoarthritis, left knee: Secondary | ICD-10-CM | POA: Diagnosis not present

## 2021-05-04 DIAGNOSIS — M6282 Rhabdomyolysis: Secondary | ICD-10-CM | POA: Diagnosis not present

## 2021-05-04 DIAGNOSIS — M1711 Unilateral primary osteoarthritis, right knee: Secondary | ICD-10-CM | POA: Diagnosis not present

## 2021-05-04 DIAGNOSIS — I10 Essential (primary) hypertension: Secondary | ICD-10-CM | POA: Diagnosis not present

## 2021-05-04 DIAGNOSIS — M6281 Muscle weakness (generalized): Secondary | ICD-10-CM | POA: Diagnosis not present

## 2021-05-04 DIAGNOSIS — Z741 Need for assistance with personal care: Secondary | ICD-10-CM | POA: Diagnosis not present

## 2021-05-04 DIAGNOSIS — M1712 Unilateral primary osteoarthritis, left knee: Secondary | ICD-10-CM | POA: Diagnosis not present

## 2021-05-05 DIAGNOSIS — I10 Essential (primary) hypertension: Secondary | ICD-10-CM | POA: Diagnosis not present

## 2021-05-05 DIAGNOSIS — M1711 Unilateral primary osteoarthritis, right knee: Secondary | ICD-10-CM | POA: Diagnosis not present

## 2021-05-05 DIAGNOSIS — M6281 Muscle weakness (generalized): Secondary | ICD-10-CM | POA: Diagnosis not present

## 2021-05-05 DIAGNOSIS — Z741 Need for assistance with personal care: Secondary | ICD-10-CM | POA: Diagnosis not present

## 2021-05-05 DIAGNOSIS — M1712 Unilateral primary osteoarthritis, left knee: Secondary | ICD-10-CM | POA: Diagnosis not present

## 2021-05-05 DIAGNOSIS — M6282 Rhabdomyolysis: Secondary | ICD-10-CM | POA: Diagnosis not present

## 2021-05-08 DIAGNOSIS — Z741 Need for assistance with personal care: Secondary | ICD-10-CM | POA: Diagnosis not present

## 2021-05-08 DIAGNOSIS — M1711 Unilateral primary osteoarthritis, right knee: Secondary | ICD-10-CM | POA: Diagnosis not present

## 2021-05-08 DIAGNOSIS — M1712 Unilateral primary osteoarthritis, left knee: Secondary | ICD-10-CM | POA: Diagnosis not present

## 2021-05-08 DIAGNOSIS — M6282 Rhabdomyolysis: Secondary | ICD-10-CM | POA: Diagnosis not present

## 2021-05-08 DIAGNOSIS — I10 Essential (primary) hypertension: Secondary | ICD-10-CM | POA: Diagnosis not present

## 2021-05-08 DIAGNOSIS — M6281 Muscle weakness (generalized): Secondary | ICD-10-CM | POA: Diagnosis not present

## 2021-05-10 DIAGNOSIS — R946 Abnormal results of thyroid function studies: Secondary | ICD-10-CM | POA: Diagnosis not present

## 2021-05-10 DIAGNOSIS — Z13228 Encounter for screening for other metabolic disorders: Secondary | ICD-10-CM | POA: Diagnosis not present

## 2021-05-10 DIAGNOSIS — E559 Vitamin D deficiency, unspecified: Secondary | ICD-10-CM | POA: Diagnosis not present

## 2021-05-10 DIAGNOSIS — E785 Hyperlipidemia, unspecified: Secondary | ICD-10-CM | POA: Diagnosis not present

## 2021-05-10 DIAGNOSIS — E550 Rickets, active: Secondary | ICD-10-CM | POA: Diagnosis not present

## 2021-05-11 DIAGNOSIS — M6282 Rhabdomyolysis: Secondary | ICD-10-CM | POA: Diagnosis not present

## 2021-05-11 DIAGNOSIS — M1711 Unilateral primary osteoarthritis, right knee: Secondary | ICD-10-CM | POA: Diagnosis not present

## 2021-05-11 DIAGNOSIS — I10 Essential (primary) hypertension: Secondary | ICD-10-CM | POA: Diagnosis not present

## 2021-05-11 DIAGNOSIS — Z741 Need for assistance with personal care: Secondary | ICD-10-CM | POA: Diagnosis not present

## 2021-05-11 DIAGNOSIS — M6281 Muscle weakness (generalized): Secondary | ICD-10-CM | POA: Diagnosis not present

## 2021-05-11 DIAGNOSIS — M1712 Unilateral primary osteoarthritis, left knee: Secondary | ICD-10-CM | POA: Diagnosis not present

## 2021-05-12 DIAGNOSIS — Z741 Need for assistance with personal care: Secondary | ICD-10-CM | POA: Diagnosis not present

## 2021-05-12 DIAGNOSIS — I10 Essential (primary) hypertension: Secondary | ICD-10-CM | POA: Diagnosis not present

## 2021-05-12 DIAGNOSIS — M1712 Unilateral primary osteoarthritis, left knee: Secondary | ICD-10-CM | POA: Diagnosis not present

## 2021-05-12 DIAGNOSIS — M1711 Unilateral primary osteoarthritis, right knee: Secondary | ICD-10-CM | POA: Diagnosis not present

## 2021-05-12 DIAGNOSIS — M6281 Muscle weakness (generalized): Secondary | ICD-10-CM | POA: Diagnosis not present

## 2021-05-12 DIAGNOSIS — M6282 Rhabdomyolysis: Secondary | ICD-10-CM | POA: Diagnosis not present

## 2021-05-16 DIAGNOSIS — Z741 Need for assistance with personal care: Secondary | ICD-10-CM | POA: Diagnosis not present

## 2021-05-16 DIAGNOSIS — M1712 Unilateral primary osteoarthritis, left knee: Secondary | ICD-10-CM | POA: Diagnosis not present

## 2021-05-16 DIAGNOSIS — M1711 Unilateral primary osteoarthritis, right knee: Secondary | ICD-10-CM | POA: Diagnosis not present

## 2021-05-16 DIAGNOSIS — M6281 Muscle weakness (generalized): Secondary | ICD-10-CM | POA: Diagnosis not present

## 2021-05-16 DIAGNOSIS — I10 Essential (primary) hypertension: Secondary | ICD-10-CM | POA: Diagnosis not present

## 2021-05-16 DIAGNOSIS — M6282 Rhabdomyolysis: Secondary | ICD-10-CM | POA: Diagnosis not present

## 2021-05-18 DIAGNOSIS — M6282 Rhabdomyolysis: Secondary | ICD-10-CM | POA: Diagnosis not present

## 2021-05-18 DIAGNOSIS — M6281 Muscle weakness (generalized): Secondary | ICD-10-CM | POA: Diagnosis not present

## 2021-05-18 DIAGNOSIS — I10 Essential (primary) hypertension: Secondary | ICD-10-CM | POA: Diagnosis not present

## 2021-05-18 DIAGNOSIS — Z741 Need for assistance with personal care: Secondary | ICD-10-CM | POA: Diagnosis not present

## 2021-05-18 DIAGNOSIS — M1711 Unilateral primary osteoarthritis, right knee: Secondary | ICD-10-CM | POA: Diagnosis not present

## 2021-05-18 DIAGNOSIS — M1712 Unilateral primary osteoarthritis, left knee: Secondary | ICD-10-CM | POA: Diagnosis not present

## 2021-05-19 DIAGNOSIS — F0390 Unspecified dementia without behavioral disturbance: Secondary | ICD-10-CM | POA: Diagnosis not present

## 2021-05-19 DIAGNOSIS — I10 Essential (primary) hypertension: Secondary | ICD-10-CM | POA: Diagnosis not present

## 2021-05-19 DIAGNOSIS — L89159 Pressure ulcer of sacral region, unspecified stage: Secondary | ICD-10-CM | POA: Diagnosis not present

## 2021-05-19 DIAGNOSIS — E46 Unspecified protein-calorie malnutrition: Secondary | ICD-10-CM | POA: Diagnosis not present

## 2021-05-22 DIAGNOSIS — Z741 Need for assistance with personal care: Secondary | ICD-10-CM | POA: Diagnosis not present

## 2021-05-22 DIAGNOSIS — M6282 Rhabdomyolysis: Secondary | ICD-10-CM | POA: Diagnosis not present

## 2021-05-22 DIAGNOSIS — M1711 Unilateral primary osteoarthritis, right knee: Secondary | ICD-10-CM | POA: Diagnosis not present

## 2021-05-22 DIAGNOSIS — I10 Essential (primary) hypertension: Secondary | ICD-10-CM | POA: Diagnosis not present

## 2021-05-22 DIAGNOSIS — M1712 Unilateral primary osteoarthritis, left knee: Secondary | ICD-10-CM | POA: Diagnosis not present

## 2021-05-22 DIAGNOSIS — M6281 Muscle weakness (generalized): Secondary | ICD-10-CM | POA: Diagnosis not present

## 2021-05-23 DIAGNOSIS — M1712 Unilateral primary osteoarthritis, left knee: Secondary | ICD-10-CM | POA: Diagnosis not present

## 2021-05-23 DIAGNOSIS — M6281 Muscle weakness (generalized): Secondary | ICD-10-CM | POA: Diagnosis not present

## 2021-05-23 DIAGNOSIS — Z741 Need for assistance with personal care: Secondary | ICD-10-CM | POA: Diagnosis not present

## 2021-05-23 DIAGNOSIS — F419 Anxiety disorder, unspecified: Secondary | ICD-10-CM | POA: Diagnosis not present

## 2021-05-23 DIAGNOSIS — F331 Major depressive disorder, recurrent, moderate: Secondary | ICD-10-CM | POA: Diagnosis not present

## 2021-05-23 DIAGNOSIS — G4700 Insomnia, unspecified: Secondary | ICD-10-CM | POA: Diagnosis not present

## 2021-05-23 DIAGNOSIS — M1711 Unilateral primary osteoarthritis, right knee: Secondary | ICD-10-CM | POA: Diagnosis not present

## 2021-05-23 DIAGNOSIS — I10 Essential (primary) hypertension: Secondary | ICD-10-CM | POA: Diagnosis not present

## 2021-05-23 DIAGNOSIS — M6282 Rhabdomyolysis: Secondary | ICD-10-CM | POA: Diagnosis not present

## 2021-05-26 DIAGNOSIS — M6281 Muscle weakness (generalized): Secondary | ICD-10-CM | POA: Diagnosis not present

## 2021-05-26 DIAGNOSIS — J309 Allergic rhinitis, unspecified: Secondary | ICD-10-CM | POA: Diagnosis not present

## 2021-05-26 DIAGNOSIS — E46 Unspecified protein-calorie malnutrition: Secondary | ICD-10-CM | POA: Diagnosis not present

## 2021-05-26 DIAGNOSIS — I10 Essential (primary) hypertension: Secondary | ICD-10-CM | POA: Diagnosis not present

## 2021-11-29 DEATH — deceased
